# Patient Record
Sex: Female | Born: 1960 | Race: Black or African American | Hispanic: No | Marital: Married | Smoking: Never smoker
Health system: Southern US, Community
[De-identification: ages and names within clinical notes are randomized; demographics above are authoritative.]

## PROBLEM LIST (undated history)

## (undated) DIAGNOSIS — M2550 Pain in unspecified joint: Secondary | ICD-10-CM

## (undated) DIAGNOSIS — F419 Anxiety disorder, unspecified: Secondary | ICD-10-CM

## (undated) DIAGNOSIS — E119 Type 2 diabetes mellitus without complications: Secondary | ICD-10-CM

## (undated) DIAGNOSIS — M7989 Other specified soft tissue disorders: Secondary | ICD-10-CM

## (undated) DIAGNOSIS — G4730 Sleep apnea, unspecified: Secondary | ICD-10-CM

## (undated) DIAGNOSIS — M797 Fibromyalgia: Secondary | ICD-10-CM

## (undated) DIAGNOSIS — M51369 Other intervertebral disc degeneration, lumbar region without mention of lumbar back pain or lower extremity pain: Secondary | ICD-10-CM

## (undated) DIAGNOSIS — G4700 Insomnia, unspecified: Secondary | ICD-10-CM

## (undated) DIAGNOSIS — M1990 Unspecified osteoarthritis, unspecified site: Secondary | ICD-10-CM

## (undated) DIAGNOSIS — M549 Dorsalgia, unspecified: Secondary | ICD-10-CM

## (undated) DIAGNOSIS — Z973 Presence of spectacles and contact lenses: Secondary | ICD-10-CM

## (undated) DIAGNOSIS — I890 Lymphedema, not elsewhere classified: Secondary | ICD-10-CM

## (undated) DIAGNOSIS — K219 Gastro-esophageal reflux disease without esophagitis: Secondary | ICD-10-CM

## (undated) DIAGNOSIS — M5126 Other intervertebral disc displacement, lumbar region: Secondary | ICD-10-CM

## (undated) DIAGNOSIS — I10 Essential (primary) hypertension: Secondary | ICD-10-CM

## (undated) DIAGNOSIS — M4800 Spinal stenosis, site unspecified: Secondary | ICD-10-CM

## (undated) DIAGNOSIS — R002 Palpitations: Secondary | ICD-10-CM

## (undated) DIAGNOSIS — M5136 Other intervertebral disc degeneration, lumbar region: Secondary | ICD-10-CM

## (undated) HISTORY — DX: Dorsalgia, unspecified: M54.9

## (undated) HISTORY — PX: MRI: SHX5353

## (undated) HISTORY — DX: Other specified soft tissue disorders: M79.89

## (undated) HISTORY — DX: Other intervertebral disc degeneration, lumbar region without mention of lumbar back pain or lower extremity pain: M51.369

## (undated) HISTORY — DX: Other intervertebral disc displacement, lumbar region: M51.26

## (undated) HISTORY — PX: OTHER SURGICAL HISTORY: SHX169

## (undated) HISTORY — DX: Lymphedema, not elsewhere classified: I89.0

## (undated) HISTORY — DX: Pain in unspecified joint: M25.50

## (undated) HISTORY — PX: ABDOMINAL HYSTERECTOMY: SHX81

## (undated) HISTORY — PX: LUMBAR LAMINECTOMY: SHX95

## (undated) HISTORY — DX: Spinal stenosis, site unspecified: M48.00

## (undated) HISTORY — PX: GASTRIC BYPASS: SHX52

## (undated) HISTORY — DX: Other intervertebral disc degeneration, lumbar region: M51.36

## (undated) HISTORY — PX: ANAL FISSURE REPAIR: SHX2312

---

## 1999-06-03 ENCOUNTER — Encounter: Admission: RE | Admit: 1999-06-03 | Discharge: 1999-09-01 | Payer: Self-pay | Admitting: *Deleted

## 2000-03-15 ENCOUNTER — Encounter: Admission: RE | Admit: 2000-03-15 | Discharge: 2000-06-13 | Payer: Self-pay | Admitting: Surgical Oncology

## 2000-08-21 ENCOUNTER — Encounter: Admission: RE | Admit: 2000-08-21 | Discharge: 2000-11-19 | Payer: Self-pay | Admitting: Surgical Oncology

## 2003-04-04 ENCOUNTER — Ambulatory Visit (HOSPITAL_COMMUNITY): Admission: RE | Admit: 2003-04-04 | Discharge: 2003-04-04 | Payer: Self-pay | Admitting: General Surgery

## 2003-10-30 ENCOUNTER — Ambulatory Visit (HOSPITAL_COMMUNITY): Admission: RE | Admit: 2003-10-30 | Discharge: 2003-10-30 | Payer: Self-pay | Admitting: General Surgery

## 2006-10-03 ENCOUNTER — Encounter: Admission: RE | Admit: 2006-10-03 | Discharge: 2006-10-03 | Payer: Self-pay | Admitting: Internal Medicine

## 2007-02-01 HISTORY — PX: BUNIONECTOMY: SHX129

## 2007-04-27 ENCOUNTER — Encounter: Admission: RE | Admit: 2007-04-27 | Discharge: 2007-04-27 | Payer: Self-pay | Admitting: Neurosurgery

## 2010-06-18 NOTE — Op Note (Signed)
Angel Watkins, Angel Watkins     ACCOUNT NO.:  [id number]   MEDICAL RECORD NO.:  [id number]          PATIENT TYPE:  AMB   LOCATION:  DAY                          FACILITY:  [facility]   PHYSICIAN:  Ollen Gross. Vernell Morgans, M.D. DATE OF BIRTH:  04-Dec-1960   DATE OF PROCEDURE:  10/30/2003  DATE OF DISCHARGE:  10/30/2003                                 OPERATIVE REPORT   PREOPERATIVE DIAGNOSIS:  Persistent anal fistula.   POSTOPERATIVE DIAGNOSIS:  Persistent anal fistula.   PROCEDURE:  Examination under anesthesia, fistulotomy and placement of new  seaton.   SURGEON:  Ollen Gross. Carolynne Edouard, M.D.   ANESTHESIA:  General endotracheal.   DESCRIPTION OF PROCEDURE:  After informed consent was obtained, the patient  was brought to the operating room, placed in the supine position on the  operating table. After adequate induction of general anesthesia, the patient  was placed in the lithotomy position and the perirectal area was prepped  with Betadine, draped in the usual sterile manner.   The rectum was examined with an anoscope.  There were a couple of small  fissures that were able to be opened sharply with the electrocautery.  The  old seaton was still in place. It appeared as though the seaton was still  surrounding a small portion of the internal muscle, but there was scar  tissue that made it difficult to determine this exactly.  Because of this,  the fistula tracts were cleaned up with the cautery and a curet.  A new  seaton using a blue rubber [place] was placed snugly around what was left  of the fistula tract and anchored at that point with a 0-silk tie. The area  was then infiltrated with 0.25% Marcaine and sterile dressings were applied.  The patient tolerated the procedure well. At the end of the case, all  sponge, instrument and needle counts were correct.  The patient was then  awakened and taken to the recovery room in stable condition.      PST/MEDQ  D:  11/02/2003  T:  11/02/2003   Job:  [id number]

## 2010-06-18 NOTE — Op Note (Signed)
[name], Angel Watkins                 ACCOUNT NO.:  [id number]   MEDICAL RECORD NO.:  [id number]                   PATIENT TYPE:  AMB   LOCATION:  DAY                                  FACILITY:  [facility]   PHYSICIAN:  Ollen Gross. Vernell Morgans, M.D.              DATE OF BIRTH:  02-Oct-1960   DATE OF PROCEDURE:  04/04/2003  DATE OF DISCHARGE:  04/04/2003                                 OPERATIVE REPORT   PREOPERATIVE DIAGNOSIS:  Anal fistula.   POSTOPERATIVE DIAGNOSIS:  Anal fistula.   PROCEDURE:  1. Exam under anesthesia.  2. Debridement of fistula tract.  3. Placement of seton.   SURGEON:  Dr. Carolynne Edouard.   ANESTHESIA:  General endotracheal.   DESCRIPTION OF PROCEDURE:  After informed consent was obtained, the patient  was brought to the operating room and placed in the supine position on the  operating room table.  After adequate induction of general anesthesia, the  patient is placed in lithotomy position.  Her perirectal area was prepped  with Betadine and draped in the usual sterile manner.  There was an opening  posteriorly and a palpable cord.  This tract was probed with a silver probe,  and a connection with the rectum was able to be appreciated.  This fistula  tract did seem to go fairly deep to the sphincter muscles.  The opening on  the skin was widened sharply with a 15 blade knife and electrocautery.  The  fistula tract was curetted to remove any epithelial tissue.  A looped #1  Prolene was then placed around the fistula tract and tied down, and the  tails were cut long to leave the ability to pull on these stitches down the  road.  Hemostasis was achieved using the electrocautery.  No other  abnormalities were identified.  The area was then infiltrated with 0.25%  Marcaine with epinephrine.  Gelfoam was placed in the rectum, and sterile  dressings were applied.  The patient tolerated the procedure well.  At the  end of the case, all needle, sponge, and instrument counts  were correct.  The patient was then awakened and taken to the recovery room in stable  condition.                                               Ollen Gross. Vernell Morgans, M.D.    PST/MEDQ  D:  04/06/2003  T:  04/06/2003  Job:  [id number]

## 2014-05-05 ENCOUNTER — Other Ambulatory Visit: Payer: Self-pay | Admitting: Neurosurgery

## 2014-05-05 DIAGNOSIS — M5412 Radiculopathy, cervical region: Secondary | ICD-10-CM

## 2014-05-08 ENCOUNTER — Ambulatory Visit
Admission: RE | Admit: 2014-05-08 | Discharge: 2014-05-08 | Disposition: A | Discharge status: Home / Self Care | Payer: Self-pay | Source: Ambulatory Visit | Attending: Neurosurgery | Admitting: Neurosurgery

## 2014-05-08 ENCOUNTER — Ambulatory Visit
Admission: RE | Admit: 2014-05-08 | Discharge: 2014-05-08 | Disposition: A | Discharge status: Home / Self Care | Payer: 59 | Source: Ambulatory Visit | Attending: Neurosurgery | Admitting: Neurosurgery

## 2014-05-08 DIAGNOSIS — M5412 Radiculopathy, cervical region: Secondary | ICD-10-CM

## 2014-05-08 MED ORDER — IOHEXOL 300 MG/ML  SOLN
10.0000 mL | Freq: Once | INTRAMUSCULAR | Status: AC | PRN
  Administered 2014-05-08: 10 mL via INTRATHECAL

## 2014-05-08 MED ORDER — DIAZEPAM 5 MG PO TABS
5.0000 mg | ORAL_TABLET | Freq: Once | ORAL | Status: AC
  Administered 2014-05-08: 5 mg via ORAL

## 2014-05-08 NOTE — Discharge Instructions (Signed)

## 2014-06-01 ENCOUNTER — Emergency Department (HOSPITAL_BASED_OUTPATIENT_CLINIC_OR_DEPARTMENT_OTHER)
Admission: EM | Admit: 2014-06-01 | Discharge: 2014-06-01 | Disposition: A | Discharge status: Home / Self Care | Payer: 59 | Attending: Emergency Medicine | Admitting: Emergency Medicine

## 2014-06-01 ENCOUNTER — Encounter (HOSPITAL_BASED_OUTPATIENT_CLINIC_OR_DEPARTMENT_OTHER): Payer: Self-pay | Admitting: *Deleted

## 2014-06-01 ENCOUNTER — Emergency Department (HOSPITAL_BASED_OUTPATIENT_CLINIC_OR_DEPARTMENT_OTHER): Payer: 59

## 2014-06-01 DIAGNOSIS — S0990XA Unspecified injury of head, initial encounter: Secondary | ICD-10-CM | POA: Insufficient documentation

## 2014-06-01 DIAGNOSIS — I10 Essential (primary) hypertension: Secondary | ICD-10-CM | POA: Insufficient documentation

## 2014-06-01 DIAGNOSIS — S59912A Unspecified injury of left forearm, initial encounter: Secondary | ICD-10-CM | POA: Diagnosis not present

## 2014-06-01 DIAGNOSIS — Z79899 Other long term (current) drug therapy: Secondary | ICD-10-CM | POA: Diagnosis not present

## 2014-06-01 DIAGNOSIS — S39012A Strain of muscle, fascia and tendon of lower back, initial encounter: Secondary | ICD-10-CM

## 2014-06-01 DIAGNOSIS — S199XXA Unspecified injury of neck, initial encounter: Secondary | ICD-10-CM | POA: Diagnosis present

## 2014-06-01 DIAGNOSIS — Y998 Other external cause status: Secondary | ICD-10-CM | POA: Diagnosis not present

## 2014-06-01 DIAGNOSIS — Z791 Long term (current) use of non-steroidal anti-inflammatories (NSAID): Secondary | ICD-10-CM | POA: Diagnosis not present

## 2014-06-01 DIAGNOSIS — Z8669 Personal history of other diseases of the nervous system and sense organs: Secondary | ICD-10-CM | POA: Insufficient documentation

## 2014-06-01 DIAGNOSIS — Z9889 Other specified postprocedural states: Secondary | ICD-10-CM | POA: Diagnosis not present

## 2014-06-01 DIAGNOSIS — S4992XA Unspecified injury of left shoulder and upper arm, initial encounter: Secondary | ICD-10-CM | POA: Insufficient documentation

## 2014-06-01 DIAGNOSIS — S161XXA Strain of muscle, fascia and tendon at neck level, initial encounter: Secondary | ICD-10-CM | POA: Insufficient documentation

## 2014-06-01 DIAGNOSIS — S8990XA Unspecified injury of unspecified lower leg, initial encounter: Secondary | ICD-10-CM | POA: Insufficient documentation

## 2014-06-01 DIAGNOSIS — Y92410 Unspecified street and highway as the place of occurrence of the external cause: Secondary | ICD-10-CM | POA: Diagnosis not present

## 2014-06-01 DIAGNOSIS — Z880 Allergy status to penicillin: Secondary | ICD-10-CM | POA: Insufficient documentation

## 2014-06-01 DIAGNOSIS — Y9389 Activity, other specified: Secondary | ICD-10-CM | POA: Diagnosis not present

## 2014-06-01 DIAGNOSIS — E119 Type 2 diabetes mellitus without complications: Secondary | ICD-10-CM | POA: Diagnosis not present

## 2014-06-01 HISTORY — DX: Sleep apnea, unspecified: G47.30

## 2014-06-01 HISTORY — DX: Essential (primary) hypertension: I10

## 2014-06-01 HISTORY — DX: Type 2 diabetes mellitus without complications: E11.9

## 2014-06-01 NOTE — ED Notes (Signed)
MD at bedside. 
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
## 2014-06-01 NOTE — ED Notes (Signed)
Patient transported to CT 

## 2014-06-01 NOTE — Discharge Instructions (Signed)
Seizure pain patch as needed. Follow-up with your neurosurgeon if symptoms don't improve over the next few days. Results of the head CT neck CT and CT of the lumbar back without any acute injuries. From a bony standpoint.
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
## 2017-10-09 ENCOUNTER — Other Ambulatory Visit (HOSPITAL_COMMUNITY): Payer: Self-pay | Admitting: Neurological Surgery

## 2017-10-09 DIAGNOSIS — G959 Disease of spinal cord, unspecified: Secondary | ICD-10-CM

## 2017-10-09 DIAGNOSIS — M5416 Radiculopathy, lumbar region: Secondary | ICD-10-CM

## 2017-11-09 ENCOUNTER — Ambulatory Visit (HOSPITAL_COMMUNITY): Payer: 59

## 2017-11-22 NOTE — Progress Notes (Signed)
Left voice message with Shanda Bumps, Surgical Coordinator, to make MD aware that a new H& P is needed ( previous H&P scanned into media is outdated).

## 2017-11-23 ENCOUNTER — Ambulatory Visit (HOSPITAL_COMMUNITY): Payer: 59

## 2017-12-13 ENCOUNTER — Other Ambulatory Visit: Payer: Self-pay

## 2017-12-13 ENCOUNTER — Encounter (HOSPITAL_COMMUNITY): Payer: Self-pay | Admitting: *Deleted

## 2017-12-13 NOTE — Progress Notes (Signed)
Pre-op phone call complete.  Pt denies any CP, SOB, fever or cough.  Does not see a cardiologist.  States last Hbg A1C was 6.2 in September.  Fasting CBG usually 112-120.  Instructed to check CBG in AM, if <70 treat with 1/2 cup of clear juice (apple or cranberry), glucose gel or tablets, and then recheck after 15 minutes. No meds tomorrow morning, NPO after MN.  Verbalized understanding of above.

## 2017-12-14 ENCOUNTER — Ambulatory Visit (HOSPITAL_COMMUNITY): Payer: 59 | Admitting: Certified Registered Nurse Anesthetist

## 2017-12-14 ENCOUNTER — Ambulatory Visit (HOSPITAL_COMMUNITY)
Admission: RE | Admit: 2017-12-14 | Discharge: 2017-12-14 | Disposition: A | Discharge status: Home / Self Care | Payer: 59 | Source: Ambulatory Visit | Attending: Neurological Surgery | Admitting: Neurological Surgery

## 2017-12-14 ENCOUNTER — Encounter (HOSPITAL_COMMUNITY): Admission: RE | Disposition: A | Discharge status: Home / Self Care | Payer: Self-pay | Source: Ambulatory Visit

## 2017-12-14 DIAGNOSIS — M5416 Radiculopathy, lumbar region: Secondary | ICD-10-CM

## 2017-12-14 DIAGNOSIS — G959 Disease of spinal cord, unspecified: Secondary | ICD-10-CM

## 2017-12-14 DIAGNOSIS — M48061 Spinal stenosis, lumbar region without neurogenic claudication: Secondary | ICD-10-CM | POA: Insufficient documentation

## 2017-12-14 DIAGNOSIS — M4802 Spinal stenosis, cervical region: Secondary | ICD-10-CM | POA: Diagnosis not present

## 2017-12-14 DIAGNOSIS — M5003 Cervical disc disorder with myelopathy, cervicothoracic region: Secondary | ICD-10-CM | POA: Insufficient documentation

## 2017-12-14 DIAGNOSIS — M5116 Intervertebral disc disorders with radiculopathy, lumbar region: Secondary | ICD-10-CM | POA: Insufficient documentation

## 2017-12-14 DIAGNOSIS — M2578 Osteophyte, vertebrae: Secondary | ICD-10-CM | POA: Diagnosis not present

## 2017-12-14 HISTORY — DX: Anxiety disorder, unspecified: F41.9

## 2017-12-14 HISTORY — PX: RADIOLOGY WITH ANESTHESIA: SHX6223

## 2017-12-14 HISTORY — DX: Unspecified osteoarthritis, unspecified site: M19.90

## 2017-12-14 LAB — BASIC METABOLIC PANEL
Anion gap: 8 (ref 5–15)
BUN: 14 mg/dL (ref 6–20)
CHLORIDE: 106 mmol/L (ref 98–111)
CO2: 25 mmol/L (ref 22–32)
CREATININE: 0.84 mg/dL (ref 0.44–1.00)
Calcium: 8.9 mg/dL (ref 8.9–10.3)
Glucose, Bld: 131 mg/dL — ABNORMAL HIGH (ref 70–99)
Potassium: 3.5 mmol/L (ref 3.5–5.1)
SODIUM: 139 mmol/L (ref 135–145)

## 2017-12-14 LAB — CBC
HCT: 42.8 % (ref 36.0–46.0)
Hemoglobin: 12.8 g/dL (ref 12.0–15.0)
MCH: 28.5 pg (ref 26.0–34.0)
MCHC: 29.9 g/dL — AB (ref 30.0–36.0)
MCV: 95.3 fL (ref 80.0–100.0)
NRBC: 0 % (ref 0.0–0.2)
PLATELETS: 315 10*3/uL (ref 150–400)
RBC: 4.49 MIL/uL (ref 3.87–5.11)
RDW: 12.3 % (ref 11.5–15.5)
WBC: 5.4 10*3/uL (ref 4.0–10.5)

## 2017-12-14 LAB — GLUCOSE, CAPILLARY
GLUCOSE-CAPILLARY: 107 mg/dL — AB (ref 70–99)
GLUCOSE-CAPILLARY: 104 mg/dL — AB (ref 70–99)

## 2017-12-14 SURGERY — MRI WITH ANESTHESIA
Anesthesia: General

## 2017-12-14 MED ORDER — LACTATED RINGERS IV SOLN
INTRAVENOUS | Status: DC
  Administered 2017-12-14: 08:00:00 via INTRAVENOUS

## 2017-12-14 MED ORDER — ROCURONIUM BROMIDE 50 MG/5ML IV SOSY
PREFILLED_SYRINGE | INTRAVENOUS | Status: DC | PRN
  Administered 2017-12-14: 50 mg via INTRAVENOUS

## 2017-12-14 MED ORDER — DEXAMETHASONE SODIUM PHOSPHATE 10 MG/ML IJ SOLN
INTRAMUSCULAR | Status: DC | PRN
  Administered 2017-12-14: 10 mg via INTRAVENOUS

## 2017-12-14 MED ORDER — SUGAMMADEX SODIUM 200 MG/2ML IV SOLN
INTRAVENOUS | Status: DC | PRN
  Administered 2017-12-14: 300 mg via INTRAVENOUS

## 2017-12-14 MED ORDER — FENTANYL CITRATE (PF) 250 MCG/5ML IJ SOLN
INTRAMUSCULAR | Status: DC | PRN
  Administered 2017-12-14: 100 ug via INTRAVENOUS

## 2017-12-14 MED ORDER — MIDAZOLAM HCL 2 MG/2ML IJ SOLN
INTRAMUSCULAR | Status: DC | PRN
  Administered 2017-12-14 (×2): 2 mg via INTRAVENOUS

## 2017-12-14 MED ORDER — ONDANSETRON HCL 4 MG/2ML IJ SOLN
INTRAMUSCULAR | Status: DC | PRN
  Administered 2017-12-14: 4 mg via INTRAVENOUS

## 2017-12-14 MED ORDER — PROPOFOL 10 MG/ML IV BOLUS
INTRAVENOUS | Status: DC | PRN
  Administered 2017-12-14: 120 mg via INTRAVENOUS

## 2017-12-14 NOTE — Transfer of Care (Signed)
Immediate Anesthesia Transfer of Care Note  Patient: Angel Watkins  Procedure(s) Performed: MRI WITH ANESTHESIA, LUMBAR SPINE WITHOUT CONTRAST, CERVICAL SPINE WITHOUT CONTRAST (N/A )  Patient Location: PACU  Anesthesia Type:General  Level of Consciousness: awake, alert  and oriented  Airway & Oxygen Therapy: Patient Spontanous Breathing and Patient connected to face mask oxygen  Post-op Assessment: Report given to RN, Post -op Vital signs reviewed and stable and Patient moving all extremities X 4  Post vital signs: Reviewed and stable  Last Vitals:  Vitals Value Taken Time  BP 112/63 12/14/2017 11:50 AM  Temp 36.4 C 12/14/2017 11:50 AM  Pulse 66 12/14/2017 11:54 AM  Resp 13 12/14/2017 11:54 AM  SpO2 95 % 12/14/2017 11:54 AM  Vitals shown include unvalidated device data.  Last Pain:  Vitals:   12/14/17 1150  TempSrc:   PainSc: 0-No pain         Complications: No apparent anesthesia complications

## 2017-12-14 NOTE — Anesthesia Procedure Notes (Signed)
Procedure Name: Intubation Date/Time: 12/14/2017 10:31 AM Performed by: Kathryne Hitch, CRNA Pre-anesthesia Checklist: Patient identified, Emergency Drugs available, Suction available, Patient being monitored and Timeout performed Patient Re-evaluated:Patient Re-evaluated prior to induction Oxygen Delivery Method: Circle system utilized Preoxygenation: Pre-oxygenation with 100% oxygen Induction Type: IV induction Ventilation: Oral airway inserted - appropriate to patient size and Two handed mask ventilation required Laryngoscope Size: Mac and 4 Grade View: Grade II Tube type: Oral Tube size: 7.0 mm Number of attempts: 1 Airway Equipment and Method: Stylet Placement Confirmation: ETT inserted through vocal cords under direct vision,  positive ETCO2 and breath sounds checked- equal and bilateral Secured at: 21 cm Tube secured with: Tape Dental Injury: Teeth and Oropharynx as per pre-operative assessment

## 2017-12-14 NOTE — Anesthesia Preprocedure Evaluation (Signed)
Anesthesia Evaluation  Patient identified by MRN, date of birth, ID band Patient awake    Reviewed: Allergy & Precautions, NPO status , Patient's Chart, lab work & pertinent test results  History of Anesthesia Complications Negative for: history of anesthetic complications  Airway Mallampati: II  TM Distance: >3 FB Neck ROM: Full    Dental  (+) Teeth Intact   Pulmonary neg shortness of breath, sleep apnea , neg COPD, neg recent URI,    breath sounds clear to auscultation       Cardiovascular hypertension, Pt. on medications (-) angina(-) Past MI and (-) CHF  Rhythm:Regular     Neuro/Psych PSYCHIATRIC DISORDERS Anxiety negative neurological ROS     GI/Hepatic negative GI ROS, Neg liver ROS,   Endo/Other  diabetes, Type 2, Oral Hypoglycemic AgentsMorbid obesity  Renal/GU negative Renal ROS     Musculoskeletal  (+) Arthritis ,   Abdominal   Peds  Hematology negative hematology ROS (+)   Anesthesia Other Findings   Reproductive/Obstetrics                             Anesthesia Physical Anesthesia Plan  ASA: II  Anesthesia Plan: General   Post-op Pain Management:    Induction: Intravenous  PONV Risk Score and Plan: 3 and Ondansetron and Dexamethasone  Airway Management Planned: LMA and Oral ETT  Additional Equipment: None  Intra-op Plan:   Post-operative Plan: Extubation in OR  Informed Consent: I have reviewed the patients History and Physical, chart, labs and discussed the procedure including the risks, benefits and alternatives for the proposed anesthesia with the patient or authorized representative who has indicated his/her understanding and acceptance.   Dental advisory given  Plan Discussed with: CRNA and Surgeon  Anesthesia Plan Comments:         Anesthesia Quick Evaluation

## 2017-12-15 ENCOUNTER — Encounter (HOSPITAL_COMMUNITY): Payer: Self-pay | Admitting: Radiology

## 2017-12-18 NOTE — Anesthesia Postprocedure Evaluation (Signed)
Anesthesia Post Note  Patient: Angel Watkins  Procedure(s) Performed: MRI WITH ANESTHESIA, LUMBAR SPINE WITHOUT CONTRAST, CERVICAL SPINE WITHOUT CONTRAST (N/A )     Patient location during evaluation: PACU Anesthesia Type: General Level of consciousness: awake and alert Pain management: pain level controlled Vital Signs Assessment: post-procedure vital signs reviewed and stable Respiratory status: spontaneous breathing, nonlabored ventilation, respiratory function stable and patient connected to nasal cannula oxygen Cardiovascular status: blood pressure returned to baseline and stable Postop Assessment: no apparent nausea or vomiting Anesthetic complications: no    Last Vitals:  Vitals:   12/14/17 1234 12/14/17 1249  BP: (!) 99/58 (!) 101/55  Pulse: 62 60  Resp: 13 18  Temp:  (!) 36.4 C  SpO2: 98% 97%    Last Pain:  Vitals:   12/14/17 1249  TempSrc:   PainSc: 0-No pain                 Mette Southgate

## 2018-09-21 HISTORY — PX: COLONOSCOPY: SHX174

## 2018-10-31 ENCOUNTER — Other Ambulatory Visit: Payer: Self-pay | Admitting: Neurological Surgery

## 2018-11-28 NOTE — Pre-Procedure Instructions (Signed)
[facility] STORE [place], [address] [place] AT [address] [place] [address] Phone: [phone] Fax: [phone]    Your procedure is scheduled on Mon., Nov. 2, 2020 from 8:00AM-10:33AM  Report to [facility] Entrance "A" at 6:00AM  Call this number if you have problems the morning of surgery:  [phone]   Remember:  Do not eat or drink after midnight on Nov. 1st    Take these medicines the morning of surgery with A SIP OF WATER: Atorvastatin (LIPITOR) Zonisamide (ZONEGRAN)   As of today, stop taking all Aspirin (unless instructed by your doctor) and Other Aspirin containing products, Vitamins, Fish oils, and Herbal medications. Also stop all NSAIDS i.e. Advil, Ibuprofen, Motrin, Aleve, Anaprox, Naproxen, BC, Goody Powders, and all Supplements.   . Do not take MetFORMIN (GLUCOPHAGE)  the morning of surgery.  How to Manage Your Diabetes Before and After Surgery  Why is it important to control my blood sugar before and after surgery? . Improving blood sugar levels before and after surgery helps healing and can limit problems. . A way of improving blood sugar control is eating a healthy diet by: o  Eating less sugar and carbohydrates o  Increasing activity/exercise o  Talking with your doctor about reaching your blood sugar goals . High blood sugars (greater than 180 mg/dL) can raise your risk of infections and slow your recovery, so you will need to focus on controlling your diabetes during the weeks before surgery. . Make sure that the doctor who takes care of your diabetes knows about your planned surgery including the date and location.  How do I manage my blood sugar before surgery? . Check your blood sugar at least 4 times a day, starting 2 days before surgery, to make sure that the level is not too high or low. o Check your blood sugar the morning of your surgery when you wake up and every 2 hours until you get to the  Short Stay unit. . If your blood sugar is less than 70 mg/dL, you will need to treat for low blood sugar: o Do not take insulin. o Treat a low blood sugar (less than 70 mg/dL) with  cup of clear juice (cranberry or apple), 4 glucose tablets, OR glucose gel. Recheck blood sugar in 15 minutes after treatment (to make sure it is greater than 70 mg/dL). If your blood sugar is not greater than 70 mg/dL on recheck, call [phone] o  for further instructions. . If your CBG is greater than 220 mg/dL, inform the staff upon arrival to Short Stay.  . If you are admitted to the hospital after surgery: o Your blood sugar will be checked by the staff and you will probably be given insulin after surgery (instead of oral diabetes medicines) to make sure you have good blood sugar levels. o The goal for blood sugar control after surgery is 80-180 mg/dL.  Reviewed and Endorsed by [facility] Patient Education Committee, August 2015  No Smoking of any kind, Tobacco, or Alcohol products 24 hours prior to your procedure. If you use a Cpap at night, you may bring all equipment with you the day of surgery.    Special instructions:  [place]- Preparing For Surgery  Before surgery, you can play an important role. Because skin is not sterile, your skin needs to be as free of germs as possible. You can reduce the number of germs on your skin  by washing with CHG (chlorahexidine gluconate) Soap before surgery.  CHG is an antiseptic cleaner which kills germs and bonds with the skin to continue killing germs even after washing.    Oral Hygiene is also important to reduce your risk of infection.  Remember - BRUSH YOUR TEETH THE MORNING OF SURGERY WITH YOUR REGULAR TOOTHPASTE  Please do not use if you have an allergy to CHG or antibacterial soaps. If your skin becomes reddened/irritated stop using the CHG.  Do not shave (including legs and underarms) for at least 48 hours prior to first CHG shower. It is OK to shave  your face.  Please follow these instructions carefully.   1. Shower the NIGHT BEFORE SURGERY and the MORNING OF SURGERY with CHG.   2. If you chose to wash your hair, wash your hair first as usual with your normal shampoo.  3. After you shampoo, rinse your hair and body thoroughly to remove the shampoo.  4. Use CHG as you would any other liquid soap. You can apply CHG directly to the skin and wash gently with a scrungie or a clean washcloth.   5. Apply the CHG Soap to your body ONLY FROM THE NECK DOWN.  Do not use on open wounds or open sores. Avoid contact with your eyes, ears, mouth and genitals (private parts). Wash Face and genitals (private parts)  with your normal soap.  6. Wash thoroughly, paying special attention to the area where your surgery will be performed.  7. Thoroughly rinse your body with warm water from the neck down.  8. DO NOT shower/wash with your normal soap after using and rinsing off the CHG Soap.  9. Pat yourself dry with a CLEAN TOWEL.  10. Wear CLEAN PAJAMAS to bed the night before surgery, wear comfortable clothes the morning of surgery  11. Place CLEAN SHEETS on your bed the night of your first shower and DO NOT SLEEP WITH PETS.   Day of Surgery:             Remember to brush your teeth WITH YOUR REGULAR TOOTHPASTE.   Do not wear jewelry, make-up or nail polish.  Do not wear lotions, powders, or perfumes, or deodorant.  Do not shave 48 hours prior to surgery.    Do not bring valuables to the hospital.  [facility] is not responsible for any belongings or valuables.  Contacts, dentures or bridgework may not be worn into surgery.    For patients admitted to the hospital, discharge time will be determined by your treatment team.  Patients discharged the day of surgery will not be allowed to drive home, and someone age 56 and over needs to stay with them for 24 hours.  Please wear clean clothes to the hospital/surgery center.    Please read over the  following fact sheets that you were given.

## 2018-11-29 ENCOUNTER — Encounter (HOSPITAL_COMMUNITY): Payer: Self-pay

## 2018-11-29 ENCOUNTER — Other Ambulatory Visit (HOSPITAL_COMMUNITY)
Admission: RE | Admit: 2018-11-29 | Discharge: 2018-11-29 | Disposition: A | Discharge status: Home / Self Care | Payer: 59 | Source: Ambulatory Visit | Attending: Neurological Surgery | Admitting: Neurological Surgery

## 2018-11-29 ENCOUNTER — Other Ambulatory Visit: Payer: Self-pay

## 2018-11-29 ENCOUNTER — Encounter (HOSPITAL_COMMUNITY)
Admission: RE | Admit: 2018-11-29 | Discharge: 2018-11-29 | Disposition: A | Discharge status: Home / Self Care | Payer: 59 | Source: Ambulatory Visit | Attending: Neurological Surgery | Admitting: Neurological Surgery

## 2018-11-29 DIAGNOSIS — Z20828 Contact with and (suspected) exposure to other viral communicable diseases: Secondary | ICD-10-CM | POA: Diagnosis not present

## 2018-11-29 DIAGNOSIS — I10 Essential (primary) hypertension: Secondary | ICD-10-CM | POA: Diagnosis not present

## 2018-11-29 DIAGNOSIS — Z01818 Encounter for other preprocedural examination: Secondary | ICD-10-CM | POA: Insufficient documentation

## 2018-11-29 HISTORY — DX: Fibromyalgia: M79.7

## 2018-11-29 LAB — BASIC METABOLIC PANEL
Anion gap: 9 (ref 5–15)
BUN: 10 mg/dL (ref 6–20)
CO2: 25 mmol/L (ref 22–32)
Calcium: 9.2 mg/dL (ref 8.9–10.3)
Chloride: 105 mmol/L (ref 98–111)
Creatinine, Ser: 0.81 mg/dL (ref 0.44–1.00)
GFR calc Af Amer: >60 mL/min (ref >60)
GFR calc non Af Amer: >60 mL/min (ref >60)
Glucose, Bld: 147 mg/dL — ABNORMAL HIGH (ref 70–99)
Potassium: 4 mmol/L (ref 3.5–5.1)
Sodium: 139 mmol/L (ref 135–145)

## 2018-11-29 LAB — CBC
HCT: 42.8 % (ref 36.0–46.0)
Hemoglobin: 13.5 g/dL (ref 12.0–15.0)
MCH: 30.1 pg (ref 26.0–34.0)
MCHC: 31.5 g/dL (ref 30.0–36.0)
MCV: 95.3 fL (ref 80.0–100.0)
Platelets: 287 10*3/uL (ref 150–400)
RBC: 4.49 MIL/uL (ref 3.87–5.11)
RDW: 12.4 % (ref 11.5–15.5)
WBC: 5.2 10*3/uL (ref 4.0–10.5)
nRBC: 0 % (ref 0.0–0.2)

## 2018-11-29 LAB — SURGICAL PCR SCREEN
MRSA, PCR: NEGATIVE
Staphylococcus aureus: NEGATIVE

## 2018-11-29 LAB — GLUCOSE, CAPILLARY: Glucose-Capillary: 140 mg/dL — ABNORMAL HIGH (ref 70–99)

## 2018-11-29 LAB — HEMOGLOBIN A1C
Hgb A1c MFr Bld: 6.2 % — ABNORMAL HIGH (ref 4.8–5.6)
Mean Plasma Glucose: 131.24 mg/dL

## 2018-11-29 LAB — TYPE AND SCREEN
ABO/RH(D): B POS
Antibody Screen: NEGATIVE

## 2018-11-29 LAB — ABO/RH: ABO/RH(D): B POS

## 2018-11-29 NOTE — Progress Notes (Signed)
PCP:  Sofie Rower, FNP Cardiologist: Denies  EKG:  11/29/18 CXR:  NA ECHO: denies Stress Test:  denies Cardiac Cath:  denies  Fasting Blood Sugar-  Patient does not check BG at home.  Pt states she does not know her BG range. Checks Blood Sugar__0_ times a day  Sleep Apnea:  Yes CPAP:  Yes.  Informed patient to bring CPAP DOS.  Covid testing scheduled 11/29/18  Patient denies shortness of breath, fever, cough, and chest pain at PAT appointment.  Patient verbalized understanding of instructions provided today at the PAT appointment.  Patient asked to review instructions at home and day of surgery.

## 2018-11-30 LAB — NOVEL CORONAVIRUS, NAA (HOSP ORDER, SEND-OUT TO REF LAB; TAT 18-24 HRS): SARS-CoV-2, NAA: NOT DETECTED

## 2018-11-30 MED ORDER — VANCOMYCIN HCL 10 G IV SOLR
1500.0000 mg | INTRAVENOUS | Status: AC
  Administered 2018-12-03 (×2): 1500 mg via INTRAVENOUS
  Filled 2018-11-30 (×2): qty 1500

## 2018-12-03 ENCOUNTER — Other Ambulatory Visit: Payer: Self-pay

## 2018-12-03 ENCOUNTER — Observation Stay (HOSPITAL_COMMUNITY)
Admission: RE | Admit: 2018-12-03 | Discharge: 2018-12-04 | Disposition: A | Discharge status: Home / Self Care | Payer: 59 | Attending: Neurological Surgery | Admitting: Neurological Surgery

## 2018-12-03 ENCOUNTER — Ambulatory Visit (HOSPITAL_COMMUNITY): Payer: 59

## 2018-12-03 ENCOUNTER — Ambulatory Visit (HOSPITAL_COMMUNITY): Payer: 59 | Admitting: Physician Assistant

## 2018-12-03 ENCOUNTER — Ambulatory Visit (HOSPITAL_COMMUNITY): Payer: 59 | Admitting: Anesthesiology

## 2018-12-03 ENCOUNTER — Encounter (HOSPITAL_COMMUNITY): Payer: Self-pay | Admitting: *Deleted

## 2018-12-03 ENCOUNTER — Encounter (HOSPITAL_COMMUNITY)
Admission: RE | Disposition: A | Discharge status: Home / Self Care | Payer: Self-pay | Source: Home / Self Care | Attending: Neurological Surgery

## 2018-12-03 DIAGNOSIS — I10 Essential (primary) hypertension: Secondary | ICD-10-CM | POA: Insufficient documentation

## 2018-12-03 DIAGNOSIS — Z7984 Long term (current) use of oral hypoglycemic drugs: Secondary | ICD-10-CM | POA: Diagnosis not present

## 2018-12-03 DIAGNOSIS — Z6841 Body Mass Index (BMI) 40.0 and over, adult: Secondary | ICD-10-CM | POA: Insufficient documentation

## 2018-12-03 DIAGNOSIS — M5412 Radiculopathy, cervical region: Secondary | ICD-10-CM | POA: Diagnosis present

## 2018-12-03 DIAGNOSIS — Z419 Encounter for procedure for purposes other than remedying health state, unspecified: Secondary | ICD-10-CM

## 2018-12-03 DIAGNOSIS — E119 Type 2 diabetes mellitus without complications: Secondary | ICD-10-CM | POA: Diagnosis not present

## 2018-12-03 DIAGNOSIS — Z79899 Other long term (current) drug therapy: Secondary | ICD-10-CM | POA: Diagnosis not present

## 2018-12-03 DIAGNOSIS — M5013 Cervical disc disorder with radiculopathy, cervicothoracic region: Secondary | ICD-10-CM | POA: Diagnosis present

## 2018-12-03 DIAGNOSIS — G4730 Sleep apnea, unspecified: Secondary | ICD-10-CM | POA: Diagnosis not present

## 2018-12-03 HISTORY — PX: ANTERIOR CERVICAL DECOMP/DISCECTOMY FUSION: SHX1161

## 2018-12-03 LAB — GLUCOSE, CAPILLARY
Glucose-Capillary: 121 mg/dL — ABNORMAL HIGH (ref 70–99)
Glucose-Capillary: 151 mg/dL — ABNORMAL HIGH (ref 70–99)
Glucose-Capillary: 265 mg/dL — ABNORMAL HIGH (ref 70–99)
Glucose-Capillary: 193 mg/dL — ABNORMAL HIGH (ref 70–99)

## 2018-12-03 SURGERY — ANTERIOR CERVICAL DECOMPRESSION/DISCECTOMY FUSION 1 LEVEL
Anesthesia: General

## 2018-12-03 MED ORDER — ROCURONIUM BROMIDE 10 MG/ML (PF) SYRINGE
PREFILLED_SYRINGE | INTRAVENOUS | Status: AC
  Filled 2018-12-03: qty 10

## 2018-12-03 MED ORDER — CHLORHEXIDINE GLUCONATE CLOTH 2 % EX PADS: 6.0000 | MEDICATED_PAD | Freq: Once | CUTANEOUS | Status: DC

## 2018-12-03 MED ORDER — POLYETHYLENE GLYCOL 3350 17 G PO PACK: 17.0000 g | PACK | Freq: Every day | ORAL | Status: DC | PRN

## 2018-12-03 MED ORDER — FENTANYL CITRATE (PF) 100 MCG/2ML IJ SOLN
INTRAMUSCULAR | Status: AC
  Filled 2018-12-03: qty 2

## 2018-12-03 MED ORDER — ONDANSETRON HCL 4 MG/2ML IJ SOLN: 4.0000 mg | Freq: Four times a day (QID) | INTRAMUSCULAR | Status: DC | PRN

## 2018-12-03 MED ORDER — CYCLOBENZAPRINE HCL 10 MG PO TABS
10.0000 mg | ORAL_TABLET | Freq: Three times a day (TID) | ORAL | Status: DC | PRN
  Administered 2018-12-03 – 2018-12-04 (×2): 10 mg via ORAL
  Filled 2018-12-03 (×2): qty 1

## 2018-12-03 MED ORDER — SODIUM CHLORIDE 0.9 % IV SOLN: 250.0000 mL | INTRAVENOUS | Status: DC

## 2018-12-03 MED ORDER — FENTANYL CITRATE (PF) 100 MCG/2ML IJ SOLN
25.0000 ug | INTRAMUSCULAR | Status: DC | PRN
  Administered 2018-12-03: 12:00:00 25 ug via INTRAVENOUS

## 2018-12-03 MED ORDER — SUGAMMADEX SODIUM 500 MG/5ML IV SOLN
INTRAVENOUS | Status: AC
  Filled 2018-12-03: qty 5

## 2018-12-03 MED ORDER — MENTHOL 3 MG MT LOZG: 1.0000 | LOZENGE | OROMUCOSAL | Status: DC | PRN

## 2018-12-03 MED ORDER — OXYCODONE HCL 5 MG/5ML PO SOLN: 5.0000 mg | Freq: Once | ORAL | Status: AC | PRN

## 2018-12-03 MED ORDER — PROPOFOL 10 MG/ML IV BOLUS
INTRAVENOUS | Status: AC
  Filled 2018-12-03: qty 20

## 2018-12-03 MED ORDER — ROCURONIUM BROMIDE 10 MG/ML (PF) SYRINGE
PREFILLED_SYRINGE | INTRAVENOUS | Status: DC | PRN
  Administered 2018-12-03: 50 mg via INTRAVENOUS
  Administered 2018-12-03: 20 mg via INTRAVENOUS
  Administered 2018-12-03 (×2): 10 mg via INTRAVENOUS

## 2018-12-03 MED ORDER — OXYCODONE HCL 5 MG PO TABS
ORAL_TABLET | ORAL | Status: AC
  Filled 2018-12-03: qty 1

## 2018-12-03 MED ORDER — FENTANYL CITRATE (PF) 250 MCG/5ML IJ SOLN
INTRAMUSCULAR | Status: AC
  Filled 2018-12-03: qty 5

## 2018-12-03 MED ORDER — ACETAMINOPHEN 650 MG RE SUPP: 650.0000 mg | RECTAL | Status: DC | PRN

## 2018-12-03 MED ORDER — FENTANYL CITRATE (PF) 100 MCG/2ML IJ SOLN
INTRAMUSCULAR | Status: DC | PRN
  Administered 2018-12-03 (×2): 50 ug via INTRAVENOUS
  Administered 2018-12-03: 150 ug via INTRAVENOUS
  Administered 2018-12-03 (×2): 50 ug via INTRAVENOUS

## 2018-12-03 MED ORDER — DEXAMETHASONE SODIUM PHOSPHATE 10 MG/ML IJ SOLN
INTRAMUSCULAR | Status: AC
  Filled 2018-12-03: qty 1

## 2018-12-03 MED ORDER — THROMBIN 5000 UNITS EX SOLR
OROMUCOSAL | Status: DC | PRN
  Administered 2018-12-03: 5 mL via TOPICAL

## 2018-12-03 MED ORDER — DEXAMETHASONE SODIUM PHOSPHATE 10 MG/ML IJ SOLN
INTRAMUSCULAR | Status: DC | PRN
  Administered 2018-12-03: 10 mg via INTRAVENOUS

## 2018-12-03 MED ORDER — ONDANSETRON HCL 4 MG/2ML IJ SOLN
INTRAMUSCULAR | Status: AC
  Filled 2018-12-03: qty 2

## 2018-12-03 MED ORDER — METFORMIN HCL 500 MG PO TABS
1000.0000 mg | ORAL_TABLET | Freq: Two times a day (BID) | ORAL | Status: DC
  Administered 2018-12-03: 1000 mg via ORAL
  Filled 2018-12-03: qty 2

## 2018-12-03 MED ORDER — LIDOCAINE-EPINEPHRINE 1 %-1:100000 IJ SOLN
INTRAMUSCULAR | Status: AC
  Filled 2018-12-03: qty 1

## 2018-12-03 MED ORDER — 0.9 % SODIUM CHLORIDE (POUR BTL) OPTIME
TOPICAL | Status: DC | PRN
  Administered 2018-12-03: 07:00:00 1000 mL

## 2018-12-03 MED ORDER — ONDANSETRON HCL 4 MG/2ML IJ SOLN
INTRAMUSCULAR | Status: DC | PRN
  Administered 2018-12-03: 4 mg via INTRAVENOUS

## 2018-12-03 MED ORDER — SUCCINYLCHOLINE CHLORIDE 200 MG/10ML IV SOSY
PREFILLED_SYRINGE | INTRAVENOUS | Status: AC
  Filled 2018-12-03: qty 10

## 2018-12-03 MED ORDER — IRBESARTAN 75 MG PO TABS
75.0000 mg | ORAL_TABLET | Freq: Every day | ORAL | Status: DC
  Administered 2018-12-03: 75 mg via ORAL
  Filled 2018-12-03 (×2): qty 1

## 2018-12-03 MED ORDER — PHENYLEPHRINE 40 MCG/ML (10ML) SYRINGE FOR IV PUSH (FOR BLOOD PRESSURE SUPPORT)
PREFILLED_SYRINGE | INTRAVENOUS | Status: DC | PRN
  Administered 2018-12-03: 120 ug via INTRAVENOUS

## 2018-12-03 MED ORDER — MIDAZOLAM HCL 2 MG/2ML IJ SOLN
INTRAMUSCULAR | Status: AC
  Filled 2018-12-03: qty 2

## 2018-12-03 MED ORDER — LIDOCAINE 2% (20 MG/ML) 5 ML SYRINGE
INTRAMUSCULAR | Status: DC | PRN
  Administered 2018-12-03: 100 mg via INTRAVENOUS

## 2018-12-03 MED ORDER — DOCUSATE SODIUM 100 MG PO CAPS
100.0000 mg | ORAL_CAPSULE | Freq: Two times a day (BID) | ORAL | Status: DC
  Administered 2018-12-03: 100 mg via ORAL
  Filled 2018-12-03: qty 1

## 2018-12-03 MED ORDER — ONDANSETRON HCL 4 MG PO TABS: 4.0000 mg | ORAL_TABLET | Freq: Four times a day (QID) | ORAL | Status: DC | PRN

## 2018-12-03 MED ORDER — PROPOFOL 10 MG/ML IV BOLUS
INTRAVENOUS | Status: DC | PRN
  Administered 2018-12-03: 200 mg via INTRAVENOUS

## 2018-12-03 MED ORDER — SODIUM CHLORIDE 0.9% FLUSH: 3.0000 mL | INTRAVENOUS | Status: DC | PRN

## 2018-12-03 MED ORDER — ATORVASTATIN CALCIUM 10 MG PO TABS: 20.0000 mg | ORAL_TABLET | Freq: Every day | ORAL | Status: DC

## 2018-12-03 MED ORDER — SUGAMMADEX SODIUM 200 MG/2ML IV SOLN
INTRAVENOUS | Status: DC | PRN
  Administered 2018-12-03: 300 mg via INTRAVENOUS

## 2018-12-03 MED ORDER — SODIUM CHLORIDE 0.9 % IV SOLN
INTRAVENOUS | Status: DC | PRN
  Administered 2018-12-03: 500 mL

## 2018-12-03 MED ORDER — MIDAZOLAM HCL 5 MG/5ML IJ SOLN
INTRAMUSCULAR | Status: DC | PRN
  Administered 2018-12-03: 2 mg via INTRAVENOUS

## 2018-12-03 MED ORDER — PHENYLEPHRINE 40 MCG/ML (10ML) SYRINGE FOR IV PUSH (FOR BLOOD PRESSURE SUPPORT)
PREFILLED_SYRINGE | INTRAVENOUS | Status: AC
  Filled 2018-12-03: qty 10

## 2018-12-03 MED ORDER — ACETAMINOPHEN 325 MG PO TABS
650.0000 mg | ORAL_TABLET | ORAL | Status: DC | PRN
  Administered 2018-12-03 – 2018-12-04 (×2): 650 mg via ORAL
  Filled 2018-12-03 (×2): qty 2

## 2018-12-03 MED ORDER — EPHEDRINE 5 MG/ML INJ
INTRAVENOUS | Status: AC
  Filled 2018-12-03: qty 10

## 2018-12-03 MED ORDER — OXYCODONE HCL 5 MG PO TABS
10.0000 mg | ORAL_TABLET | ORAL | Status: DC | PRN
  Administered 2018-12-03 (×2): 10 mg via ORAL
  Filled 2018-12-03 (×3): qty 2

## 2018-12-03 MED ORDER — PHENYLEPHRINE HCL-NACL 10-0.9 MG/250ML-% IV SOLN
INTRAVENOUS | Status: DC | PRN
  Administered 2018-12-03: 50 ug/min via INTRAVENOUS

## 2018-12-03 MED ORDER — HYDROMORPHONE HCL 1 MG/ML IJ SOLN
1.0000 mg | INTRAMUSCULAR | Status: DC | PRN
  Administered 2018-12-03: 1 mg via INTRAVENOUS
  Filled 2018-12-03: qty 1

## 2018-12-03 MED ORDER — PHENOL 1.4 % MT LIQD
1.0000 | OROMUCOSAL | Status: DC | PRN
  Administered 2018-12-03: 1 via OROMUCOSAL
  Filled 2018-12-03: qty 177

## 2018-12-03 MED ORDER — ZONISAMIDE 25 MG PO CAPS
50.0000 mg | ORAL_CAPSULE | Freq: Two times a day (BID) | ORAL | Status: DC
  Administered 2018-12-03: 50 mg via ORAL
  Filled 2018-12-03 (×3): qty 2

## 2018-12-03 MED ORDER — LIDOCAINE 2% (20 MG/ML) 5 ML SYRINGE
INTRAMUSCULAR | Status: AC
  Filled 2018-12-03: qty 5

## 2018-12-03 MED ORDER — OXYCODONE HCL 5 MG PO TABS
5.0000 mg | ORAL_TABLET | ORAL | Status: DC | PRN
  Administered 2018-12-04: 5 mg via ORAL
  Filled 2018-12-03: qty 1

## 2018-12-03 MED ORDER — SUCCINYLCHOLINE CHLORIDE 200 MG/10ML IV SOSY
PREFILLED_SYRINGE | INTRAVENOUS | Status: DC | PRN
  Administered 2018-12-03: 120 mg via INTRAVENOUS

## 2018-12-03 MED ORDER — SODIUM CHLORIDE 0.9% FLUSH
3.0000 mL | Freq: Two times a day (BID) | INTRAVENOUS | Status: DC
  Administered 2018-12-03 (×2): 3 mL via INTRAVENOUS

## 2018-12-03 MED ORDER — THROMBIN 5000 UNITS EX SOLR
CUTANEOUS | Status: AC
  Filled 2018-12-03: qty 5000

## 2018-12-03 MED ORDER — TORSEMIDE 20 MG PO TABS
30.0000 mg | ORAL_TABLET | Freq: Every day | ORAL | Status: DC
  Administered 2018-12-03: 30 mg via ORAL
  Filled 2018-12-03 (×2): qty 1

## 2018-12-03 MED ORDER — LACTATED RINGERS IV SOLN
INTRAVENOUS | Status: DC
  Administered 2018-12-03 (×2): via INTRAVENOUS

## 2018-12-03 MED ORDER — LIDOCAINE-EPINEPHRINE 1 %-1:100000 IJ SOLN
INTRAMUSCULAR | Status: DC | PRN
  Administered 2018-12-03: 7 mL

## 2018-12-03 MED ORDER — OXYCODONE HCL 5 MG PO TABS
5.0000 mg | ORAL_TABLET | Freq: Once | ORAL | Status: AC | PRN
  Administered 2018-12-03: 5 mg via ORAL

## 2018-12-03 SURGICAL SUPPLY — 57 items
ADH SKN CLS APL DERMABOND .7 (GAUZE/BANDAGES/DRESSINGS) ×1
APL SKNCLS STERI-STRIP NONHPOA (GAUZE/BANDAGES/DRESSINGS)
BAG DECANTER FOR FLEXI CONT (MISCELLANEOUS) ×2 IMPLANT
BENZOIN TINCTURE PRP APPL 2/3 (GAUZE/BANDAGES/DRESSINGS) IMPLANT
BLADE CLIPPER SURG (BLADE) IMPLANT
BLADE SURG 11 STRL SS (BLADE) ×2 IMPLANT
BUR MATCHSTICK NEURO 3.0 LAGG (BURR) ×2 IMPLANT
CANISTER SUCT 3000ML PPV (MISCELLANEOUS) ×2 IMPLANT
COVER WAND RF STERILE (DRAPES) ×2 IMPLANT
DECANTER SPIKE VIAL GLASS SM (MISCELLANEOUS) ×2 IMPLANT
DERMABOND ADVANCED (GAUZE/BANDAGES/DRESSINGS) ×1
DERMABOND ADVANCED .7 DNX12 (GAUZE/BANDAGES/DRESSINGS) ×1 IMPLANT
DRAPE C-ARM 42X72 X-RAY (DRAPES) ×4 IMPLANT
DRAPE HALF SHEET 40X57 (DRAPES) IMPLANT
DRAPE LAPAROTOMY 100X72 PEDS (DRAPES) ×2 IMPLANT
DRAPE MICROSCOPE LEICA (MISCELLANEOUS) ×2 IMPLANT
DURAPREP 6ML APPLICATOR 50/CS (WOUND CARE) ×2 IMPLANT
ELECT COATED BLADE 2.86 ST (ELECTRODE) ×2 IMPLANT
ELECT REM PT RETURN 9FT ADLT (ELECTROSURGICAL) ×2
ELECTRODE REM PT RTRN 9FT ADLT (ELECTROSURGICAL) ×1 IMPLANT
GAUZE 4X4 16PLY RFD (DISPOSABLE) IMPLANT
GLOVE BIO SURGEON STRL SZ7.5 (GLOVE) ×2 IMPLANT
GLOVE BIOGEL PI IND STRL 7.5 (GLOVE) ×2 IMPLANT
GLOVE BIOGEL PI IND STRL 8 (GLOVE) IMPLANT
GLOVE BIOGEL PI INDICATOR 7.5 (GLOVE) ×2
GLOVE BIOGEL PI INDICATOR 8 (GLOVE) ×1
GLOVE EXAM NITRILE LRG STRL (GLOVE) IMPLANT
GLOVE EXAM NITRILE XL STR (GLOVE) IMPLANT
GLOVE EXAM NITRILE XS STR PU (GLOVE) IMPLANT
GOWN STRL REUS W/ TWL LRG LVL3 (GOWN DISPOSABLE) ×2 IMPLANT
GOWN STRL REUS W/ TWL XL LVL3 (GOWN DISPOSABLE) IMPLANT
GOWN STRL REUS W/TWL 2XL LVL3 (GOWN DISPOSABLE) IMPLANT
GOWN STRL REUS W/TWL LRG LVL3 (GOWN DISPOSABLE) ×8
GOWN STRL REUS W/TWL XL LVL3 (GOWN DISPOSABLE)
HEMOSTAT POWDER KIT SURGIFOAM (HEMOSTASIS) ×2 IMPLANT
KIT BASIN OR (CUSTOM PROCEDURE TRAY) ×2 IMPLANT
KIT TURNOVER KIT B (KITS) ×2 IMPLANT
NDL SPNL 18GX3.5 QUINCKE PK (NEEDLE) ×1 IMPLANT
NEEDLE HYPO 22GX1.5 SAFETY (NEEDLE) ×2 IMPLANT
NEEDLE SPNL 18GX3.5 QUINCKE PK (NEEDLE) ×2 IMPLANT
NS IRRIG 1000ML POUR BTL (IV SOLUTION) ×2 IMPLANT
PACK LAMINECTOMY NEURO (CUSTOM PROCEDURE TRAY) ×2 IMPLANT
PAD ARMBOARD 7.5X6 YLW CONV (MISCELLANEOUS) ×3 IMPLANT
PIN DISTRACTION 14MM (PIN) ×2 IMPLANT
PLATE ELITE 21MM (Plate) ×1 IMPLANT
RUBBERBAND STERILE (MISCELLANEOUS) ×4 IMPLANT
SCREW SELF TAP VAR 4.0X13 (Screw) ×4 IMPLANT
SPACER BONE CORNERSTONE 7X14 (Orthopedic Implant) ×1 IMPLANT
SPONGE INTESTINAL PEANUT (DISPOSABLE) ×2 IMPLANT
SPONGE SURGIFOAM ABS GEL SZ50 (HEMOSTASIS) IMPLANT
STAPLER VISISTAT 35W (STAPLE) IMPLANT
SUT MNCRL AB 3-0 PS2 18 (SUTURE) ×2 IMPLANT
SUT VIC AB 3-0 SH 8-18 (SUTURE) ×2 IMPLANT
TAPE CLOTH 3X10 TAN LF (GAUZE/BANDAGES/DRESSINGS) ×2 IMPLANT
TOWEL GREEN STERILE (TOWEL DISPOSABLE) ×2 IMPLANT
TOWEL GREEN STERILE FF (TOWEL DISPOSABLE) ×2 IMPLANT
WATER STERILE IRR 1000ML POUR (IV SOLUTION) ×2 IMPLANT

## 2018-12-03 NOTE — Transfer of Care (Signed)
Immediate Anesthesia Transfer of Care Note  Patient: Angel Watkins  Procedure(s) Performed: Cervical five-six  Anterior cervical decompression/discectomy/fusion (N/A )  Patient Location: PACU  Anesthesia Type:General  Level of Consciousness: awake, alert  and oriented  Airway & Oxygen Therapy: Patient Spontanous Breathing  Post-op Assessment: Report given to RN and Post -op Vital signs reviewed and stable  Post vital signs: Reviewed and stable  Last Vitals:  Vitals Value Taken Time  BP 148/90 12/03/18 1113  Temp    Pulse 72 12/03/18 1117  Resp 16 12/03/18 1117  SpO2 100 % 12/03/18 1117  Vitals shown include unvalidated device data.  Last Pain:  Vitals:   12/03/18 0715  TempSrc:   PainSc: 7       Patients Stated Pain Goal: 4 (62/56/38 9373)  Complications: No apparent anesthesia complications

## 2018-12-03 NOTE — Brief Op Note (Signed)
12/03/2018  11:04 AM  PATIENT:  Angel Watkins  58 y.o. female  PRE-OPERATIVE DIAGNOSIS:  Cervical radiculopathy  POST-OPERATIVE DIAGNOSIS:  Cervical radiculopathy  PROCEDURE:  Procedure(s): Cervical five-six  Anterior cervical decompression/discectomy/fusion (N/A)  SURGEON:  Surgeon(s) and Role:    * Judith Part, MD - Primary  PHYSICIAN ASSISTANT:   ASSISTANTS: none   ANESTHESIA:   general  EBL:  100 mL   BLOOD ADMINISTERED:none  DRAINS: none   LOCAL MEDICATIONS USED:  LIDOCAINE   SPECIMEN:  No Specimen  DISPOSITION OF SPECIMEN:  N/A  COUNTS:  YES  TOURNIQUET:  * No tourniquets in log *  DICTATION: .Note written in EPIC  PLAN OF CARE: Admit for overnight observation  PATIENT DISPOSITION:  PACU - hemodynamically stable.   Delay start of Pharmacological VTE agent (>24hrs) due to surgical blood loss or risk of bleeding: yes

## 2018-12-03 NOTE — Evaluation (Addendum)
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  PMH positive for arthritis, sleep apnea, DM, fibromyalgia and previous back surgery.  Clinical Impression  Patient presents with decreased mobility due to decreased balance and decreased activity tolerance.  Currently min to minguard assist overall.  Feel she will benefit from skilled PT in the acute setting with one more session prior to d/c for stair training and to determine if assistive device needed.  Likely no follow up PT needs at least initially upon d/c.     Follow Up Recommendations No PT follow up    Equipment Recommendations  None recommended by PT    Recommendations for Other Services       Precautions / Restrictions Precautions Precautions: Cervical;Fall Precaution Booklet Issued: Yes (comment) Precaution Comments: issued and reviewed handout for precautions      Mobility  Bed Mobility Overal bed mobility: Needs Assistance Bed Mobility: Supine to Sit;Sidelying to Sit     Supine to sit: HOB elevated;Supervision Sit to supine: Mod assist   General bed mobility comments: up with HOB elevated due to pain in neck, to side with flat bed and assist to support head and lift one leg onto bed  Transfers Overall transfer level: Needs assistance Equipment used: None Transfers: Sit to/from Stand Sit to Stand: Min guard         General transfer comment: for balance  Ambulation/Gait Ambulation/Gait assistance: Min guard Gait Distance (Feet): 125 Feet Assistive device: None Gait Pattern/deviations: Step-through pattern;Step-to pattern;Decreased stride length;Wide base of support     General Gait Details: increased lateral sway due to body habitus, needing minguard for balance and touching railing at  times  Stairs            Wheelchair Mobility    Modified Rankin (Stroke Patients Only)       Balance Overall balance assessment: Needs assistance   Sitting balance-Leahy Scale: Good       Standing balance-Leahy Scale: Fair Standing balance comment: some imbalance noted with ambulation, discussed possibly using cane                             Pertinent Vitals/Pain Pain Assessment: 0-10 Pain Score: 9  Pain Location: neck in surgial area Pain Descriptors / Indicators: Guarding;Grimacing Pain Intervention(s): Monitored during session;Repositioned;Patient requesting pain meds-RN notified    Home Living Family/patient expects to be discharged to:: Private residence Living Arrangements: Spouse/significant other Available Help at Discharge: Family Type of Home: House Home Access: Stairs to enter Entrance Stairs-Rails: None Technical brewer of Steps: 2 Home Layout: One level Home Equipment: Radio producer - single point Additional Comments: working as Web designer    Prior Function Level of Independence: Architect Dominance   Dominant Hand: Right    Extremity/Trunk Assessment   Upper Extremity Assessment Upper Extremity Assessment: LUE deficits/detail;RUE deficits/detail RUE Deficits / Details: AROM shoulder flexion to 90 degrees with strength at least 3/5 (not formally tested due to cervical surgery), elbow flexion/extension strength 4-/5 LUE Deficits / Details: AROM shoulder flexion to 90 degrees with strength at least 3-/5 (not formally tested due to cervical surgery), elbow flexion/extension strength 4-/5    Lower Extremity Assessment Lower Extremity Assessment: LLE  deficits/detail;RLE deficits/detail RLE Deficits / Details: AROM WFL, strength grossly 4/5 LLE Deficits / Details: AROM WFL, strength grossly 4-/5 LLE Sensation: decreased light touch    Cervical / Trunk Assessment Cervical / Trunk Assessment:  Other exceptions Cervical / Trunk Exceptions: s/p cervical spine surgery; noted scar on lower back from prior back surgery  Communication   Communication: No difficulties  Cognition Arousal/Alertness: Awake/alert Behavior During Therapy: WFL for tasks assessed/performed Overall Cognitive Status: Within Functional Limits for tasks assessed                                        General Comments      Exercises     Assessment/Plan    PT Assessment Patient needs continued PT services  PT Problem List Decreased balance;Decreased knowledge of use of DME;Decreased knowledge of precautions;Decreased mobility;Decreased activity tolerance       PT Treatment Interventions Stair training;DME instruction;Therapeutic activities;Balance training;Functional mobility training;Gait training;Patient/family education    PT Goals (Current goals can be found in the Care Plan section)  Acute Rehab PT Goals Patient Stated Goal: return to independent PT Goal Formulation: With patient Time For Goal Achievement: 12/10/18 Potential to Achieve Goals: Good    Frequency Min 5X/week   Barriers to discharge        Co-evaluation               AM-PAC PT "6 Clicks" Mobility  Outcome Measure Help needed turning from your back to your side while in a flat bed without using bedrails?: A Little Help needed moving from lying on your back to sitting on the side of a flat bed without using bedrails?: A Lot Help needed moving to and from a bed to a chair (including a wheelchair)?: A Little Help needed standing up from a chair using your arms (e.g., wheelchair or bedside chair)?: A Little Help needed to walk in hospital room?: A Little Help needed climbing 3-5 steps with a railing? : A Little 6 Click Score: 17    End of Session   Activity Tolerance: Patient limited by pain Patient left: in bed;with call bell/phone within reach Nurse Communication: Patient requests pain meds PT Visit  Diagnosis: Other abnormalities of gait and mobility (R26.89);Difficulty in walking, not elsewhere classified (R26.2)    Time: 5638-7564 PT Time Calculation (min) (ACUTE ONLY): 28 min   Charges:   PT Evaluation $PT Eval Low Complexity: 1 Low PT Treatments $Gait Training: 8-22 mins        Sheran Lawless, [facility] [phone] 12/03/2018   Elray Mcgregor 12/03/2018, 5:17 PM

## 2018-12-03 NOTE — Anesthesia Procedure Notes (Signed)
Procedure Name: Intubation Date/Time: 12/03/2018 8:12 AM Performed by: Marsa Aris, CRNA Pre-anesthesia Checklist: Patient identified, Emergency Drugs available, Suction available and Patient being monitored Patient Re-evaluated:Patient Re-evaluated prior to induction Oxygen Delivery Method: Circle System Utilized Preoxygenation: Pre-oxygenation with 100% oxygen Induction Type: IV induction Ventilation: Mask ventilation without difficulty Laryngoscope Size: Glidescope and 3 Grade View: Grade I Tube type: Oral Tube size: 7.0 mm Number of attempts: 1 Airway Equipment and Method: Stylet and Bite block (soft bite block) Placement Confirmation: ETT inserted through vocal cords under direct vision,  positive ETCO2 and breath sounds checked- equal and bilateral Secured at: 22 cm Tube secured with: Tape Dental Injury: Teeth and Oropharynx as per pre-operative assessment  Difficulty Due To: Difficult Airway- due to large tongue, Difficult Airway- due to reduced neck mobility and Difficult Airway- due to limited oral opening Comments: Recommend glidescope intubation, c-spine neutrality maintained, no change in dentition from pre-procedure

## 2018-12-03 NOTE — Anesthesia Preprocedure Evaluation (Signed)
Anesthesia Evaluation  Patient identified by MRN, date of birth, ID band Patient awake    Reviewed: Allergy & Precautions, H&P , NPO status , Patient's Chart, lab work & pertinent test results  Airway Mallampati: II   Neck ROM: full    Dental   Pulmonary sleep apnea ,    breath sounds clear to auscultation       Cardiovascular hypertension,  Rhythm:regular Rate:Normal     Neuro/Psych Anxiety  Neuromuscular disease    GI/Hepatic   Endo/Other  diabetes, Type 2Morbid obesity  Renal/GU      Musculoskeletal  (+) Arthritis , Fibromyalgia -  Abdominal   Peds  Hematology   Anesthesia Other Findings   Reproductive/Obstetrics                             Anesthesia Physical Anesthesia Plan  ASA: III  Anesthesia Plan: General   Post-op Pain Management:    Induction: Intravenous  PONV Risk Score and Plan: 3 and Ondansetron, Dexamethasone, Midazolam and Treatment may vary due to age or medical condition  Airway Management Planned: Oral ETT and Video Laryngoscope Planned  Additional Equipment:   Intra-op Plan:   Post-operative Plan: Extubation in OR  Informed Consent: I have reviewed the patients History and Physical, chart, labs and discussed the procedure including the risks, benefits and alternatives for the proposed anesthesia with the patient or authorized representative who has indicated his/her understanding and acceptance.       Plan Discussed with: CRNA, Anesthesiologist and Surgeon  Anesthesia Plan Comments:         Anesthesia Quick Evaluation

## 2018-12-03 NOTE — Op Note (Signed)
PATIENT: Angel Watkins  PROCEDURE DATE: 12/03/18  PRE-OPERATIVE DIAGNOSIS:  Cervical radiculopathy   POST-OPERATIVE DIAGNOSIS:  Cervical radiculopathy   PROCEDURE:  C5-C6 Anterior Cervical Discectomy and Instrumented Fusion   SURGEON:  Surgeon(s) and Role:    Judith Part, MD - Primary   ANESTHESIA: ETGA   BRIEF HISTORY: This is a 58 year old woman who presented with a left C6 radiculopathy. Her symptoms were unfortunately medically refractory and MRI showed corresponding foraminal stenosis due to a large disc herniation. I therefore recommended a C5-6 ACDF. This was discussed with the patient as well as risks, benefits, and alternatives and the patient wished to proceed with surgical treatment.   OPERATIVE DETAIL: The patient was taken to the operating room and placed on the OR table in the supine position. A formal time out was performed with two patient identifiers and confirmed the operative site. Anesthesia was induced by the anesthesia team.  Fluoroscopy was used to localize the surgical level and an incision was marked in a skin crease. The area was then prepped and draped in a sterile fashion. A transverse linear incision was made on the right side of the neck. As expected, landmarks were more difficult to appreciate due to increased adiposity and took an increased amount of time to safely identify and dissect. The platysma was divided and the sternocleidomastoid muscle was identified. The carotid sheath was palpated, identified, and retracted laterally with the sternocleidomastoid muscle. The strap muscles were identified and retracted medially and the pretracheal fascia was entered. A bent spinal needle was used with fluoroscopy to localize the surgical level after dissection. Given the patient's habitus, markers had to be placed at C3-4, C4-5, and C5-6 to count down to the correct level. The longus colli were elevated bilaterally and a self-retaining retractor was placed.  The endotracheal tube cuff balloon was deflated and reinflated after retractor placement.   Anterior osteophytes were removed until flush with the anterior vertebral body. The disc annulus was incised and a complete C5-C6 discectomy was performed. The posterior longitudinal ligament was incised followed by ligamentous and bony removal until no central canal stenosis was present. Decompression was then taken out laterally into the bilateral foramina until no foraminal stenosis was palpable. A 102mm cortical allograft (Medtronic) was inserted into the disc space as an interbody graft. An anterior plate ([employer]tronic) was positioned and 4, 30mm screws were used to secure the plate to the C5 and C6 vertebral bodies. Hemostasis was obtained and the incision was closed in layers. All instrument and sponge counts were correct. The patient was then returned to anesthesia for emergence. No apparent complications at the completion of the procedure.   EBL:  54mL   DRAINS: none   SPECIMENS: none   Judith Part, MD 12/03/18 11:04 AM

## 2018-12-03 NOTE — Progress Notes (Signed)
NCM received consult:  Home health needs     Equipment    Medication needs    Other (see comments)    PT/OT evaluations pending, NCM to f/u with needs... Whitman Hero RN,BSN,CM [phone]

## 2018-12-03 NOTE — H&P (Signed)
Surgical H&P Update  HPI: 58 y.o. woman with a history of LUE C6 radiculopathy, here for ACDF. She has had radiating pain, numbness, and paresthesias in the LUE that have unfortunately become medically refractive. MRI showed corresponding HNP with severe foraminal stenosis at C5-6. No changes in health since she was last seen. Still having symptoms and wishes to proceed with surgery.  PMHx:  Past Medical History:  Diagnosis Date  . Anxiety   . Arthritis   . Diabetes mellitus without complication ([place])   . Fibromyalgia   . Hypertension   . Sleep apnea    FamHx: History reviewed. No pertinent family history. SocHx:  reports that she has never smoked. She has never used smokeless tobacco. She reports that she does not drink alcohol or use drugs.  Physical Exam: AOx3, PERRL, FS, TM  Strength 5/5 x4, SILTx4 except L C6 distribution numbness  Assesment/Plan: 58 y.o. woman with L C6 radiculopathy, here for C5-6 ACDF. Risks, benefits, and alternatives discussed and the patient would like to continue with surgery.  -OR today -3C post-op  Judith Part, MD 12/03/18 7:51 AM

## 2018-12-04 DIAGNOSIS — M5013 Cervical disc disorder with radiculopathy, cervicothoracic region: Secondary | ICD-10-CM | POA: Diagnosis not present

## 2018-12-04 LAB — GLUCOSE, CAPILLARY
Glucose-Capillary: 168 mg/dL — ABNORMAL HIGH (ref 70–99)
Glucose-Capillary: 134 mg/dL — ABNORMAL HIGH (ref 70–99)

## 2018-12-04 MED ORDER — CYCLOBENZAPRINE HCL 10 MG PO TABS: 10.0000 mg | ORAL_TABLET | Freq: Three times a day (TID) | ORAL | 0 refills | Status: DC | PRN

## 2018-12-04 MED ORDER — DICLOFENAC SODIUM ER 100 MG PO TB24: 100.0000 mg | ORAL_TABLET | Freq: Every day | ORAL | Status: DC

## 2018-12-04 MED ORDER — OXYCODONE HCL 5 MG PO TABS: 5.0000 mg | ORAL_TABLET | ORAL | 0 refills | Status: DC | PRN

## 2018-12-04 NOTE — Progress Notes (Signed)
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  PMH positive for arthritis, sleep apnea, DM, fibromyalgia and previous back surgery.    PT Comments    Pt progressing well with post-op mobility. She was able to demonstrate transfers and ambulation with gross min guard assist to supervision for safety without an AD. Reinforced cervical precautions, positioning recommendations, appropriate activity progression, and car transfer. Will continue to follow.    Follow Up Recommendations  No PT follow up     Equipment Recommendations  None recommended by PT    Recommendations for Other Services       Precautions / Restrictions Precautions Precautions: Cervical;Fall Precaution Booklet Issued: Yes (comment) Precaution Comments: issued and reviewed handout for precautions Restrictions Weight Bearing Restrictions: No    Mobility  Bed Mobility Overal bed mobility: Needs Assistance Bed Mobility: Supine to Sit;Sit to Supine     Supine to sit: HOB elevated;Supervision Sit to supine: HOB elevated;Supervision   General bed mobility comments: Pt reporting she does not sleep in a bed at home and will be sleeping in her recliner. Pt with HOB elevated significantly and scooted in and out of the bed without breaking precautions. Reviewed log roll but did not make her practice it as she will not be utilizing it at home.   Transfers Overall transfer level: Needs assistance Equipment used: None Transfers: Sit to/from Stand Sit to Stand: Supervision         General transfer comment: Close supervision for safety. Pt was able to demonstrate good posture with transition to/from sitting.   Ambulation/Gait Ambulation/Gait assistance: Min guard;Supervision Gait Distance (Feet): 300  Feet Assistive device: None Gait Pattern/deviations: Step-through pattern;Step-to pattern;Decreased stride length;Wide base of support Gait velocity: Decreased Gait velocity interpretation: <1.31 ft/sec, indicative of household ambulator General Gait Details: Occasional railing use in hall but overall steady without AD. Pt with 1 standing rest break due to low back pain. Audible knees popping with ambulation.    Stairs         General stair comments: Pt reports she feels comfortable negotiating stairs at home and declined practice here due to low back pain and bilateral knee pain.    Wheelchair Mobility    Modified Rankin (Stroke Patients Only)       Balance Overall balance assessment: Needs assistance Sitting-balance support: No upper extremity supported;Feet supported Sitting balance-Leahy Scale: Good Sitting balance - Comments: able to dress while sitting EOB   Standing balance support: No upper extremity supported;During functional activity Standing balance-Leahy Scale: Fair Standing balance comment: some imbalance noted with ambulation, discussed possibly using cane                            Cognition Arousal/Alertness: Awake/alert Behavior During Therapy: WFL for tasks assessed/performed Overall Cognitive Status: Within Functional Limits for tasks assessed                                 General Comments: demonstrated good adherence to cervical precautions      Exercises      General Comments General comments (skin integrity, edema, etc.): vss throughout      Pertinent Vitals/Pain Pain Assessment: Faces Pain Score: 5  Faces Pain Scale: Hurts  even more Pain Location: neck in surgial area, low back, bilateral knees Pain Descriptors / Indicators: Grimacing;Operative site guarding;Stabbing Pain Intervention(s): Limited activity within patient's tolerance;Monitored during session;Repositioned    Home Living Family/patient expects  to be discharged to:: Private residence Living Arrangements: Spouse/significant other Available Help at Discharge: Family Type of Home: House Home Access: Stairs to enter Entrance Stairs-Rails: None Home Layout: One level Home Equipment: Radio producer - single point Additional Comments: working as Web designer    Prior Function Level of Independence: Independent          PT Goals (current goals can now be found in the care plan section) Acute Rehab PT Goals Patient Stated Goal: return to independent PT Goal Formulation: With patient Time For Goal Achievement: 12/10/18 Potential to Achieve Goals: Good Progress towards PT goals: Progressing toward goals    Frequency    Min 5X/week      PT Plan Current plan remains appropriate    Co-evaluation              AM-PAC PT "6 Clicks" Mobility   Outcome Measure  Help needed turning from your back to your side while in a flat bed without using bedrails?: A Little Help needed moving from lying on your back to sitting on the side of a flat bed without using bedrails?: A Lot Help needed moving to and from a bed to a chair (including a wheelchair)?: A Little Help needed standing up from a chair using your arms (e.g., wheelchair or bedside chair)?: A Little Help needed to walk in hospital room?: A Little Help needed climbing 3-5 steps with a railing? : A Little 6 Click Score: 17    End of Session Equipment Utilized During Treatment: Gait belt Activity Tolerance: Patient limited by pain Patient left: in bed;with call bell/phone within reach Nurse Communication: Patient requests pain meds PT Visit Diagnosis: Other abnormalities of gait and mobility (R26.89);Difficulty in walking, not elsewhere classified (R26.2)     Time: 1610-9604 PT Time Calculation (min) (ACUTE ONLY): 14 min  Charges:  $Gait Training: 8-22 mins                     Rolinda Roan, PT, DPT Acute Rehabilitation Services Pager: [phone] Office:  [phone]    Thelma Comp 12/04/2018, 9:59 AM

## 2018-12-04 NOTE — Anesthesia Postprocedure Evaluation (Signed)
Anesthesia Post Note  Patient: Angel Watkins  Procedure(s) Performed: Cervical five-six  Anterior cervical decompression/discectomy/fusion (N/A )     Patient location during evaluation: PACU Anesthesia Type: General Level of consciousness: awake and alert Pain management: pain level controlled Vital Signs Assessment: post-procedure vital signs reviewed and stable Respiratory status: spontaneous breathing, nonlabored ventilation, respiratory function stable and patient connected to nasal cannula oxygen Cardiovascular status: blood pressure returned to baseline and stable Postop Assessment: no apparent nausea or vomiting Anesthetic complications: no    Last Vitals:  Vitals:   12/04/18 0334 12/04/18 0726  BP: 101/68 105/64  Pulse: 91 79  Resp: 20 18  Temp: 37.2 C 36.6 C  SpO2: 97% 98%    Last Pain:  Vitals:   12/04/18 0726  TempSrc: Oral  PainSc:                  [place] S

## 2018-12-04 NOTE — Discharge Summary (Signed)
Discharge Summary  Date of Admission: 12/03/2018  Date of Discharge: 12/04/18  Attending Physician: Emelda Brothers, MD  Hospital Course: Patient was admitted for observation overnight following an uncomplicated N9-8 ACDF. She was recovered in PACU and transferred to [facility]. Her hospital course was uncomplicated, her preoperative radicular symptoms were resolved immediately post-op, and the patient was discharged home on 12/04/18. She will follow up in clinic with me in 2 weeks.  Neurologic exam at discharge:  AOx3, PERRL, EOMI, FS, TM Strength 5/5 x4, SILTx4, no drift  Discharge diagnosis: Cervical radiculopathy  Judith Part, MD 12/04/18 7:06 AM

## 2018-12-04 NOTE — Plan of Care (Signed)
Patient alert and oriented, mae's well, voiding adequate amount of urine, swallowing without difficulty, no c/o pain at time of discharge. Patient discharged home with family. Script and discharged instructions given to patient. Patient and family stated understanding of instructions given. Patient has an appointment with Dr. Ostergard   

## 2018-12-04 NOTE — Progress Notes (Signed)
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  PMH positive for arthritis, sleep apnea, DM, fibromyalgia and previous back surgery.   Clinical Impression   PTA pt was independent with ADL/IADL and functional mobility. 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 Pt lives with her husband who is able to assist her as needed, he works 4 hours/day. Pt currently requires minA for LB dressing and supervision for functional mobility. Pt reports her husband will be able to assist as needed. Educated pt on cervical precautions and environmental modifications to adhere to back precautions. Patient evaluated by Occupational Therapy with no further acute OT needs identified. All education has been completed and the patient has no further questions. See below for any follow-up Occupational Therapy or equipment needs. OT to sign off. Thank you for referral.      Follow Up Recommendations  No OT follow up    Equipment Recommendations  None recommended by OT    Recommendations for Other Services       Precautions / Restrictions Precautions Precautions: Cervical;Fall Precaution Booklet Issued: Yes (comment) Precaution Comments: issued and reviewed handout for precautions Restrictions Weight Bearing Restrictions: No      Mobility Bed Mobility               General bed mobility comments: pt sitting on EOB upon arrival  Transfers Overall transfer level: Needs assistance Equipment used: None Transfers: Sit to/from Stand Sit to Stand: Supervision         General transfer comment: for safety and vc to not push through BUE    Balance Overall balance assessment: Needs assistance Sitting-balance support: No upper extremity supported;Feet supported Sitting  balance-Leahy Scale: Good Sitting balance - Comments: able to dress while sitting EOB     Standing balance-Leahy Scale: Fair Standing balance comment: some imbalance noted with ambulation, discussed possibly using cane                           ADL either performed or assessed with clinical judgement   ADL Overall ADL's : Needs assistance/impaired                                     Functional mobility during ADLs: Supervision/safety General ADL Comments: requires minA to don pants/socks over R foot;pt reports her husband will assist her with this;able to bring LLE onto bed in modified figure 4 position to don pants and socks on LLE while adhering to back precautions;minA to don shirt over her head;reviewed compensatory strategies and pt reports husband is able to assist her with dressing;reviewed environmental modifications to set items at countertop height for adherence to precautions     Vision Baseline Vision/History: Wears glasses Wears Glasses: At all times Patient Visual Report: No change from baseline       Perception     Praxis      Pertinent Vitals/Pain Pain Assessment: 0-10 Pain Score: 5  Pain Location: neck in surgial area Pain Descriptors / Indicators: Guarding;Grimacing Pain Intervention(s): Limited activity within patient's tolerance;Monitored during session     Hand Dominance Right   Extremity/Trunk Assessment Upper Extremity Assessment Upper Extremity Assessment: RUE deficits/detail;LUE deficits/detail RUE Deficits / Details:  AROM shoulder flexion to 90 degrees with strength at least 3/5 (not formally tested due to cervical surgery), elbow flexion/extension strength 4-/5; RUE Sensation: WNL RUE Coordination: WNL LUE Deficits / Details: AROM shoulder flexion to 90 degrees with strength at least 3-/5 (not formally tested due to cervical surgery), elbow flexion/extension strength 4-/5 LUE Sensation: WNL LUE Coordination: WNL    Lower Extremity Assessment Lower Extremity Assessment: Defer to PT evaluation   Cervical / Trunk Assessment Cervical / Trunk Assessment: Other exceptions Cervical / Trunk Exceptions: s/p cervical spine surgery; noted scar on lower back from prior back surgery   Communication Communication Communication: No difficulties   Cognition Arousal/Alertness: Awake/alert Behavior During Therapy: WFL for tasks assessed/performed Overall Cognitive Status: Within Functional Limits for tasks assessed                                 General Comments: demonstrated good adherence to cervical precautions   General Comments  vss throughout    Exercises     Shoulder Instructions      Home Living Family/patient expects to be discharged to:: Private residence Living Arrangements: Spouse/significant other Available Help at Discharge: Family Type of Home: House Home Access: Stairs to enter Secretary/administrator of Steps: 2 Entrance Stairs-Rails: None Home Layout: One level     Bathroom Shower/Tub: Walk-in Human resources officer: Standard(comfort height)     Home Equipment: Gilmer Mor - single point   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      Prior Functioning/Environment Level of Independence: Independent                 OT Problem List: Decreased range of motion;Impaired balance (sitting and/or standing);Decreased knowledge of precautions;Pain;Obesity      OT Treatment/Interventions:      OT Goals(Current goals can be found in the care plan section) Acute Rehab OT Goals Patient Stated Goal: return to independent OT Goal Formulation: With patient Time For Goal Achievement: 12/18/18 Potential to Achieve Goals: Good  OT Frequency:     Barriers to D/C:            Co-evaluation              AM-PAC OT "6 Clicks" Daily Activity     Outcome Measure Help from another person eating meals?: None Help from another person taking care of  personal grooming?: A Little Help from another person toileting, which includes using toliet, bedpan, or urinal?: A Little Help from another person bathing (including washing, rinsing, drying)?: A Little Help from another person to put on and taking off regular upper body clothing?: A Little Help from another person to put on and taking off regular lower body clothing?: A Little 6 Click Score: 19   End of Session Equipment Utilized During Treatment: Gait belt Nurse Communication: Mobility status  Activity Tolerance: Patient tolerated treatment well Patient left: in bed;with call bell/phone within reach  OT Visit Diagnosis: Other abnormalities of gait and mobility (R26.89);Pain Pain - part of body: (neck incision site)                Time: 5170-0174 OT Time Calculation (min): 21 min Charges:  OT General Charges $OT Visit: 1 Visit OT Evaluation $OT Eval Low Complexity: 1 Low  Diona Browner OTR/L Acute Rehabilitation Services Office: [phone]   Rebeca Alert 12/04/2018, 9:24 AM

## 2018-12-04 NOTE — Progress Notes (Signed)
Neurosurgery Service Progress Note  Subjective: No acute events overnight, anterior neck soreness, no dysphagia, no radicular pain   Objective: Vitals:   12/03/18 1607 12/03/18 1912 12/03/18 2311 12/04/18 0334  BP: (!) 149/84 (!) 149/88 113/61 101/68  Pulse: 61 67 82 91  Resp: 18 20 20 20   Temp: 97.7 F (36.5 C) 98.5 F (36.9 C) 98.8 F (37.1 C) 99 F (37.2 C)  TempSrc: Oral Oral Oral Oral  SpO2: 100% 100% 98% 97%  Weight:      Height:       Temp (24hrs), Avg:97.9 F (36.6 C), Min:97 F (36.1 C), Max:99 F (37.2 C)  CBC Latest Ref Rng & Units 11/29/2018 12/14/2017  WBC 4.0 - 10.5 K/uL 5.2 5.4  Hemoglobin 12.0 - 15.0 g/dL 13.5 12.8  Hematocrit 36.0 - 46.0 % 42.8 42.8  Platelets 150 - 400 K/uL 287 315   BMP Latest Ref Rng & Units 11/29/2018 12/14/2017  Glucose 70 - 99 mg/dL 147(H) 131(H)  BUN 6 - 20 mg/dL 10 14  Creatinine 0.44 - 1.00 mg/dL 0.81 0.84  Sodium 135 - 145 mmol/L 139 139  Potassium 3.5 - 5.1 mmol/L 4.0 3.5  Chloride 98 - 111 mmol/L 105 106  CO2 22 - 32 mmol/L 25 25  Calcium 8.9 - 10.3 mg/dL 9.2 8.9    Intake/Output Summary (Last 24 hours) at 12/04/2018 0704 Last data filed at 12/03/2018 1747 Gross per 24 hour  Intake 1860 ml  Output 100 ml  Net 1760 ml    Current Facility-Administered Medications:  .  0.9 %  sodium chloride infusion, 250 mL, Intravenous, Continuous, ,  A, MD .  acetaminophen (TYLENOL) tablet 650 mg, 650 mg, Oral, Q4H PRN, 650 mg at 12/04/18 0127 **OR** acetaminophen (TYLENOL) suppository 650 mg, 650 mg, Rectal, Q4H PRN, Judith Part, MD .  atorvastatin (LIPITOR) tablet 20 mg, 20 mg, Oral, Daily, ,  A, MD .  cyclobenzaprine (FLEXERIL) tablet 10 mg, 10 mg, Oral, TID PRN, Judith Part, MD, 10 mg at 12/04/18 0124 .  docusate sodium (COLACE) capsule 100 mg, 100 mg, Oral, BID, Judith Part, MD, 100 mg at 12/03/18 2025 .  HYDROmorphone (DILAUDID) injection 1 mg, 1 mg, Intravenous, Q3H PRN,  Judith Part, MD, 1 mg at 12/03/18 1339 .  irbesartan (AVAPRO) tablet 75 mg, 75 mg, Oral, Daily, Judith Part, MD, 75 mg at 12/03/18 1645 .  menthol-cetylpyridinium (CEPACOL) lozenge 3 mg, 1 lozenge, Oral, PRN **OR** phenol (CHLORASEPTIC) mouth spray 1 spray, 1 spray, Mouth/Throat, PRN, Judith Part, MD, 1 spray at 12/03/18 1554 .  metFORMIN (GLUCOPHAGE) tablet 1,000 mg, 1,000 mg, Oral, BID WC, , Joyice Faster, MD, 1,000 mg at 12/03/18 1644 .  ondansetron (ZOFRAN) tablet 4 mg, 4 mg, Oral, Q6H PRN **OR** ondansetron (ZOFRAN) injection 4 mg, 4 mg, Intravenous, Q6H PRN, ,  A, MD .  oxyCODONE (Oxy IR/ROXICODONE) immediate release tablet 10 mg, 10 mg, Oral, Q4H PRN, Judith Part, MD, 10 mg at 12/03/18 2025 .  oxyCODONE (Oxy IR/ROXICODONE) immediate release tablet 5 mg, 5 mg, Oral, Q4H PRN, Judith Part, MD, 5 mg at 12/04/18 0514 .  polyethylene glycol (MIRALAX / GLYCOLAX) packet 17 g, 17 g, Oral, Daily PRN, ,  A, MD .  sodium chloride flush (NS) 0.9 % injection 3 mL, 3 mL, Intravenous, Q12H, , Joyice Faster, MD, 3 mL at 12/03/18 2030 .  sodium chloride flush (NS) 0.9 % injection 3 mL, 3 mL, Intravenous, PRN, , Joyice Faster,  MD .  torsemide (DEMADEX) tablet 30 mg, 30 mg, Oral, Daily, , Clovis Pu, MD, 30 mg at 12/03/18 1645 .  zonisamide (ZONEGRAN) capsule 50 mg, 50 mg, Oral, BID, , Clovis Pu, MD, 50 mg at 12/03/18 2026   Physical Exam: AOx3, PERRL, EOMI, FS, Strength 5/5 x4, SILTx4, no drift  Assessment & Plan: 58 y.o. woman s/p C5-6 ACDF, recovering well.  -discharge home today  Angel Watkins  12/04/18 7:04 AM

## 2018-12-06 ENCOUNTER — Encounter (HOSPITAL_COMMUNITY): Payer: Self-pay | Admitting: Neurological Surgery

## 2019-09-24 ENCOUNTER — Other Ambulatory Visit (HOSPITAL_COMMUNITY): Payer: Self-pay | Admitting: Neurological Surgery

## 2019-09-24 DIAGNOSIS — M5416 Radiculopathy, lumbar region: Secondary | ICD-10-CM

## 2019-10-04 ENCOUNTER — Other Ambulatory Visit: Payer: Self-pay

## 2019-10-04 ENCOUNTER — Encounter (HOSPITAL_COMMUNITY): Payer: Self-pay | Admitting: *Deleted

## 2019-10-04 NOTE — Anesthesia Preprocedure Evaluation (Addendum)
Anesthesia Evaluation  Patient identified by MRN, date of birth, ID band Patient awake    Reviewed: Allergy & Precautions, NPO status , Patient's Chart, lab work & pertinent test results  History of Anesthesia Complications Negative for: history of anesthetic complications  Airway Mallampati: III  TM Distance: >3 FB Neck ROM: Full    Dental  (+) Dental Advisory Given   Pulmonary sleep apnea and Continuous Positive Airway Pressure Ventilation , neg COPD, neg recent URI,  Covid-19 Nucleic Acid Test Results Lab Results      Component                Value               Date                      SARSCOV2NAA              NEGATIVE            10/05/2019                SARSCOV2NAA              NOT DETECTED        11/29/2018              breath sounds clear to auscultation       Cardiovascular hypertension, Pt. on medications (-) angina(-) Past MI and (-) CHF  Rhythm:Regular     Neuro/Psych PSYCHIATRIC DISORDERS Anxiety  Neuromuscular disease    GI/Hepatic negative GI ROS, Neg liver ROS,   Endo/Other  diabetes, Type 2, Oral Hypoglycemic AgentsMorbid obesity  Renal/GU negative Renal ROSLab Results      Component                Value               Date                      CREATININE               0.92                10/08/2019           Lab Results      Component                Value               Date                      K                        4.5                 10/08/2019                Musculoskeletal  (+) Arthritis , Fibromyalgia -  Abdominal   Peds  Hematology Lab Results      Component                Value               Date                      WBC  5.5                 10/08/2019                HGB                      14.0                10/08/2019                HCT                      45.5                10/08/2019                MCV                      96.0                10/08/2019                 PLT                      283                 10/08/2019              Anesthesia Other Findings Patient is a 59 year old female scheduled for the above procedure.  MRI was ordered by neurosurgeon [name], MD with visit/H&P from 09/13/19.   History includes never smoker, HTN, DM2, OSA (CPAP), fibromyalgia, anxiety, gastric bypass (2002), hysterectomy, lumbar laminectomy, C5-6 ACDF 12/03/18.   She was evaluated at [facility] on 09/27/19 by Tiburcio Pea, MD for palpitations. She had a heart monitor that "showed rare PVCs and PACs but were present during her triggered events.  We discussed conservative therapy vs starting a beta blocker and at this time the episodes are not bothersome enough for her to more medication."  No further evaluation recommended with as needed cardiology follow-up. (See Care Everywhere)  Reproductive/Obstetrics                            Anesthesia Physical Anesthesia Plan  ASA: III  Anesthesia Plan: General   Post-op Pain Management:    Induction: Intravenous  PONV Risk Score and Plan: 3 and Ondansetron and Dexamethasone  Airway Management Planned: Oral ETT  Additional Equipment: None  Intra-op Plan:   Post-operative Plan: Extubation in OR  Informed Consent: I have reviewed the patients History and Physical, chart, labs and discussed the procedure including the risks, benefits and alternatives for the proposed anesthesia with the patient or authorized representative who has indicated his/her understanding and acceptance.     Dental advisory given  Plan Discussed with: CRNA  Anesthesia Plan Comments: (PAT note written 10/04/2019 by Shonna Chock, PA-C. )       Anesthesia Quick Evaluation

## 2019-10-04 NOTE — Progress Notes (Addendum)
Anesthesia Chart Review: Angel Watkins   Case: [id number] Date/Time: 10/08/19 0800   Procedure: MRI WITH ANESTHESIA    LUMBAR WITHOUT (N/A )   Anesthesia type: General   Pre-op diagnosis: LUMBAR RIDULOPATHY   Location: [facility] RAD[facility] / [facility] OR   Surgeons: Radiologist, Medication, MD      DISCUSSION: Patient is a 59 year old female scheduled for the above procedure.  MRI was ordered by neurosurgeon [name], MD with visit/H&P from 09/13/19.   History includes never smoker, HTN, DM2, OSA (CPAP), fibromyalgia, anxiety, gastric bypass (2002), hysterectomy, lumbar laminectomy, C5-6 ACDF 12/03/18.   She was evaluated at [facility] on 09/27/19 by Tiburcio Pea, MD for palpitations. She had a heart monitor that "showed rare PVCs and PACs but were present during her triggered events.  We discussed conservative therapy vs starting a beta blocker and at this time the episodes are not bothersome enough for her to more medication."  No further evaluation recommended with as needed cardiology follow-up. (See Care Everywhere)  Preprocedure COVID-19 test is scheduled for 10/05/2019.  Anesthesia team to evaluate on the day of procedure.   VS: 09/27/19 at [facility] H&V: Blood Pressure 114/72  Pulse 69  Respiratory Rate 16  Oxygen Saturation 99%  Weight 154.6 kg (340 lb 14.4 oz)  Height 165.1 cm (5\' 5" )  Body Mass Index 56.73    PROVIDERS , FNP is PCP    LABS: Last labs noted were from 06/27/2019 [facility] Care Everywhere) and showed normal CBC, creatinine 0.94, normal LFTs, normal TSH, A1c 6.7%.   EKG: EKG 11/29/18: Normal sinus rhythm with sinus arrhythmia RSR' or QR pattern in V1 suggests right ventricular conduction delay Borderline ECG No significant change since last tracing Confirmed by 12/01/18 ([id number]) on 11/29/2018 4:28:28 PM   CV:  Cardiac monitor 7-14 days 07/26/19: Per 09/27/19 [facility] Care Everywhere note by [facility], MD,  heart monitor "showed rare PVCs and PACs but were present during her triggered events." Symptoms not bothersome enough for patient to try b-blocker, so observation recommended with as needed cardiology follow-up.     Past Medical History:  Diagnosis Date  . Anxiety   . Arthritis   . Diabetes mellitus without complication (HCC)   . Fibromyalgia   . Hypertension   . Sleep apnea     Past Surgical History:  Procedure Laterality Date  . ABDOMINAL HYSTERECTOMY    . ANTERIOR CERVICAL DECOMP/DISCECTOMY FUSION N/A 12/03/2018   Procedure: Cervical five-six  Anterior cervical decompression/discectomy/fusion;  Surgeon: 13/03/2018, MD;  Location: [facility] OR;  Service: Neurosurgery;  Laterality: N/A;  . BUNIONECTOMY Left 2009  . GASTRIC BYPASS    . LUMBAR LAMINECTOMY    . RADIOLOGY WITH ANESTHESIA N/A 12/14/2017   Procedure: MRI WITH ANESTHESIA, LUMBAR SPINE WITHOUT CONTRAST, CERVICAL SPINE WITHOUT CONTRAST;  Surgeon: Radiologist, Medication, MD;  Location: [facility] OR;  Service: Radiology;  Laterality: N/A;    MEDICATIONS: No current facility-administered medications for this encounter.   12/16/2017 atorvastatin (LIPITOR) 20 MG tablet  . calcium carbonate (OSCAL) 1500 (600 Ca) MG TABS tablet  . cholecalciferol (VITAMIN D3) 25 MCG (1000 UNIT) tablet  . Cyanocobalamin (VITAMIN B-12 PO)  . Diclofenac Sodium CR 100 MG 24 hr tablet  . ferrous sulfate 325 (65 FE) MG tablet  . irbesartan (AVAPRO) 75 MG tablet  . metFORMIN (GLUCOPHAGE) 1000 MG tablet  . torsemide (DEMADEX) 20 MG tablet  . traMADol (ULTRAM) 50 MG tablet  .  traZODone (DESYREL) 50 MG tablet    Shonna Chock, PA-C Surgical Short Stay/Anesthesiology [facility] Phone [phone] [facility] Phone [phone] 10/04/2019 11:57 AM

## 2019-10-04 NOTE — Progress Notes (Signed)
Nurse lvm with Denny Peon, Secretary at [facility] regarding an updated H&P; awaiting a return call.

## 2019-10-04 NOTE — Progress Notes (Signed)
Pt denies SOB, chest pain, and being under the care of a cardiologist. Pt stated that she saw Dr. Delton Coombes, Cardiology, once and she does not need to follow up with him.  Pt denies having a stress test, echo and cardiac cath. Pt denies recent labs. Pt made aware to stop taking vitamins and herbal medications. Pt made aware to hold Metformin on DOS. Pt stated that she does not check her CBG. Pt reminded to quarantine. Pt verbalized understanding of all pre-op instructions. PA, Anesthesiology, asked to review pt history, see note.

## 2019-10-05 ENCOUNTER — Other Ambulatory Visit (HOSPITAL_COMMUNITY)
Admission: RE | Admit: 2019-10-05 | Discharge: 2019-10-05 | Disposition: A | Discharge status: Home / Self Care | Payer: 59 | Source: Ambulatory Visit | Attending: Neurological Surgery | Admitting: Neurological Surgery

## 2019-10-05 DIAGNOSIS — Z20822 Contact with and (suspected) exposure to covid-19: Secondary | ICD-10-CM | POA: Diagnosis not present

## 2019-10-05 DIAGNOSIS — Z01812 Encounter for preprocedural laboratory examination: Secondary | ICD-10-CM | POA: Diagnosis not present

## 2019-10-05 LAB — SARS CORONAVIRUS 2 (TAT 6-24 HRS): SARS Coronavirus 2: NEGATIVE

## 2019-10-08 ENCOUNTER — Encounter (HOSPITAL_COMMUNITY)
Admission: RE | Disposition: A | Discharge status: Home / Self Care | Payer: Self-pay | Source: Home / Self Care | Attending: Neurological Surgery

## 2019-10-08 ENCOUNTER — Ambulatory Visit (HOSPITAL_COMMUNITY)
Admission: RE | Admit: 2019-10-08 | Discharge: 2019-10-08 | Disposition: A | Discharge status: Home / Self Care | Payer: 59 | Source: Ambulatory Visit | Attending: Neurological Surgery | Admitting: Neurological Surgery

## 2019-10-08 ENCOUNTER — Ambulatory Visit (HOSPITAL_COMMUNITY): Payer: 59 | Admitting: Vascular Surgery

## 2019-10-08 ENCOUNTER — Ambulatory Visit (HOSPITAL_COMMUNITY)
Admission: RE | Admit: 2019-10-08 | Discharge: 2019-10-08 | Disposition: A | Discharge status: Home / Self Care | Payer: 59 | Attending: Neurological Surgery | Admitting: Neurological Surgery

## 2019-10-08 DIAGNOSIS — Z981 Arthrodesis status: Secondary | ICD-10-CM | POA: Insufficient documentation

## 2019-10-08 DIAGNOSIS — Z9884 Bariatric surgery status: Secondary | ICD-10-CM | POA: Insufficient documentation

## 2019-10-08 DIAGNOSIS — Z79899 Other long term (current) drug therapy: Secondary | ICD-10-CM | POA: Diagnosis not present

## 2019-10-08 DIAGNOSIS — Z6841 Body Mass Index (BMI) 40.0 and over, adult: Secondary | ICD-10-CM | POA: Diagnosis not present

## 2019-10-08 DIAGNOSIS — M5416 Radiculopathy, lumbar region: Secondary | ICD-10-CM | POA: Diagnosis present

## 2019-10-08 DIAGNOSIS — M4607 Spinal enthesopathy, lumbosacral region: Secondary | ICD-10-CM | POA: Insufficient documentation

## 2019-10-08 DIAGNOSIS — M4726 Other spondylosis with radiculopathy, lumbar region: Secondary | ICD-10-CM | POA: Insufficient documentation

## 2019-10-08 DIAGNOSIS — G4733 Obstructive sleep apnea (adult) (pediatric): Secondary | ICD-10-CM | POA: Insufficient documentation

## 2019-10-08 DIAGNOSIS — M5116 Intervertebral disc disorders with radiculopathy, lumbar region: Secondary | ICD-10-CM | POA: Insufficient documentation

## 2019-10-08 DIAGNOSIS — E119 Type 2 diabetes mellitus without complications: Secondary | ICD-10-CM | POA: Insufficient documentation

## 2019-10-08 DIAGNOSIS — I10 Essential (primary) hypertension: Secondary | ICD-10-CM | POA: Diagnosis not present

## 2019-10-08 DIAGNOSIS — M48061 Spinal stenosis, lumbar region without neurogenic claudication: Secondary | ICD-10-CM | POA: Insufficient documentation

## 2019-10-08 HISTORY — DX: Palpitations: R00.2

## 2019-10-08 HISTORY — DX: Presence of spectacles and contact lenses: Z97.3

## 2019-10-08 HISTORY — PX: RADIOLOGY WITH ANESTHESIA: SHX6223

## 2019-10-08 LAB — BASIC METABOLIC PANEL
Anion gap: 13 (ref 5–15)
BUN: 13 mg/dL (ref 6–20)
CO2: 21 mmol/L — ABNORMAL LOW (ref 22–32)
Calcium: 9.2 mg/dL (ref 8.9–10.3)
Chloride: 106 mmol/L (ref 98–111)
Creatinine, Ser: 0.92 mg/dL (ref 0.44–1.00)
GFR calc Af Amer: >60 mL/min (ref >60)
GFR calc non Af Amer: >60 mL/min (ref >60)
Glucose, Bld: 90 mg/dL (ref 70–99)
Potassium: 4.5 mmol/L (ref 3.5–5.1)
Sodium: 140 mmol/L (ref 135–145)

## 2019-10-08 LAB — CBC
HCT: 45.5 % (ref 36.0–46.0)
Hemoglobin: 14 g/dL (ref 12.0–15.0)
MCH: 29.5 pg (ref 26.0–34.0)
MCHC: 30.8 g/dL (ref 30.0–36.0)
MCV: 96 fL (ref 80.0–100.0)
Platelets: 283 10*3/uL (ref 150–400)
RBC: 4.74 MIL/uL (ref 3.87–5.11)
RDW: 12.5 % (ref 11.5–15.5)
WBC: 5.5 10*3/uL (ref 4.0–10.5)
nRBC: 0 % (ref 0.0–0.2)

## 2019-10-08 LAB — GLUCOSE, CAPILLARY
Glucose-Capillary: 92 mg/dL (ref 70–99)
Glucose-Capillary: 83 mg/dL (ref 70–99)

## 2019-10-08 SURGERY — MRI WITH ANESTHESIA
Anesthesia: General

## 2019-10-08 MED ORDER — LACTATED RINGERS IV SOLN: INTRAVENOUS | Status: DC

## 2019-10-08 MED ORDER — MIDAZOLAM HCL 2 MG/2ML IJ SOLN
INTRAMUSCULAR | Status: DC | PRN
  Administered 2019-10-08: 1 mg via INTRAVENOUS
  Administered 2019-10-08: 2 mg via INTRAVENOUS
  Administered 2019-10-08: 1 mg via INTRAVENOUS

## 2019-10-08 MED ORDER — CHLORHEXIDINE GLUCONATE 0.12 % MT SOLN
15.0000 mL | Freq: Once | OROMUCOSAL | Status: AC
  Administered 2019-10-08: 15 mL via OROMUCOSAL

## 2019-10-08 MED ORDER — ORAL CARE MOUTH RINSE: 15.0000 mL | Freq: Once | OROMUCOSAL | Status: AC

## 2019-10-08 MED ORDER — PROPOFOL 10 MG/ML IV BOLUS
INTRAVENOUS | Status: DC | PRN
  Administered 2019-10-08: 140 mg via INTRAVENOUS

## 2019-10-08 MED ORDER — SUGAMMADEX SODIUM 200 MG/2ML IV SOLN
INTRAVENOUS | Status: DC | PRN
  Administered 2019-10-08: 200 mg via INTRAVENOUS

## 2019-10-08 MED ORDER — ROCURONIUM BROMIDE 10 MG/ML (PF) SYRINGE
PREFILLED_SYRINGE | INTRAVENOUS | Status: DC | PRN
  Administered 2019-10-08: 70 mg via INTRAVENOUS

## 2019-10-08 NOTE — Transfer of Care (Signed)
Immediate Anesthesia Transfer of Care Note  Patient: Angel Watkins  Procedure(s) Performed: MRI WITH ANESTHESIA    LUMBAR WITHOUT (N/A )  Patient Location: PACU  Anesthesia Type:General  Level of Consciousness: drowsy and patient cooperative  Airway & Oxygen Therapy: Patient Spontanous Breathing  Post-op Assessment: Report given to RN and Post -op Vital signs reviewed and stable  Post vital signs: Reviewed and stable  Last Vitals:  Vitals Value Taken Time  BP 135/86 10/08/19 0921  Temp    Pulse 81 10/08/19 0921  Resp 18 10/08/19 0921  SpO2 97 % 10/08/19 0921  Vitals shown include unvalidated device data.  Last Pain:  Vitals:   10/08/19 0705  TempSrc:   PainSc: 0-No pain         Complications: No complications documented.

## 2019-10-08 NOTE — Anesthesia Procedure Notes (Signed)
Procedure Name: Intubation Date/Time: 10/08/2019 8:48 AM Performed by: Kathryne Hitch, CRNA Pre-anesthesia Checklist: Patient identified, Emergency Drugs available, Suction available and Patient being monitored Patient Re-evaluated:Patient Re-evaluated prior to induction Oxygen Delivery Method: Circle system utilized Preoxygenation: Pre-oxygenation with 100% oxygen Induction Type: IV induction Ventilation: Mask ventilation without difficulty Laryngoscope Size: Mac and 4 Grade View: Grade II Tube type: Oral Tube size: 7.5 mm Number of attempts: 2 Airway Equipment and Method: Stylet and Oral airway Placement Confirmation: ETT inserted through vocal cords under direct vision,  positive ETCO2 and breath sounds checked- equal and bilateral Secured at: 23 cm Tube secured with: Tape Dental Injury: Teeth and Oropharynx as per pre-operative assessment  Comments: [name] intubated patient uneventfully. VSS, +ETC02, BBS=

## 2019-10-08 NOTE — Anesthesia Postprocedure Evaluation (Signed)
Anesthesia Post Note  Patient: Angel Watkins  Procedure(s) Performed: MRI WITH ANESTHESIA    LUMBAR WITHOUT (N/A )     Patient location during evaluation: PACU Anesthesia Type: General Level of consciousness: awake and alert Pain management: pain level controlled Vital Signs Assessment: post-procedure vital signs reviewed and stable Respiratory status: spontaneous breathing, nonlabored ventilation, respiratory function stable and patient connected to nasal cannula oxygen Cardiovascular status: blood pressure returned to baseline and stable Postop Assessment: no apparent nausea or vomiting Anesthetic complications: no   No complications documented.  Last Vitals:  Vitals:   10/08/19 0939 10/08/19 0951  BP: 122/82 125/84  Pulse: 73 62  Resp: 13 15  Temp:  36.7 C  SpO2: 100% 98%    Last Pain:  Vitals:   10/08/19 0951  TempSrc:   PainSc: 0-No pain                 Erasmo Vertz

## 2019-10-09 ENCOUNTER — Encounter (HOSPITAL_COMMUNITY): Payer: Self-pay | Admitting: Radiology

## 2020-04-22 ENCOUNTER — Other Ambulatory Visit: Payer: Self-pay

## 2020-04-22 ENCOUNTER — Ambulatory Visit (INDEPENDENT_AMBULATORY_CARE_PROVIDER_SITE_OTHER): Payer: 59 | Admitting: Family Medicine

## 2020-04-22 ENCOUNTER — Encounter (INDEPENDENT_AMBULATORY_CARE_PROVIDER_SITE_OTHER): Payer: Self-pay | Admitting: Family Medicine

## 2020-04-22 VITALS — BP 121/78 | HR 79 | Temp 98.4°F | Ht 64.0 in | Wt 333.0 lb

## 2020-04-22 DIAGNOSIS — R5383 Other fatigue: Secondary | ICD-10-CM | POA: Diagnosis not present

## 2020-04-22 DIAGNOSIS — E1159 Type 2 diabetes mellitus with other circulatory complications: Secondary | ICD-10-CM

## 2020-04-22 DIAGNOSIS — E559 Vitamin D deficiency, unspecified: Secondary | ICD-10-CM

## 2020-04-22 DIAGNOSIS — R0602 Shortness of breath: Secondary | ICD-10-CM

## 2020-04-22 DIAGNOSIS — Z9189 Other specified personal risk factors, not elsewhere classified: Secondary | ICD-10-CM | POA: Diagnosis not present

## 2020-04-22 DIAGNOSIS — Z0289 Encounter for other administrative examinations: Secondary | ICD-10-CM

## 2020-04-22 DIAGNOSIS — E1165 Type 2 diabetes mellitus with hyperglycemia: Secondary | ICD-10-CM | POA: Diagnosis not present

## 2020-04-22 DIAGNOSIS — Z6841 Body Mass Index (BMI) 40.0 and over, adult: Secondary | ICD-10-CM

## 2020-04-22 DIAGNOSIS — I152 Hypertension secondary to endocrine disorders: Secondary | ICD-10-CM

## 2020-04-23 LAB — CBC WITH DIFFERENTIAL/PLATELET
Basophils Absolute: 0.1 10*3/uL (ref 0.0–0.2)
Basos: 1 %
EOS (ABSOLUTE): 0.1 10*3/uL (ref 0.0–0.4)
Eos: 1 %
Hematocrit: 43.1 % (ref 34.0–46.6)
Hemoglobin: 14.6 g/dL (ref 11.1–15.9)
Immature Grans (Abs): 0 10*3/uL (ref 0.0–0.1)
Immature Granulocytes: 0 %
Lymphocytes Absolute: 1.8 10*3/uL (ref 0.7–3.1)
Lymphs: 24 %
MCH: 31.2 pg (ref 26.6–33.0)
MCHC: 33.9 g/dL (ref 31.5–35.7)
MCV: 92 fL (ref 79–97)
Monocytes Absolute: 0.6 10*3/uL (ref 0.1–0.9)
Monocytes: 8 %
Neutrophils Absolute: 4.9 10*3/uL (ref 1.4–7.0)
Neutrophils: 66 %
Platelets: 351 10*3/uL (ref 150–450)
RBC: 4.68 x10E6/uL (ref 3.77–5.28)
RDW: 11.9 % (ref 11.7–15.4)
WBC: 7.4 10*3/uL (ref 3.4–10.8)

## 2020-04-23 LAB — COMPREHENSIVE METABOLIC PANEL
ALT: 23 IU/L (ref 0–32)
AST: 12 IU/L (ref 0–40)
Albumin/Globulin Ratio: 2.2 (ref 1.2–2.2)
Albumin: 4.2 g/dL (ref 3.8–4.9)
Alkaline Phosphatase: 88 IU/L (ref 44–121)
BUN/Creatinine Ratio: 19 (ref 9–23)
BUN: 20 mg/dL (ref 6–24)
Bilirubin Total: 0.5 mg/dL (ref 0.0–1.2)
CO2: 24 mmol/L (ref 20–29)
Calcium: 9.9 mg/dL (ref 8.7–10.2)
Chloride: 101 mmol/L (ref 96–106)
Creatinine, Ser: 1.04 mg/dL — ABNORMAL HIGH (ref 0.57–1.00)
Globulin, Total: 1.9 g/dL (ref 1.5–4.5)
Glucose: 103 mg/dL — ABNORMAL HIGH (ref 65–99)
Potassium: 4.8 mmol/L (ref 3.5–5.2)
Sodium: 142 mmol/L (ref 134–144)
Total Protein: 6.1 g/dL (ref 6.0–8.5)
eGFR: 63 mL/min/{1.73_m2} (ref >59)

## 2020-04-23 LAB — VITAMIN B12: Vitamin B-12: >2000 pg/mL — ABNORMAL HIGH (ref 232–1245)

## 2020-04-23 LAB — HEMOGLOBIN A1C
Est. average glucose Bld gHb Est-mCnc: 140 mg/dL
Hgb A1c MFr Bld: 6.5 % — ABNORMAL HIGH (ref 4.8–5.6)

## 2020-04-23 LAB — FOLATE: Folate: 4 ng/mL (ref >3.0)

## 2020-04-23 LAB — T4: T4, Total: 7.8 ug/dL (ref 4.5–12.0)

## 2020-04-23 LAB — INSULIN, RANDOM: INSULIN: 10.8 u[IU]/mL (ref 2.6–24.9)

## 2020-04-23 LAB — LIPID PANEL WITH LDL/HDL RATIO
Cholesterol, Total: 143 mg/dL (ref 100–199)
HDL: 67 mg/dL (ref >39)
LDL Chol Calc (NIH): 64 mg/dL (ref 0–99)
LDL/HDL Ratio: 1 ratio (ref 0.0–3.2)
Triglycerides: 56 mg/dL (ref 0–149)
VLDL Cholesterol Cal: 12 mg/dL (ref 5–40)

## 2020-04-23 LAB — VITAMIN D 25 HYDROXY (VIT D DEFICIENCY, FRACTURES): Vit D, 25-Hydroxy: 46.4 ng/mL (ref 30.0–100.0)

## 2020-04-23 LAB — TSH: TSH: 1.67 u[IU]/mL (ref 0.450–4.500)

## 2020-04-23 LAB — T3: T3, Total: 99 ng/dL (ref 71–180)

## 2020-04-27 NOTE — Progress Notes (Signed)
Dear Dr. Darrelyn Watkins,   Thank you for referring Angel Watkins to our clinic. The following note includes my evaluation and treatment recommendations.  Chief Complaint:   OBESITY Angel Watkins (MR# [id number]) is a 60 y.o. female who presents for evaluation and treatment of obesity and related comorbidities. Current BMI is Body mass index is 57.16 kg/m. Angel Watkins has been struggling with her weight for many years and has been unsuccessful in either losing weight, maintaining weight loss, or reaching her healthy weight goal.  Angel Watkins is currently in the action stage of change and ready to dedicate time achieving and maintaining a healthier weight. Angel Watkins is interested in becoming our patient and working on intensive lifestyle modifications including (but not limited to) diet and exercise for weight loss.  Angel Watkins was referred by Dr. Darrelyn Watkins at [facility]. She had gastric bypass in 2002 and still has quite a bit of restriction. Pt is an Environmental health practitioner for [employer]. She is skipping dinner frequently. Coffee 2 cups with nondairy creamer (1 tsp), Splenda (4-5 packs/cup); if she has breakfast, it's oatmeal (1 pack apple cinnamon) and sausage (1 pork patty) (full); yogurt (1 small container) + 1 boiled egg between 6-7 AM; at 10 AM not hungry bu knows she needs to eat/drink. May get bottle of water or yogurt or banana; Lunch- Pita Delight steak and cheese pita- sometimes eats whole pita (normally 3/4) (feel full); Maybe have sweet at night- oatmeal cakes or ice cream sandwiches.   Angel Watkins's habits were reviewed today and are as follows: she thinks her family will eat healthier with her, her desired weight loss is 108 lbs, she has been heavy most of her life, she started gaining weight in her mid to late 30's, her heaviest weight ever was 399 pounds, she has significant food cravings issues, she snacks frequently in the evenings, she skips meals frequently, she is frequently  drinking liquids with calories, she frequently makes poor food choices and she struggles with emotional eating.  Depression Screen Angel Watkins (modified PHQ-9) score was 15.  Depression screen Angel Watkins 2/9 04/22/2020  Decreased Interest 3  Down, Depressed, Hopeless 1  PHQ - 2 Score 4  Altered sleeping 2  Tired, decreased energy 3  Change in appetite 1  Feeling bad or failure about yourself  1  Trouble concentrating 2  Moving slowly or fidgety/restless 2  Suicidal thoughts 0  PHQ-9 Score 15  Difficult doing work/chores Somewhat difficult   Subjective:   1. Other fatigue Angel Watkins admits to daytime somnolence and admits to waking up still tired. Patent has a history of symptoms of morning fatigue. Angel Watkins generally gets 6 hours of sleep per night, and states that she has poor sleep quality. Snoring is present. Apneic episodes are not present. Epworth Sleepiness Score is 3. EKG- RSR in V1; possible atrial enlargement.  2. SOB (shortness of breath) on exertion Angel Watkins notes increasing shortness of breath with exercising and seems to be worsening over time with weight gain. She notes getting out of breath sooner with activity than she used to. This has gotten worse recently. Angel Watkins denies shortness of breath at rest or orthopnea. EKG- RSR in V1; possible atrial enlargement.  3. Type 2 diabetes mellitus with hyperglycemia, without long-term current use of insulin (HCC) Angel Watkins is on Metformin 1000 mg 1 tab daily. Her last A1c was 6.7. her last eye exam was June 2021. She is not getting her feet checked. She was diagnosed in the early 1990's.  4. Hypertension associated with diabetes (HCC) Angel Watkins was diagnosed even before her diabetes diagnosis.  Pt denies chest pain, chest pressure and headache. She is on irbesartan 75 mg daily.   5. Vitamin Angel Watkins deficiency Angel Watkins is taking Vit D3 25 mcg daily. She reports fatigue.  6. At risk for heart disease Angel Watkins is at higher risk of osteopenia and  osteoporosis due to Vitamin Angel Watkins deficiency.   Assessment/Plan:   1. Other fatigue Angel Watkins does feel that her weight is causing her energy to be lower than it should be. Fatigue may be related to obesity, depression or many other causes. Labs will be ordered, and in the meanwhile, [name] will focus on self care including making healthy food choices, increasing physical activity and focusing on stress reduction. Check labs today.  - EKG 12-Lead - Vitamin B12 - CBC with Differential/Platelet - Folate - T3 - T4 - TSH  2. SOB (shortness of breath) on exertion Angel Watkins does feel that she gets out of breath more easily that she used to when she exercises. Angel Watkins's shortness of breath appears to be obesity related and exercise induced. She has agreed to work on weight loss and gradually increase exercise to treat her exercise induced shortness of breath. Will continue to monitor closely.  3. Type 2 diabetes mellitus with hyperglycemia, without long-term current use of insulin (HCC) [name], neuropathy, limb loss, blindness, coronary artery disease, and death. Intensive lifestyle modification including diet, [name]  - Hemoglobin A1c - Insulin, random - Lipid Panel With LDL/HDL Ratio  4. Hypertension associated with diabetes (HCC) Angel Watkins is working on healthy weight loss and exercise to improve blood pressure control. We will watch for signs of hypotension as she continues her lifestyle modifications. Check labs today.  - Comprehensive metabolic panel  5. Vitamin Angel Watkins deficiency Low Vitamin Angel Watkins level contributes to fatigue and are associated with obesity, breast, and colon cancer. She agrees to continue to take OTC Vitamin Angel Watkins @1 ,000 IU daily and will follow-up for routine testing of Vitamin Angel Watkins, at least 2-3 times per year to  avoid over-replacement. Check labs today.  - VITAMIN Angel Watkins 25 Hydroxy (Vit-Angel Watkins Deficiency, Fractures)  6. At risk for heart disease Angel Watkins was given approximately 15 minutes of osteoporosis prevention counseling today. Angel Watkins is at risk for osteopenia and osteoporosis due to her Vitamin Angel Watkins deficiency. She was encouraged to take her Vitamin Angel Watkins and follow her higher calcium diet and increase strengthening exercise to help strengthen her bones and decrease her risk of osteopenia and osteoporosis.  Repetitive spaced learning was employed today to elicit superior memory formation and behavioral change.  7. Class 3 severe obesity with serious comorbidity and body mass index (BMI) of 50.0 to 59.9 in adult, unspecified obesity type [facility]) Angel Watkins is currently in the action stage of change and her goal is to continue with weight loss efforts. I recommend Angel Watkins begin the structured treatment plan as follows:  She has agreed to the Category 2 Plan + 100 calories.  Exercise goals: No exercise has been prescribed at this time.   Behavioral modification strategies: increasing lean protein intake, meal planning and cooking strategies, keeping healthy foods in the home and planning for success.  She was informed of the importance of frequent follow-up visits to maximize her success with intensive lifestyle modifications for her multiple health conditions. She was  informed we would discuss her lab results at her next visit unless there is a critical issue that needs to be addressed sooner. Angel Watkins agreed to keep her next visit at the agreed upon time to discuss these results.  Objective:   Blood pressure 121/78, pulse 79, temperature 98.4 F (36.9 C), temperature source Oral, height 5\' 4"  (1.626 m), weight (!) 333 lb (151 kg), SpO2 98 %. Body mass index is 57.16 kg/m.  EKG: RSR in V1, rate 87.  Indirect Calorimeter completed today shows a VO2 of 219 and a REE of 1524.  Her calculated basal metabolic rate is  thus her basal metabolic rate is worse than expected.  General: Cooperative, alert, well developed, in no acute distress. HEENT: Conjunctivae and lids unremarkable. Cardiovascular: Regular rhythm.  Lungs: Normal work of breathing. Neurologic: No focal deficits.   Lab Results  Component Value Date   CREATININE 1.04 (H) 04/22/2020   BUN 20 04/22/2020   NA 142 04/22/2020   K 4.8 04/22/2020   CL 101 04/22/2020   CO2 24 04/22/2020   Lab Results  Component Value Date   ALT 23 04/22/2020   AST 12 04/22/2020   ALKPHOS 88 04/22/2020   BILITOT 0.5 04/22/2020   Lab Results  Component Value Date   HGBA1C 6.5 (H) 04/22/2020   HGBA1C 6.2 (H) 11/29/2018   Lab Results  Component Value Date   INSULIN 10.8 04/22/2020   Lab Results  Component Value Date   TSH 1.670 04/22/2020   Lab Results  Component Value Date   CHOL 143 04/22/2020   HDL 67 04/22/2020   LDLCALC 64 04/22/2020   TRIG 56 04/22/2020   Lab Results  Component Value Date   WBC 7.4 04/22/2020   HGB 14.6 04/22/2020   HCT 43.1 04/22/2020   MCV 92 04/22/2020   PLT 351 04/22/2020    Attestation Statements:   Reviewed by clinician on day of visit: allergies, medications, problem list, medical history, surgical history, family history, social history, and previous encounter notes.  04/24/2020, am acting as transcriptionist for Edmund Hilda, MD.   I have reviewed the above documentation for accuracy and completeness, and I agree with the above. - Reuben Likes, MD

## 2020-05-04 ENCOUNTER — Encounter (INDEPENDENT_AMBULATORY_CARE_PROVIDER_SITE_OTHER): Payer: Self-pay | Admitting: Family Medicine

## 2020-05-06 ENCOUNTER — Encounter (INDEPENDENT_AMBULATORY_CARE_PROVIDER_SITE_OTHER): Payer: Self-pay | Admitting: Family Medicine

## 2020-05-06 ENCOUNTER — Ambulatory Visit (INDEPENDENT_AMBULATORY_CARE_PROVIDER_SITE_OTHER): Payer: 59 | Admitting: Family Medicine

## 2020-05-06 ENCOUNTER — Other Ambulatory Visit: Payer: Self-pay

## 2020-05-06 VITALS — BP 123/94 | HR 91 | Temp 98.4°F | Ht 64.0 in | Wt 327.0 lb

## 2020-05-06 DIAGNOSIS — Z9189 Other specified personal risk factors, not elsewhere classified: Secondary | ICD-10-CM | POA: Diagnosis not present

## 2020-05-06 DIAGNOSIS — E559 Vitamin D deficiency, unspecified: Secondary | ICD-10-CM | POA: Diagnosis not present

## 2020-05-06 DIAGNOSIS — E1165 Type 2 diabetes mellitus with hyperglycemia: Secondary | ICD-10-CM | POA: Diagnosis not present

## 2020-05-06 DIAGNOSIS — E1169 Type 2 diabetes mellitus with other specified complication: Secondary | ICD-10-CM

## 2020-05-06 DIAGNOSIS — E1159 Type 2 diabetes mellitus with other circulatory complications: Secondary | ICD-10-CM | POA: Diagnosis not present

## 2020-05-06 DIAGNOSIS — I152 Hypertension secondary to endocrine disorders: Secondary | ICD-10-CM

## 2020-05-06 DIAGNOSIS — Z6841 Body Mass Index (BMI) 40.0 and over, adult: Secondary | ICD-10-CM

## 2020-05-06 DIAGNOSIS — E785 Hyperlipidemia, unspecified: Secondary | ICD-10-CM

## 2020-05-06 MED ORDER — OZEMPIC (0.25 OR 0.5 MG/DOSE) 2 MG/1.5ML ~~LOC~~ SOPN
0.5000 mg | PEN_INJECTOR | SUBCUTANEOUS | 0 refills | Status: DC
Start: 2020-05-06 — End: 2020-06-15

## 2020-05-12 ENCOUNTER — Telehealth (INDEPENDENT_AMBULATORY_CARE_PROVIDER_SITE_OTHER): Payer: Self-pay | Admitting: Emergency Medicine

## 2020-05-12 NOTE — Telephone Encounter (Signed)
PA Approved for Ozempic (Key: [id number]) - [id number] Ozempic (0.25 or 0.5 MG/DOSE) 2MG /1.5ML pen-injectors     Status: PA Response - Approved

## 2020-05-12 NOTE — Telephone Encounter (Signed)
PA Initiated via CoverMyMeds for [employer] 0.25mg   (Key: [id number]) Ozempic (0.25 or 0.5 MG/DOSE) 2MG /1.5ML pen-injectors DX E11.65 Type 2 Diabetes

## 2020-05-13 NOTE — Telephone Encounter (Signed)
No action needed

## 2020-05-13 NOTE — Progress Notes (Signed)
Chief Complaint:   OBESITY Angel Watkins is here to discuss her progress with her obesity treatment plan along with follow-up of her obesity related diagnoses. Angel Watkins is on the Category 2 Plan + 100 and states she is following her eating plan approximately 85% of the time. Angel Watkins states she is not currently exercising.  Today's visit was #: 2 Starting weight: 333 lbs Starting date: 04/22/2020 Today's weight: 327 lbs Today's date: 05/06/2020 Total lbs lost to date: 6 Total lbs lost since last in-office visit: 6  Interim History: Angel Watkins didn't think the plan was as hard as she was expecting it to be. She is switching dinner and lunch times. Breakfast is not difficult and pt stuck with eggs. She is trying new things. For snacks, she is doing peanut butter crackers and grapefruit. She hasn't been using all 300 calories. Easter holiday, pt is visiting away from home. Pt added diet Dr. Reino Kent occasionally. She denies hunger.  Subjective:   1. Type 2 diabetes mellitus with hyperglycemia, without long-term current use of insulin (HCC) Chade's last A1c 6.5 and insulin level 10.8. Angel Watkins was diagnosed with diabetes many years ago. She is on Metformin 1000 mg BID.  2. Hypertension associated with diabetes (HCC) Angel Watkins is on Avapro. Her BP is well controlled. She denies chest pain.  3. Hyperlipidemia associated with type 2 diabetes mellitus (HCC) Angel Watkins is on Lipitor 20 mg. Her LDL 64, HDL 67, triglycerides 56.  4. Vitamin D deficiency Angel Watkins denies nausea, vomiting, and muscle weakness but notes fatigue. Her Vit D level 46.4.  5. At risk for side effect of medication Angel Watkins is at risk for side effect of medication starting Ozempic.  Assessment/Plan:   1. Type 2 diabetes mellitus with hyperglycemia, without long-term current use of insulin (HCC) [name], neuropathy, limb loss, blindness,  coronary artery disease, and death. Intensive lifestyle modification including diet, exercise and weight loss are the first line of treatment for diabetes. Start Ozempic 0.25 mg, as prescribed below. Decrease Metformin to 1,000 daily.  - Semaglutide,0.25 or 0.5MG /DOS, (OZEMPIC, 0.25 OR 0.5 MG/DOSE,) 2 MG/1.5ML SOPN; Inject 0.5 mg into the skin once a week.  Dispense: 1.5 mL; Refill: 0  2. Hypertension associated with diabetes (HCC) Angel Watkins is working on healthy weight loss and exercise to improve blood pressure control. We will watch for signs of hypotension as she continues her lifestyle modifications. Follow up BP at next appointment with no change in dose.   3. Hyperlipidemia associated with type 2 diabetes mellitus (HCC) Cardiovascular risk and specific lipid/LDL goals reviewed.  We discussed several lifestyle modifications today and Angel Watkins will continue to work on diet, exercise and weight loss efforts. Orders and follow up as documented in patient record. Follow up labs in 3 months.  Counseling Intensive lifestyle modifications are the first line treatment for this issue. . Dietary changes: Increase soluble fiber. Decrease simple carbohydrates. . Exercise changes: Moderate to vigorous-intensity aerobic activity 150 minutes per week if tolerated. . Lipid-lowering medications: see documented in medical record.  4. Vitamin D deficiency Low Vitamin D level contributes to fatigue and are associated with obesity, breast, and colon cancer. She agrees to increase OTC Vitamin D to 2,000 IU daily and will follow-up for routine testing of Vitamin D, at least 2-3 times per year to avoid over-replacement. Repeat labs in 3 months.  5. At risk for side effect of medication Tyson was given approximately 15  minutes of drug side effect counseling today.  We discussed side effect possibility and risk versus benefits. Karessa agreed to the medication and will contact this office if these side effects are  intolerable.  Repetitive spaced learning was employed today to elicit superior memory formation and behavioral change.  6. Class 3 severe obesity with serious comorbidity and body mass index (BMI) of 50.0 to 59.9 in adult, unspecified obesity type [facility]) Angel Watkins is currently in the action stage of change. As such, her goal is to continue with weight loss efforts. She has agreed to the Category 2 Plan + 100.   Exercise goals: No exercise has been prescribed at this time.  Behavioral modification strategies: increasing lean protein intake and meal planning and cooking strategies.  Angel Watkins has agreed to follow-up with our clinic in 2 weeks. She was informed of the importance of frequent follow-up visits to maximize her success with intensive lifestyle modifications for her multiple health conditions.   Objective:   Blood pressure (!) 123/94, pulse 91, temperature 98.4 F (36.9 C), height 5\' 4"  (1.626 m), weight (!) 327 lb (148.3 kg), SpO2 97 %. Body mass index is 56.13 kg/m.  General: Cooperative, alert, well developed, in no acute distress. HEENT: Conjunctivae and lids unremarkable. Cardiovascular: Regular rhythm.  Lungs: Normal work of breathing. Neurologic: No focal deficits.   Lab Results  Component Value Date   CREATININE 1.04 (H) 04/22/2020   BUN 20 04/22/2020   NA 142 04/22/2020   K 4.8 04/22/2020   CL 101 04/22/2020   CO2 24 04/22/2020   Lab Results  Component Value Date   ALT 23 04/22/2020   AST 12 04/22/2020   ALKPHOS 88 04/22/2020   BILITOT 0.5 04/22/2020   Lab Results  Component Value Date   HGBA1C 6.5 (H) 04/22/2020   HGBA1C 6.2 (H) 11/29/2018   Lab Results  Component Value Date   INSULIN 10.8 04/22/2020   Lab Results  Component Value Date   TSH 1.670 04/22/2020   Lab Results  Component Value Date   CHOL 143 04/22/2020   HDL 67 04/22/2020   LDLCALC 64 04/22/2020   TRIG 56 04/22/2020   Lab Results  Component Value Date   WBC 7.4 04/22/2020   HGB  14.6 04/22/2020   HCT 43.1 04/22/2020   MCV 92 04/22/2020   PLT 351 04/22/2020    Attestation Statements:   Reviewed by clinician on day of visit: allergies, medications, problem list, medical history, surgical history, family history, social history, and previous encounter notes.  04/24/2020, am acting as transcriptionist for Edmund Hilda, MD.   I have reviewed the above documentation for accuracy and completeness, and I agree with the above. - Reuben Likes, MD

## 2020-05-13 NOTE — Telephone Encounter (Signed)
Approved for Ozempic

## 2020-05-26 ENCOUNTER — Ambulatory Visit (INDEPENDENT_AMBULATORY_CARE_PROVIDER_SITE_OTHER): Payer: 59 | Admitting: Family Medicine

## 2020-05-26 ENCOUNTER — Encounter (INDEPENDENT_AMBULATORY_CARE_PROVIDER_SITE_OTHER): Payer: Self-pay | Admitting: Family Medicine

## 2020-05-26 ENCOUNTER — Other Ambulatory Visit: Payer: Self-pay

## 2020-05-26 VITALS — BP 129/84 | HR 74 | Temp 98.2°F | Ht 64.0 in | Wt 322.0 lb

## 2020-05-26 DIAGNOSIS — I152 Hypertension secondary to endocrine disorders: Secondary | ICD-10-CM

## 2020-05-26 DIAGNOSIS — E1159 Type 2 diabetes mellitus with other circulatory complications: Secondary | ICD-10-CM | POA: Diagnosis not present

## 2020-05-26 DIAGNOSIS — E1165 Type 2 diabetes mellitus with hyperglycemia: Secondary | ICD-10-CM

## 2020-05-26 DIAGNOSIS — Z6841 Body Mass Index (BMI) 40.0 and over, adult: Secondary | ICD-10-CM

## 2020-05-27 NOTE — Progress Notes (Signed)
Chief Complaint:   OBESITY Angel Watkins is here to discuss her progress with her obesity treatment plan along with follow-up of her obesity related diagnoses. Angel Watkins is on the Category 2 Plan + 100 calories and states she is following her eating plan approximately 90% of the time. Angel Watkins states she is not currently exercising.  Today's visit was #: 3 Starting weight: 333 lbs Starting date: 04/22/2020 Today's weight: 322 lbs Today's date: 05/26/2020 Total lbs lost to date: 11 Total lbs lost since last in-office visit: 5  Interim History: Angel Watkins did have a few indulgences over Easter and felt somewhat defeated with not following meal plan as strictly. She realizes that she has been able to get back on plan and has stayed away from oatmeal cakes. She notes occasional hunger at night, is she skipped dinner.  Subjective:   1. Hypertension associated with diabetes (HCC) Angel Watkins's BP is well controlled. Pt denies chest pain, chest pressure and headache. She is on irbesartan.  2. Type 2 diabetes mellitus with hyperglycemia, without long-term current use of insulin (HCC) Angel Watkins is on Ozempic and denies GI side effects. His A1c is 6.5 and insulin level 10.8.  Assessment/Plan:   1. Hypertension associated with diabetes (HCC) Angel Watkins is working on healthy weight loss and exercise to improve blood pressure control. We will watch for signs of hypotension as she continues her lifestyle modifications. Continue current treatment plan.  2. Type 2 diabetes mellitus with hyperglycemia, without long-term current use of insulin (HCC) [name], neuropathy, limb loss, blindness, coronary artery disease, and death. Intensive lifestyle modification including diet, exercise and weight loss are the first line of treatment for diabetes. Continue current treatment plan.  3. Class 3 severe obesity with serious  comorbidity and body mass index (BMI) of 50.0 to 59.9 in adult, unspecified obesity type [facility]) Angel Watkins is currently in the action stage of change. As such, her goal is to continue with weight loss efforts. She has agreed to the Category 2 Plan + 100 calories.   Exercise goals: No exercise has been prescribed at this time.  Behavioral modification strategies: increasing lean protein intake, meal planning and cooking strategies, keeping healthy foods in the home and avoiding temptations.  Angel Watkins has agreed to follow-up with our clinic in 2-3 weeks. She was informed of the importance of frequent follow-up visits to maximize her success with intensive lifestyle modifications for her multiple health conditions.   Objective:   Blood pressure 129/84, pulse 74, temperature 98.2 F (36.8 C), height 5\' 4"  (1.626 m), weight (!) 322 lb (146.1 kg), SpO2 97 %. Body mass index is 55.27 kg/m.  General: Cooperative, alert, well developed, in no acute distress. HEENT: Conjunctivae and lids unremarkable. Cardiovascular: Regular rhythm.  Lungs: Normal work of breathing. Neurologic: No focal deficits.   Lab Results  Component Value Date   CREATININE 1.04 (H) 04/22/2020   BUN 20 04/22/2020   NA 142 04/22/2020   K 4.8 04/22/2020   CL 101 04/22/2020   CO2 24 04/22/2020   Lab Results  Component Value Date   ALT 23 04/22/2020   AST 12 04/22/2020   ALKPHOS 88 04/22/2020   BILITOT 0.5 04/22/2020   Lab Results  Component Value Date   HGBA1C 6.5 (H) 04/22/2020   HGBA1C 6.2 (H) 11/29/2018   Lab Results  Component Value Date   INSULIN 10.8 04/22/2020   Lab Results  Component Value Date  TSH 1.670 04/22/2020   Lab Results  Component Value Date   CHOL 143 04/22/2020   HDL 67 04/22/2020   LDLCALC 64 04/22/2020   TRIG 56 04/22/2020   Lab Results  Component Value Date   WBC 7.4 04/22/2020   HGB 14.6 04/22/2020   HCT 43.1 04/22/2020   MCV 92 04/22/2020   PLT 351 04/22/2020   Attestation  Statements:   Reviewed by clinician on day of visit: allergies, medications, problem list, medical history, surgical history, family history, social history, and previous encounter notes.  Edmund Hilda, am acting as transcriptionist for Reuben Likes, MD.   I have reviewed the above documentation for accuracy and completeness, and I agree with the above. - Katherina Mires, MD

## 2020-06-12 ENCOUNTER — Encounter (INDEPENDENT_AMBULATORY_CARE_PROVIDER_SITE_OTHER): Payer: Self-pay | Admitting: Family Medicine

## 2020-06-12 ENCOUNTER — Other Ambulatory Visit (INDEPENDENT_AMBULATORY_CARE_PROVIDER_SITE_OTHER): Payer: Self-pay | Admitting: Family Medicine

## 2020-06-12 DIAGNOSIS — E1165 Type 2 diabetes mellitus with hyperglycemia: Secondary | ICD-10-CM

## 2020-06-13 ENCOUNTER — Other Ambulatory Visit (INDEPENDENT_AMBULATORY_CARE_PROVIDER_SITE_OTHER): Payer: Self-pay | Admitting: Family Medicine

## 2020-06-13 DIAGNOSIS — E1165 Type 2 diabetes mellitus with hyperglycemia: Secondary | ICD-10-CM

## 2020-06-15 NOTE — Telephone Encounter (Signed)
Last seen by Lawson Radar

## 2020-06-17 ENCOUNTER — Encounter (INDEPENDENT_AMBULATORY_CARE_PROVIDER_SITE_OTHER): Payer: Self-pay | Admitting: Family Medicine

## 2020-06-17 ENCOUNTER — Ambulatory Visit (INDEPENDENT_AMBULATORY_CARE_PROVIDER_SITE_OTHER): Payer: 59 | Admitting: Family Medicine

## 2020-06-17 ENCOUNTER — Other Ambulatory Visit: Payer: Self-pay

## 2020-06-17 VITALS — BP 110/77 | HR 76 | Temp 97.8°F | Ht 64.0 in | Wt 318.0 lb

## 2020-06-17 DIAGNOSIS — I152 Hypertension secondary to endocrine disorders: Secondary | ICD-10-CM

## 2020-06-17 DIAGNOSIS — E1159 Type 2 diabetes mellitus with other circulatory complications: Secondary | ICD-10-CM

## 2020-06-17 DIAGNOSIS — E1165 Type 2 diabetes mellitus with hyperglycemia: Secondary | ICD-10-CM | POA: Diagnosis not present

## 2020-06-17 DIAGNOSIS — Z6841 Body Mass Index (BMI) 40.0 and over, adult: Secondary | ICD-10-CM

## 2020-06-18 NOTE — Progress Notes (Signed)
Chief Complaint:   OBESITY Angel Watkins is here to discuss her progress with her obesity treatment plan along with follow-up of her obesity related diagnoses. Angel Watkins is on the Category 2 Plan + 100 calories and states she is following her eating plan approximately 85% of the time. Angel Watkins states she is not currently exercising.  Today's visit was #: 4 Starting weight: 333 lbs Starting date: 04/22/2020 Today's weight: 318 lbs Today's date: 06/17/2020 Total lbs lost to date: 15 lbs Total lbs lost since last in-office visit: 4  Interim History: Angel Watkins fell back into not always eating consistently. Angel Watkins is tired of always cooking. She has been very conscious of what she is eating. She needs help with making some other choices. Pt is good with breakfast and lunch. Found a [place] yogurt she likes. She is good on snack ideas. She is going to a relatives house for a cookout.  Subjective:   1. Type 2 diabetes mellitus with hyperglycemia, without long-term current use of insulin (HCC) Angel Watkins's last A1c 6.5. she is doing well on Ozempic 0.5 mg and Metformin 1000 mg BID.  2. Hypertension associated with diabetes (HCC) BP very well controlled. Angel Watkins denies chest pain/chest pressure/headache.  Assessment/Plan:   1. Type 2 diabetes mellitus with hyperglycemia, without long-term current use of insulin (HCC) [name], neuropathy, limb loss, blindness, coronary artery disease, and death. Intensive lifestyle modification including diet, exercise and weight loss are the first line of treatment for diabetes. Continue Ozempic with no change in dosage.  2. Hypertension associated with diabetes (HCC) Angel Watkins is working on healthy weight loss and exercise to improve blood pressure control. We will watch for signs of hypotension as she continues her lifestyle modifications. Continue current medications with no  change I dosage yet. Change dose at next appt if BP is same as today.  3. Class 3 severe obesity with serious comorbidity and body mass index (BMI) of 50.0 to 59.9 in adult, unspecified obesity type [facility]) Angel Watkins is currently in the action stage of change. As such, her goal is to continue with weight loss efforts. She has agreed to the Category 2 Plan + 100 calories and keeping a food journal and adhering to recommended goals of [id number] calories and 35+ g protein with supper.   Exercise goals: No exercise has been prescribed at this time.  Behavioral modification strategies: increasing lean protein intake, meal planning and cooking strategies, keeping healthy foods in the home and planning for success.  Angel Watkins has agreed to follow-up with our clinic in 2-3 weeks. She was informed of the importance of frequent follow-up visits to maximize her success with intensive lifestyle modifications for her multiple health conditions.   Objective:   Blood pressure 110/77, pulse 76, temperature 97.8 F (36.6 C), height 5\' 4"  (1.626 m), weight (!) 318 lb (144.2 kg), SpO2 99 %. Body mass index is 54.58 kg/m.  General: Cooperative, alert, well developed, in no acute distress. HEENT: Conjunctivae and lids unremarkable. Cardiovascular: Regular rhythm.  Lungs: Normal work of breathing. Neurologic: No focal deficits.   Lab Results  Component Value Date   CREATININE 1.04 (H) 04/22/2020   BUN 20 04/22/2020   NA 142 04/22/2020   K 4.8 04/22/2020   CL 101 04/22/2020   CO2 24 04/22/2020   Lab Results  Component Value Date   ALT 23 04/22/2020   AST 12 04/22/2020   ALKPHOS 88 04/22/2020  BILITOT 0.5 04/22/2020   Lab Results  Component Value Date   HGBA1C 6.5 (H) 04/22/2020   HGBA1C 6.2 (H) 11/29/2018   Lab Results  Component Value Date   INSULIN 10.8 04/22/2020   Lab Results  Component Value Date   TSH 1.670 04/22/2020   Lab Results  Component Value Date   CHOL 143 04/22/2020   HDL 67  04/22/2020   LDLCALC 64 04/22/2020   TRIG 56 04/22/2020   Lab Results  Component Value Date   WBC 7.4 04/22/2020   HGB 14.6 04/22/2020   HCT 43.1 04/22/2020   MCV 92 04/22/2020   PLT 351 04/22/2020   No results found for: IRON, TIBC, FERRITIN  Attestation Statements:   Reviewed by clinician on day of visit: allergies, medications, problem list, medical history, surgical history, family history, social history, and previous encounter notes.  Edmund Hilda, CMA, am acting as transcriptionist for Reuben Likes, MD.   I have reviewed the above documentation for accuracy and completeness, and I agree with the above. - Katherina Mires, MD

## 2020-07-14 ENCOUNTER — Other Ambulatory Visit (INDEPENDENT_AMBULATORY_CARE_PROVIDER_SITE_OTHER): Payer: Self-pay | Admitting: Family Medicine

## 2020-07-14 ENCOUNTER — Ambulatory Visit (INDEPENDENT_AMBULATORY_CARE_PROVIDER_SITE_OTHER): Payer: 59 | Admitting: Family Medicine

## 2020-07-14 DIAGNOSIS — E1165 Type 2 diabetes mellitus with hyperglycemia: Secondary | ICD-10-CM

## 2020-07-15 ENCOUNTER — Other Ambulatory Visit: Payer: Self-pay

## 2020-07-15 ENCOUNTER — Encounter (INDEPENDENT_AMBULATORY_CARE_PROVIDER_SITE_OTHER): Payer: Self-pay | Admitting: Adult Health

## 2020-07-15 ENCOUNTER — Ambulatory Visit (INDEPENDENT_AMBULATORY_CARE_PROVIDER_SITE_OTHER): Payer: 59 | Admitting: Adult Health

## 2020-07-15 VITALS — BP 118/78 | HR 64 | Temp 98.1°F | Ht 64.0 in | Wt 318.0 lb

## 2020-07-15 DIAGNOSIS — I152 Hypertension secondary to endocrine disorders: Secondary | ICD-10-CM

## 2020-07-15 DIAGNOSIS — Z6841 Body Mass Index (BMI) 40.0 and over, adult: Secondary | ICD-10-CM

## 2020-07-15 DIAGNOSIS — E1159 Type 2 diabetes mellitus with other circulatory complications: Secondary | ICD-10-CM | POA: Diagnosis not present

## 2020-07-15 DIAGNOSIS — E1165 Type 2 diabetes mellitus with hyperglycemia: Secondary | ICD-10-CM | POA: Diagnosis not present

## 2020-07-15 DIAGNOSIS — Z9189 Other specified personal risk factors, not elsewhere classified: Secondary | ICD-10-CM | POA: Diagnosis not present

## 2020-07-15 MED ORDER — OZEMPIC (0.25 OR 0.5 MG/DOSE) 2 MG/1.5ML ~~LOC~~ SOPN: PEN_INJECTOR | SUBCUTANEOUS | 0 refills | Status: DC

## 2020-07-20 DIAGNOSIS — E1165 Type 2 diabetes mellitus with hyperglycemia: Secondary | ICD-10-CM | POA: Insufficient documentation

## 2020-07-20 DIAGNOSIS — E1159 Type 2 diabetes mellitus with other circulatory complications: Secondary | ICD-10-CM | POA: Insufficient documentation

## 2020-07-20 DIAGNOSIS — Z6841 Body Mass Index (BMI) 40.0 and over, adult: Secondary | ICD-10-CM | POA: Insufficient documentation

## 2020-07-20 NOTE — Progress Notes (Signed)
Chief Complaint:   OBESITY Angel Watkins is here to discuss her progress with her obesity treatment plan along with follow-up of her obesity related diagnoses. Angel Watkins is on the Category 2 Plan + 100 calories and 35 g protein at supper and states she is following her eating plan approximately 85% of the time. Angel Watkins states she is not currently exercising.  Today's visit was #: 5 Starting weight: 333 lbs Starting date: 04/22/2020 Today's weight: 318 lbs Today's date: 07/15/2020 Total lbs lost to date: 15 Total lbs lost since last in-office visit: 0  Interim History: Angel Watkins has not been consistently consuming dinner due to fatigue. She has been choosing low calorie ice cream snacks 100-140 cal per snack- will often have several per day.  Subjective:   1. Type 2 diabetes mellitus with hyperglycemia, without long-term current use of insulin (HCC) Angel Watkins is on Metformin 1000 mg BID.  She is on Ozempic 0.5 mg once a week.  04/22/2020 BG  was elevated at 103. A1c 6.5- at goal and insulin level 10.8- elevated.  Pt denies mass in neck, dyspepsia, GI upset, or persistent hoarseness.    Lab Results  Component Value Date   HGBA1C 6.5 (H) 04/22/2020   HGBA1C 6.2 (H) 11/29/2018   Lab Results  Component Value Date   LDLCALC 64 04/22/2020   CREATININE 1.04 (H) 04/22/2020   Lab Results  Component Value Date   INSULIN 10.8 04/22/2020   2. Hypertension associated with diabetes (HCC) BP/HR excellent at OV.  Angel Watkins is on Irbesartan 75 mg QD and torsemide 20 mg QD.  BP Readings from Last 3 Encounters:  07/15/20 118/78  06/17/20 110/77  05/26/20 129/84   3. At risk for nausea Angel Watkins is at risk for nausea due to GLP-1 for T2D.   Assessment/Plan:   1. Type 2 diabetes mellitus with hyperglycemia, without long-term current use of insulin (HCC) [name], neuropathy, limb loss, blindness,  coronary artery disease, and death. Intensive lifestyle modification including diet, exercise and weight loss are the first line of treatment for diabetes.   - Semaglutide,0.25 or 0.5MG /DOS, (OZEMPIC, 0.25 OR 0.5 MG/DOSE,) 2 MG/1.5ML SOPN; INJECT 0.5MG  INTO THE SKIN ONCE A WEEK  Dispense: 1.5 mL; Refill: 0  2. Hypertension associated with diabetes (HCC) Danyka is working on healthy weight loss and exercise to improve blood pressure control. We will watch for signs of hypotension as she continues her lifestyle modifications. -Continue category 2 and Irbesartan and torsemide.  3. At risk for nausea Angel Watkins was given approximately 15 minutes of nausea prevention counseling today. Angel Watkins is at risk for nausea due to her new or current medication. She was encouraged to titrate her medication slowly, make sure to stay hydrated, eat smaller portions throughout the day, and avoid high fat meals.    4. Class 3 severe obesity with serious comorbidity and body mass index (BMI) of 50.0 to 59.9 in adult, unspecified obesity type Angel Watkins)  Angel Watkins is currently in the action stage of change. As such, her goal is to continue with weight loss efforts. She has agreed to the Category 2 Plan + 100 calories.   Keep total snack calories per day <300 calories. -Check fasting labs at next OV.  Exercise goals: No exercise has been prescribed at this time.  Behavioral modification strategies: increasing lean protein intake, decreasing simple carbohydrates, meal planning and cooking strategies, keeping healthy foods in the home, and  planning for success.  Angel Watkins has agreed to follow-up with our clinic in 2 weeks. She was informed of the importance of frequent follow-up visits to maximize her success with intensive lifestyle modifications for her multiple health conditions.   Objective:   Blood pressure 118/78, pulse 64, temperature 98.1 F (36.7 C), height 5\' 4"  (1.626 m), weight (!) 318 lb (144.2 kg), SpO2  96 %. Body mass index is 54.58 kg/m.  General: Cooperative, alert, well developed, in no acute distress. HEENT: Conjunctivae and lids unremarkable. Cardiovascular: Regular rhythm.  Lungs: Normal work of breathing. Neurologic: No focal deficits.   Lab Results  Component Value Date   CREATININE 1.04 (H) 04/22/2020   BUN 20 04/22/2020   NA 142 04/22/2020   K 4.8 04/22/2020   CL 101 04/22/2020   CO2 24 04/22/2020   Lab Results  Component Value Date   ALT 23 04/22/2020   AST 12 04/22/2020   ALKPHOS 88 04/22/2020   BILITOT 0.5 04/22/2020   Lab Results  Component Value Date   HGBA1C 6.5 (H) 04/22/2020   HGBA1C 6.2 (H) 11/29/2018   Lab Results  Component Value Date   INSULIN 10.8 04/22/2020   Lab Results  Component Value Date   TSH 1.670 04/22/2020   Lab Results  Component Value Date   CHOL 143 04/22/2020   HDL 67 04/22/2020   LDLCALC 64 04/22/2020   TRIG 56 04/22/2020   Lab Results  Component Value Date   WBC 7.4 04/22/2020   HGB 14.6 04/22/2020   HCT 43.1 04/22/2020   MCV 92 04/22/2020   PLT 351 04/22/2020   No results found for: IRON, TIBC, FERRITIN   Attestation Statements:   Reviewed by clinician on day of visit: allergies, medications, problem list, medical history, surgical history, family history, social history, and previous encounter notes.  04/24/2020, CMA, am acting as transcriptionist for Edmund Hilda, NP.  I have reviewed the above documentation for accuracy and completeness, and I agree with the above. -  Shevette Bess d. Marta Bouie, NP-C

## 2020-07-30 ENCOUNTER — Ambulatory Visit (INDEPENDENT_AMBULATORY_CARE_PROVIDER_SITE_OTHER): Payer: 59 | Admitting: Family Medicine

## 2020-07-30 ENCOUNTER — Other Ambulatory Visit: Payer: Self-pay

## 2020-07-30 ENCOUNTER — Encounter (INDEPENDENT_AMBULATORY_CARE_PROVIDER_SITE_OTHER): Payer: Self-pay | Admitting: Family Medicine

## 2020-07-30 VITALS — BP 110/75 | HR 65 | Temp 98.5°F | Ht 64.0 in | Wt 310.0 lb

## 2020-07-30 DIAGNOSIS — E1165 Type 2 diabetes mellitus with hyperglycemia: Secondary | ICD-10-CM

## 2020-07-30 DIAGNOSIS — E559 Vitamin D deficiency, unspecified: Secondary | ICD-10-CM | POA: Diagnosis not present

## 2020-07-30 DIAGNOSIS — Z9189 Other specified personal risk factors, not elsewhere classified: Secondary | ICD-10-CM | POA: Diagnosis not present

## 2020-07-30 DIAGNOSIS — E1169 Type 2 diabetes mellitus with other specified complication: Secondary | ICD-10-CM | POA: Diagnosis not present

## 2020-07-30 DIAGNOSIS — E785 Hyperlipidemia, unspecified: Secondary | ICD-10-CM

## 2020-07-30 DIAGNOSIS — Z6841 Body Mass Index (BMI) 40.0 and over, adult: Secondary | ICD-10-CM

## 2020-07-30 MED ORDER — OZEMPIC (0.25 OR 0.5 MG/DOSE) 2 MG/1.5ML ~~LOC~~ SOPN: PEN_INJECTOR | SUBCUTANEOUS | 0 refills | Status: DC

## 2020-07-31 LAB — LIPID PANEL WITH LDL/HDL RATIO
Cholesterol, Total: 119 mg/dL (ref 100–199)
HDL: 63 mg/dL (ref >39)
LDL Chol Calc (NIH): 43 mg/dL (ref 0–99)
LDL/HDL Ratio: 0.7 ratio (ref 0.0–3.2)
Triglycerides: 62 mg/dL (ref 0–149)
VLDL Cholesterol Cal: 13 mg/dL (ref 5–40)

## 2020-07-31 LAB — COMPREHENSIVE METABOLIC PANEL
ALT: 17 IU/L (ref 0–32)
AST: 14 IU/L (ref 0–40)
Albumin/Globulin Ratio: 2.1 (ref 1.2–2.2)
Albumin: 4.4 g/dL (ref 3.8–4.9)
Alkaline Phosphatase: 73 IU/L (ref 44–121)
BUN/Creatinine Ratio: 19 (ref 9–23)
BUN: 17 mg/dL (ref 6–24)
Bilirubin Total: 0.5 mg/dL (ref 0.0–1.2)
CO2: 27 mmol/L (ref 20–29)
Calcium: 9.6 mg/dL (ref 8.7–10.2)
Chloride: 101 mmol/L (ref 96–106)
Creatinine, Ser: 0.9 mg/dL (ref 0.57–1.00)
Globulin, Total: 2.1 g/dL (ref 1.5–4.5)
Glucose: 82 mg/dL (ref 65–99)
Potassium: 4.1 mmol/L (ref 3.5–5.2)
Sodium: 142 mmol/L (ref 134–144)
Total Protein: 6.5 g/dL (ref 6.0–8.5)
eGFR: 75 mL/min/{1.73_m2} (ref >59)

## 2020-07-31 LAB — VITAMIN D 25 HYDROXY (VIT D DEFICIENCY, FRACTURES): Vit D, 25-Hydroxy: 77.9 ng/mL (ref 30.0–100.0)

## 2020-07-31 LAB — INSULIN, RANDOM: INSULIN: 6.6 u[IU]/mL (ref 2.6–24.9)

## 2020-07-31 LAB — HEMOGLOBIN A1C
Est. average glucose Bld gHb Est-mCnc: 108 mg/dL
Hgb A1c MFr Bld: 5.4 % (ref 4.8–5.6)

## 2020-08-05 NOTE — Progress Notes (Signed)
Chief Complaint:   OBESITY Angel Watkins is here to discuss her progress with her obesity treatment plan along with follow-up of her obesity related diagnoses. Angel Watkins is on the Category 2 Plan + 100 cal and states she is following her eating plan approximately 85% of the time. Angel Watkins states she is not currently exercising.  Today's visit was #: 6 Starting weight: 333 lbs Starting date: 04/22/2020 Today's weight: 310 lbs Today's date: 07/30/2020 Total lbs lost to date: 23 Total lbs lost since last in-office visit: 8  Interim History: Angel Watkins realizes some things she habitually was doing that were sabotages. She is eating protein even when she felt she was going to skip dinner. She tried out [employer] and enjoyed them. For the 4th of July, her cousin is having a cookout.  Subjective:   1. Type 2 diabetes mellitus with hyperglycemia, without long-term current use of insulin (HCC) Angel Watkins is on Ozempic 0.5 mg weekly and Metformin. She denies GI side effects.  2. Hyperlipidemia associated with type 2 diabetes mellitus (HCC) Angel Watkins's last LDL was 64. She is on Lipitor. Pt denies myalgias or transaminitis.  3. Vitamin D deficiency Angel Watkins is on OTC Vit D 1000 IU daily. She reports fatigue.  4. At risk for heart disease Angel Watkins is at a higher than average risk for cardiovascular disease due to obesity.   Assessment/Plan:   1. Type 2 diabetes mellitus with hyperglycemia, without long-term current use of insulin (HCC) [name], neuropathy, limb loss, blindness, coronary artery disease, and death. Intensive lifestyle modification including diet, exercise and weight loss are the first line of treatment for diabetes.  Check labs today.  - Hemoglobin A1c - Insulin, random  Refill- Semaglutide,0.25 or 0.5MG /DOS, (OZEMPIC, 0.25 OR 0.5 MG/DOSE,) 2 MG/1.5ML SOPN; INJECT 0.5MG  INTO THE SKIN  ONCE A WEEK  Dispense: 1.5 mL; Refill: 0  2. Hyperlipidemia associated with type 2 diabetes mellitus (HCC) Cardiovascular risk and specific lipid/LDL goals reviewed.  We discussed several lifestyle modifications today and Angel Watkins will continue to work on diet, exercise and weight loss efforts. Orders and follow up as documented in patient record.   Counseling Intensive lifestyle modifications are the first line treatment for this issue. Dietary changes: Increase soluble fiber. Decrease simple carbohydrates. Exercise changes: Moderate to vigorous-intensity aerobic activity 150 minutes per week if tolerated. Lipid-lowering medications: see documented in medical record. Check labs today.  - Lipid Panel With LDL/HDL Ratio - Comprehensive metabolic panel  3. Vitamin D deficiency Low Vitamin D level contributes to fatigue and are associated with obesity, breast, and colon cancer. She agrees to continue to take OTC  Vitamin D @1 ,000 IU daily and will follow-up for routine testing of Vitamin D, at least 2-3 times per year to avoid over-replacement. Check labs today.  - VITAMIN D 25 Hydroxy (Vit-D Deficiency, Fractures)  4. At risk for heart disease Angel Watkins was given approximately 15 minutes of coronary artery disease prevention counseling today. She is 60 y.o. female and has risk factors for heart disease including obesity. We discussed intensive lifestyle modifications today with an emphasis on specific weight loss instructions and strategies.   Repetitive spaced learning was employed today to elicit superior memory formation and behavioral change.   5. Class 3 severe obesity with serious comorbidity and body mass index (BMI) of 50.0 to 59.9 in adult, unspecified obesity type [facility])  Angel Watkins is currently in the action stage of  change. As such, her goal is to continue with weight loss efforts. She has agreed to the Category 2 Plan + 100 calories.   Exercise goals: All adults should avoid  inactivity. Some physical activity is better than none, and adults who participate in any amount of physical activity gain some health benefits. Pt wants to start water aerobics.  Behavioral modification strategies: increasing lean protein intake, meal planning and cooking strategies, keeping healthy foods in the home, and planning for success.  Angel Watkins has agreed to follow-up with our clinic in 2 weeks. She was informed of the importance of frequent follow-up visits to maximize her success with intensive lifestyle modifications for her multiple health conditions.   Angel Watkins was informed we would discuss her lab results at her next visit unless there is a critical issue that needs to be addressed sooner. Angel Watkins agreed to keep her next visit at the agreed upon time to discuss these results.  Objective:   Blood pressure 110/75, pulse 65, temperature 98.5 F (36.9 C), height 5\' 4"  (1.626 m), weight (!) 310 lb (140.6 kg), SpO2 99 %. Body mass index is 53.21 kg/m.  General: Cooperative, alert, well developed, in no acute distress. HEENT: Conjunctivae and lids unremarkable. Cardiovascular: Regular rhythm.  Lungs: Normal work of breathing. Neurologic: No focal deficits.   Lab Results  Component Value Date   CREATININE 0.90 07/30/2020   BUN 17 07/30/2020   NA 142 07/30/2020   K 4.1 07/30/2020   CL 101 07/30/2020   CO2 27 07/30/2020   Lab Results  Component Value Date   ALT 17 07/30/2020   AST 14 07/30/2020   ALKPHOS 73 07/30/2020   BILITOT 0.5 07/30/2020   Lab Results  Component Value Date   HGBA1C 5.4 07/30/2020   HGBA1C 6.5 (H) 04/22/2020   HGBA1C 6.2 (H) 11/29/2018   Lab Results  Component Value Date   INSULIN 6.6 07/30/2020   INSULIN 10.8 04/22/2020   Lab Results  Component Value Date   TSH 1.670 04/22/2020   Lab Results  Component Value Date   CHOL 119 07/30/2020   HDL 63 07/30/2020   LDLCALC 43 07/30/2020   TRIG 62 07/30/2020   Lab Results  Component Value Date    VD25OH 77.9 07/30/2020   VD25OH 46.4 04/22/2020   Lab Results  Component Value Date   WBC 7.4 04/22/2020   HGB 14.6 04/22/2020   HCT 43.1 04/22/2020   MCV 92 04/22/2020   PLT 351 04/22/2020   No results found for: IRON, TIBC, FERRITIN  Attestation Statements:   Reviewed by clinician on day of visit: allergies, medications, problem list, medical history, surgical history, family history, social history, and previous encounter notes.  04/24/2020, CMA, am acting as transcriptionist for Edmund Hilda, MD.  I have reviewed the above documentation for accuracy and completeness, and I agree with the above. - Reuben Likes, MD

## 2020-08-13 ENCOUNTER — Other Ambulatory Visit: Payer: Self-pay

## 2020-08-13 ENCOUNTER — Encounter (INDEPENDENT_AMBULATORY_CARE_PROVIDER_SITE_OTHER): Payer: Self-pay | Admitting: Physician Assistant

## 2020-08-13 ENCOUNTER — Ambulatory Visit (INDEPENDENT_AMBULATORY_CARE_PROVIDER_SITE_OTHER): Payer: 59 | Admitting: Physician Assistant

## 2020-08-13 VITALS — BP 110/74 | HR 75 | Temp 98.3°F | Ht 64.0 in | Wt 314.0 lb

## 2020-08-13 DIAGNOSIS — E559 Vitamin D deficiency, unspecified: Secondary | ICD-10-CM

## 2020-08-13 DIAGNOSIS — E66813 Obesity, class 3: Secondary | ICD-10-CM

## 2020-08-13 DIAGNOSIS — Z9189 Other specified personal risk factors, not elsewhere classified: Secondary | ICD-10-CM

## 2020-08-13 DIAGNOSIS — Z6841 Body Mass Index (BMI) 40.0 and over, adult: Secondary | ICD-10-CM

## 2020-08-13 DIAGNOSIS — E1165 Type 2 diabetes mellitus with hyperglycemia: Secondary | ICD-10-CM | POA: Diagnosis not present

## 2020-08-13 MED ORDER — VITAMIN D 25 MCG (1000 UNIT) PO TABS
1000.0000 [IU] | ORAL_TABLET | ORAL | 0 refills | Status: DC
Start: 2020-08-13 — End: 2021-01-18

## 2020-08-24 NOTE — Progress Notes (Signed)
Chief Complaint:   OBESITY Angel Watkins is here to discuss her progress with her obesity treatment plan along with follow-up of her obesity related diagnoses. Angel Watkins is on the Category 2 Plan + 100 calories and states she is following her eating plan approximately 50% of the time. Angel Watkins states she is doing 0 minutes 0 times per week.  Today's visit was #: 7 Starting weight: 333 lbs Starting date: 04/22/2020 Today's weight: 314 lbs Today's date: 08/13/2020 Total lbs lost to date: 19 Total lbs lost since last in-office visit: 0  Interim History: Angel Watkins reports that her schedule was off last week, and she ate out for breakfast every morning. She started drinking sweet tea. She is now back on the plan. She struggles most with eating enough protein at dinner.  Subjective:   1. Type 2 diabetes mellitus with hyperglycemia, without long-term current use of insulin (HCC) Angel Watkins is on Ozempic and metformin. Her last A1c was 5.4, which is much improved. I discussed labs with the patient today.  2. Vitamin D deficiency Angel Watkins's last level was at goal. She is on Vit D 1,000 units daily. I discussed labs with the patient today.  3. At risk for heart disease Angel Watkins is at a higher than average risk for cardiovascular disease due to obesity.   Assessment/Plan:   1. Type 2 diabetes mellitus with hyperglycemia, without long-term current use of insulin (HCC) Bryella agreed to discontinue metformin and will continue Ozempic. [name], neuropathy, limb loss, blindness, coronary artery disease, and death. Intensive lifestyle modification including diet, exercise and weight loss are the first line of treatment for diabetes.   2. Vitamin D deficiency Low Vitamin D level contributes to fatigue and are associated with obesity, breast, and colon cancer. Angel Watkins agreed to change Vitamin D to 1,000 units every  other day and will follow-up for routine testing of Vitamin D, at least 2-3 times per year to avoid over-replacement.  - cholecalciferol (VITAMIN D3) 25 MCG (1000 UNIT) tablet; Take 1 tablet (1,000 Units total) by mouth every other day.  Dispense: 30 tablet; Refill: 0  3. At risk for heart disease Angel Watkins was given approximately 15 minutes of coronary artery disease prevention counseling today. She is 60 y.o. female and has risk factors for heart disease including obesity. We discussed intensive lifestyle modifications today with an emphasis on specific weight loss instructions and strategies.   Repetitive spaced learning was employed today to elicit superior memory formation and behavioral change.  4. Class 3 severe obesity with serious comorbidity and body mass index (BMI) of 50.0 to 59.9 in adult, unspecified obesity type (HCC), current bmi 53.87 Angel Watkins is currently in the action stage of change. As such, her goal is to continue with weight loss efforts. She has agreed to the Category 2 Plan.   Strategies to substitute protein was given.  Exercise goals: No exercise has been prescribed at this time.  Behavioral modification strategies: meal planning and cooking strategies and keeping healthy foods in the home.  Angel Watkins has agreed to follow-up with our clinic in 3 weeks. She was informed of the importance of frequent follow-up visits to maximize her success with intensive lifestyle modifications for her multiple health conditions.   Objective:   Blood pressure 110/74, pulse 75, temperature 98.3 F (36.8 C), height 5\' 4"  (1.626 m), weight (!) 314 lb (142.4 kg), SpO2 98 %. Body mass index is 53.9 kg/m.  General: Cooperative, alert, well developed, in no acute distress. HEENT: Conjunctivae and lids unremarkable. Cardiovascular: Regular rhythm.  Lungs: Normal work of breathing. Neurologic: No focal deficits.   Lab Results  Component Value Date   CREATININE 0.90 07/30/2020   BUN 17  07/30/2020   NA 142 07/30/2020   K 4.1 07/30/2020   CL 101 07/30/2020   CO2 27 07/30/2020   Lab Results  Component Value Date   ALT 17 07/30/2020   AST 14 07/30/2020   ALKPHOS 73 07/30/2020   BILITOT 0.5 07/30/2020   Lab Results  Component Value Date   HGBA1C 5.4 07/30/2020   HGBA1C 6.5 (H) 04/22/2020   HGBA1C 6.2 (H) 11/29/2018   Lab Results  Component Value Date   INSULIN 6.6 07/30/2020   INSULIN 10.8 04/22/2020   Lab Results  Component Value Date   TSH 1.670 04/22/2020   Lab Results  Component Value Date   CHOL 119 07/30/2020   HDL 63 07/30/2020   LDLCALC 43 07/30/2020   TRIG 62 07/30/2020   Lab Results  Component Value Date   VD25OH 77.9 07/30/2020   VD25OH 46.4 04/22/2020   Lab Results  Component Value Date   WBC 7.4 04/22/2020   HGB 14.6 04/22/2020   HCT 43.1 04/22/2020   MCV 92 04/22/2020   PLT 351 04/22/2020   No results found for: IRON, TIBC, FERRITIN  Attestation Statements:   Reviewed by clinician on day of visit: allergies, medications, problem list, medical history, surgical history, family history, social history, and previous encounter notes.   Trude Mcburney, am acting as transcriptionist for [employer], PA-C.  I have reviewed the above documentation for accuracy and completeness, and I agree with the above. Alois Cliche, PA-C

## 2020-09-09 ENCOUNTER — Ambulatory Visit (INDEPENDENT_AMBULATORY_CARE_PROVIDER_SITE_OTHER): Payer: 59 | Admitting: Family Medicine

## 2020-09-09 ENCOUNTER — Encounter (INDEPENDENT_AMBULATORY_CARE_PROVIDER_SITE_OTHER): Payer: Self-pay | Admitting: Family Medicine

## 2020-09-09 ENCOUNTER — Other Ambulatory Visit: Payer: Self-pay

## 2020-09-09 VITALS — BP 116/60 | HR 81 | Temp 98.0°F | Ht 64.0 in | Wt 315.0 lb

## 2020-09-09 DIAGNOSIS — E1165 Type 2 diabetes mellitus with hyperglycemia: Secondary | ICD-10-CM | POA: Diagnosis not present

## 2020-09-09 DIAGNOSIS — Z6841 Body Mass Index (BMI) 40.0 and over, adult: Secondary | ICD-10-CM | POA: Diagnosis not present

## 2020-09-09 DIAGNOSIS — I10 Essential (primary) hypertension: Secondary | ICD-10-CM | POA: Diagnosis not present

## 2020-09-09 DIAGNOSIS — Z9189 Other specified personal risk factors, not elsewhere classified: Secondary | ICD-10-CM

## 2020-09-09 MED ORDER — OZEMPIC (0.25 OR 0.5 MG/DOSE) 2 MG/1.5ML ~~LOC~~ SOPN: PEN_INJECTOR | SUBCUTANEOUS | 0 refills | Status: DC

## 2020-09-09 NOTE — Progress Notes (Signed)
Chief Complaint:   OBESITY Angel Watkins is here to discuss her progress with her obesity treatment plan along with follow-up of her obesity related diagnoses. Angel Watkins is on the Category 2 Plan and states she is following her eating plan approximately 60% of the time. Angel Watkins states she is not currently exercising.  Today's visit was #: 8 Starting weight: 333 lbs Starting date: 04/22/2020 Today's weight: 315 lbs Today's date: 09/09/2020 Total lbs lost to date: 18 Total lbs lost since last in-office visit: 0  Interim History: Angel Watkins voices that last week was difficult to follow plan particularly at lunch, as she chose to eat out and believes she ate too much at lunch and then ate her normal dinner. She has no schedule disruptions anticipated in the net 2-3 weeks. She is going on vacation mid September with family.  Subjective:   1. Essential hypertension Angel Watkins's BP is very well controlled today. Pt denies chest pain/chest pressure/headache.  2. Type 2 diabetes mellitus with hyperglycemia, without long-term current use of insulin (HCC) She is on Ozempic and doing well. She is still able to get all food in.  3. At risk for side effect of medication Angel Watkins is at risk for side effects of medication due to taking Ozempic.  Assessment/Plan:   1. Essential hypertension Angel Watkins is working on healthy weight loss and exercise to improve blood pressure control. We will watch for signs of hypotension as she continues her lifestyle modifications. Continue irbesartan.  2. Type 2 diabetes mellitus with hyperglycemia, without long-term current use of insulin (HCC) [name], neuropathy, limb loss, blindness, coronary artery disease, and death. Intensive lifestyle modification including diet, exercise and weight loss are the first line of treatment for diabetes.   Refill- Semaglutide,0.25 or 0.5MG /DOS,  (OZEMPIC, 0.25 OR 0.5 MG/DOSE,) 2 MG/1.5ML SOPN; INJECT 0.5MG  INTO THE SKIN ONCE A WEEK  Dispense: 1.5 mL; Refill: 0  3. At risk for side effect of medication Angel Watkins was given approximately 15 minutes of drug side effect counseling today.  We discussed side effect possibility and risk versus benefits. Angel Watkins agreed to the medication and will contact this office if these side effects are intolerable.  Repetitive spaced learning was employed today to elicit superior memory formation and behavioral change.   4. Obesity with current BMI of 54.1  Angel Watkins is currently in the action stage of change. As such, her goal is to continue with weight loss efforts. She has agreed to the Category 2 Plan.   Exercise goals: All adults should avoid inactivity. Some physical activity is better than none, and adults who participate in any amount of physical activity gain some health benefits.  Behavioral modification strategies: increasing lean protein intake, meal planning and cooking strategies, keeping healthy foods in the home, and planning for success.  Angel Watkins has agreed to follow-up with our clinic in 2-3 weeks. She was informed of the importance of frequent follow-up visits to maximize her success with intensive lifestyle modifications for her multiple health conditions.   Objective:   Blood pressure 116/60, pulse 81, temperature 98 F (36.7 C), height 5\' 4"  (1.626 m), weight (!) 315 lb (142.9 kg), SpO2 100 %. Body mass index is 54.07 kg/m.  General: Cooperative, alert, well developed, in no acute distress. HEENT: Conjunctivae and lids unremarkable. Cardiovascular: Regular rhythm.  Lungs: Normal work of breathing. Neurologic: No focal deficits.   Lab Results  Component Value Date  CREATININE 0.90 07/30/2020   BUN 17 07/30/2020   NA 142 07/30/2020   K 4.1 07/30/2020   CL 101 07/30/2020   CO2 27 07/30/2020   Lab Results  Component Value Date   ALT 17 07/30/2020   AST 14 07/30/2020    ALKPHOS 73 07/30/2020   BILITOT 0.5 07/30/2020   Lab Results  Component Value Date   HGBA1C 5.4 07/30/2020   HGBA1C 6.5 (H) 04/22/2020   HGBA1C 6.2 (H) 11/29/2018   Lab Results  Component Value Date   INSULIN 6.6 07/30/2020   INSULIN 10.8 04/22/2020   Lab Results  Component Value Date   TSH 1.670 04/22/2020   Lab Results  Component Value Date   CHOL 119 07/30/2020   HDL 63 07/30/2020   LDLCALC 43 07/30/2020   TRIG 62 07/30/2020   Lab Results  Component Value Date   VD25OH 77.9 07/30/2020   VD25OH 46.4 04/22/2020   Lab Results  Component Value Date   WBC 7.4 04/22/2020   HGB 14.6 04/22/2020   HCT 43.1 04/22/2020   MCV 92 04/22/2020   PLT 351 04/22/2020    Attestation Statements:   Reviewed by clinician on day of visit: allergies, medications, problem list, medical history, surgical history, family history, social history, and previous encounter notes.  Edmund Hilda, CMA, am acting as transcriptionist for Reuben Likes, MD.   I have reviewed the above documentation for accuracy and completeness, and I agree with the above. - Reuben Likes, MD

## 2020-09-30 ENCOUNTER — Encounter (INDEPENDENT_AMBULATORY_CARE_PROVIDER_SITE_OTHER): Payer: Self-pay | Admitting: Family Medicine

## 2020-09-30 ENCOUNTER — Ambulatory Visit (INDEPENDENT_AMBULATORY_CARE_PROVIDER_SITE_OTHER): Payer: 59 | Admitting: Family Medicine

## 2020-09-30 ENCOUNTER — Other Ambulatory Visit: Payer: Self-pay

## 2020-09-30 VITALS — BP 98/74 | HR 82 | Temp 98.1°F | Ht 64.0 in | Wt 315.0 lb

## 2020-09-30 DIAGNOSIS — Z6841 Body Mass Index (BMI) 40.0 and over, adult: Secondary | ICD-10-CM | POA: Diagnosis not present

## 2020-09-30 DIAGNOSIS — Z9189 Other specified personal risk factors, not elsewhere classified: Secondary | ICD-10-CM

## 2020-09-30 DIAGNOSIS — E1165 Type 2 diabetes mellitus with hyperglycemia: Secondary | ICD-10-CM | POA: Diagnosis not present

## 2020-09-30 DIAGNOSIS — E559 Vitamin D deficiency, unspecified: Secondary | ICD-10-CM

## 2020-09-30 MED ORDER — OZEMPIC (0.25 OR 0.5 MG/DOSE) 2 MG/1.5ML ~~LOC~~ SOPN: PEN_INJECTOR | SUBCUTANEOUS | 0 refills | Status: DC
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
## 2020-10-15 ENCOUNTER — Ambulatory Visit (INDEPENDENT_AMBULATORY_CARE_PROVIDER_SITE_OTHER): Payer: 59 | Admitting: Bariatrics

## 2020-10-15 ENCOUNTER — Other Ambulatory Visit: Payer: Self-pay

## 2020-10-15 ENCOUNTER — Encounter (INDEPENDENT_AMBULATORY_CARE_PROVIDER_SITE_OTHER): Payer: Self-pay | Admitting: Bariatrics

## 2020-10-15 VITALS — BP 121/80 | HR 82 | Temp 98.1°F | Ht 64.0 in | Wt 317.0 lb

## 2020-10-15 DIAGNOSIS — E1165 Type 2 diabetes mellitus with hyperglycemia: Secondary | ICD-10-CM

## 2020-10-15 DIAGNOSIS — E1159 Type 2 diabetes mellitus with other circulatory complications: Secondary | ICD-10-CM | POA: Diagnosis not present

## 2020-10-15 DIAGNOSIS — Z6841 Body Mass Index (BMI) 40.0 and over, adult: Secondary | ICD-10-CM

## 2020-10-15 DIAGNOSIS — I152 Hypertension secondary to endocrine disorders: Secondary | ICD-10-CM

## 2020-10-15 NOTE — Progress Notes (Signed)
Chief Complaint:   OBESITY Angel Watkins is here to discuss her progress with her obesity treatment plan along with follow-up of her obesity related diagnoses. Angel Watkins is on the Category 2 Plan and states she is following her eating plan approximately 80% of the time. Angel Watkins states she is doing 0 minutes 0 times per week.  Today's visit was #: 10 Starting weight: 333 lbs Starting date: 04/22/2020 Today's weight: 317 lbs Today's date: 10/15/2020 Total lbs lost to date: 16 lbs Total lbs lost since last in-office visit: 0  Interim History: Angel Watkins is up 2 lbs since her last visit. She did make a change (found lean cuisine) and has eaten more.  Subjective:   1. Hypertension associated with diabetes (HCC) Angel Watkins's hypertension is controlled on Avapro.   2. Type 2 diabetes mellitus with hyperglycemia, without long-term current use of insulin (HCC) Angel Watkins is currently taking Ozempic.  Assessment/Plan:   1. Hypertension associated with diabetes [facility]) Angel Watkins well continue medications. She is working on healthy weight loss and exercise to improve blood pressure control. We will watch for signs of hypotension as she continues her lifestyle modifications.  2. Type 2 diabetes mellitus with hyperglycemia, without long-term current use of insulin (HCC) Angel Watkins will continue medications.[name], neuropathy, limb loss, blindness, coronary artery disease, and death. Intensive lifestyle modification including diet, exercise and weight loss are the first line of treatment for diabetes.    3. Obesity with current BMI of 54.4 Angel Watkins is currently in the action stage of change. As such, her goal is to continue with weight loss efforts. She has agreed to the Category 2 Plan.   Angel Watkins will continue meal planning. She will adhere closely to her plan, minimizing extra and carbohydrates. She will keep her water  intake high.  Exercise goals: No exercise has been prescribed at this time.  Behavioral modification strategies: increasing lean protein intake, decreasing simple carbohydrates, increasing vegetables, increasing water intake, decreasing eating out, no skipping meals, meal planning and cooking strategies, keeping healthy foods in the home, and planning for success.  Angel Watkins has agreed to follow-up with our clinic in 2-3 weeks with Dr. Lawson Radar or William Hamburger, NP. She was informed of the importance of frequent follow-up visits to maximize her success with intensive lifestyle modifications for her multiple health conditions.   Objective:   Blood pressure 121/80, pulse 82, temperature 98.1 F (36.7 C), height 5\' 4"  (1.626 m), weight (!) 317 lb (143.8 kg), SpO2 97 %. Body mass index is 54.41 kg/m.  General: Cooperative, alert, well developed, in no acute distress. HEENT: Conjunctivae and lids unremarkable. Cardiovascular: Regular rhythm.  Lungs: Normal work of breathing. Neurologic: No focal deficits.   Lab Results  Component Value Date   CREATININE 0.90 07/30/2020   BUN 17 07/30/2020   NA 142 07/30/2020   K 4.1 07/30/2020   CL 101 07/30/2020   CO2 27 07/30/2020   Lab Results  Component Value Date   ALT 17 07/30/2020   AST 14 07/30/2020   ALKPHOS 73 07/30/2020   BILITOT 0.5 07/30/2020   Lab Results  Component Value Date   HGBA1C 5.4 07/30/2020   HGBA1C 6.5 (H) 04/22/2020   HGBA1C 6.2 (H) 11/29/2018   Lab Results  Component Value Date   INSULIN 6.6 07/30/2020   INSULIN 10.8 04/22/2020   Lab Results  Component Value Date   TSH 1.670 04/22/2020   Lab Results  Component Value Date   CHOL 119 07/30/2020   HDL 63 07/30/2020   LDLCALC 43 07/30/2020   TRIG 62 07/30/2020   Lab Results  Component Value Date   VD25OH 77.9 07/30/2020   VD25OH 46.4 04/22/2020   Lab Results  Component Value Date   WBC 7.4 04/22/2020   HGB 14.6 04/22/2020   HCT 43.1 04/22/2020   MCV 92  04/22/2020   PLT 351 04/22/2020   No results found for: IRON, TIBC, FERRITIN  Attestation Statements:   Reviewed by clinician on day of visit: allergies, medications, problem list, medical history, surgical history, family history, social history, and previous encounter notes.  Time spent on visit including pre-visit chart review and post-visit care and charting was 20 minutes.   I, Jackson Latino, RMA, am acting as Energy manager for [employer], DO.   I have reviewed the above documentation for accuracy and completeness, and I agree with the above. Corinna Capra, DO

## 2020-10-22 ENCOUNTER — Encounter (INDEPENDENT_AMBULATORY_CARE_PROVIDER_SITE_OTHER): Payer: Self-pay | Admitting: Bariatrics

## 2020-11-05 ENCOUNTER — Ambulatory Visit (INDEPENDENT_AMBULATORY_CARE_PROVIDER_SITE_OTHER): Payer: 59 | Admitting: Family Medicine

## 2020-11-05 ENCOUNTER — Encounter (INDEPENDENT_AMBULATORY_CARE_PROVIDER_SITE_OTHER): Payer: Self-pay

## 2020-11-09 ENCOUNTER — Other Ambulatory Visit: Payer: Self-pay

## 2020-11-09 ENCOUNTER — Encounter (INDEPENDENT_AMBULATORY_CARE_PROVIDER_SITE_OTHER): Payer: Self-pay | Admitting: Bariatrics

## 2020-11-09 ENCOUNTER — Ambulatory Visit (INDEPENDENT_AMBULATORY_CARE_PROVIDER_SITE_OTHER): Payer: 59 | Admitting: Bariatrics

## 2020-11-09 VITALS — BP 118/79 | HR 89 | Temp 98.5°F | Ht 64.0 in | Wt 317.0 lb

## 2020-11-09 DIAGNOSIS — I10 Essential (primary) hypertension: Secondary | ICD-10-CM | POA: Diagnosis not present

## 2020-11-09 DIAGNOSIS — Z6841 Body Mass Index (BMI) 40.0 and over, adult: Secondary | ICD-10-CM | POA: Diagnosis not present

## 2020-11-09 DIAGNOSIS — E1165 Type 2 diabetes mellitus with hyperglycemia: Secondary | ICD-10-CM

## 2020-11-09 MED ORDER — OZEMPIC (0.25 OR 0.5 MG/DOSE) 2 MG/1.5ML ~~LOC~~ SOPN: PEN_INJECTOR | SUBCUTANEOUS | 0 refills | Status: DC

## 2020-11-09 NOTE — Progress Notes (Signed)
Chief Complaint:   OBESITY Angel Watkins is here to discuss her progress with her obesity treatment plan along with follow-up of her obesity related diagnoses. Angel Watkins is on the Category 2 Plan and states she is following her eating plan approximately 80% of the time. Angel Watkins states she is doing 0 minutes 0 times per week.  Today's visit was #: 11 Starting weight: 333 lbs Starting date: 04/22/2020 Today's weight: 317 lbs Today's date: 11/09/2020 Total lbs lost to date: 16 lbs Total lbs lost since last in-office visit:  0  Interim History: Angel Watkins has been following the meal fasting well. She is doing well with her water intake. She will increase protein. Her weight has stayed the same.  Subjective:   1. Type 2 diabetes mellitus with hyperglycemia, without long-term current use of insulin (HCC) Angel Watkins is taking Ozempic 0.5 mg currently.  2. Essential hypertension Angel Watkins is taking Avapro.  Assessment/Plan:   1. Type 2 diabetes mellitus with hyperglycemia, without long-term current use of insulin (HCC) We will refill Ozempic 0.5 mg with no refills. [name], neuropathy, limb loss, blindness, coronary artery disease, and death. Intensive lifestyle modification including diet, exercise and weight loss are the first line of treatment for diabetes.   - Semaglutide,0.25 or 0.5MG /DOS, (OZEMPIC, 0.25 OR 0.5 MG/DOSE,) 2 MG/1.5ML SOPN; INJECT 0.5MG  INTO THE SKIN ONCE A WEEK  Dispense: 1.5 mL; Refill: 0  2. Essential hypertension Angel Watkins will continue her medications. She is working on healthy weight loss and exercise to improve blood pressure control. We will watch for signs of hypotension as she continues her lifestyle modifications.  3. Obesity with current BMI of 54.4 Angel Watkins is currently in the action stage of change. As such, her goal is to continue with weight loss efforts. She has agreed to  the Category 2 Plan.   Angel Watkins will continue meal planning and intentional eating.  Exercise goals: No exercise has been prescribed at this time.  Behavioral modification strategies: increasing lean protein intake, decreasing simple carbohydrates, increasing vegetables, increasing water intake, decreasing eating out, no skipping meals, meal planning and cooking strategies, keeping healthy foods in the home, and planning for success.  Angel Watkins has agreed to follow-up with our clinic in 3-4 weeks (fasting) with myself or Alois Cliche. She was informed of the importance of frequent follow-up visits to maximize her success with intensive lifestyle modifications for her multiple health conditions.   Objective:   Blood pressure 118/79, pulse 89, temperature 98.5 F (36.9 C), height 5\' 4"  (1.626 m), weight (!) 317 lb (143.8 kg), SpO2 97 %. Body mass index is 54.41 kg/m.  General: Cooperative, alert, well developed, in no acute distress. HEENT: Conjunctivae and lids unremarkable. Cardiovascular: Regular rhythm.  Lungs: Normal work of breathing. Neurologic: No focal deficits.   Lab Results  Component Value Date   CREATININE 0.90 07/30/2020   BUN 17 07/30/2020   NA 142 07/30/2020   K 4.1 07/30/2020   CL 101 07/30/2020   CO2 27 07/30/2020   Lab Results  Component Value Date   ALT 17 07/30/2020   AST 14 07/30/2020   ALKPHOS 73 07/30/2020   BILITOT 0.5 07/30/2020   Lab Results  Component Value Date   HGBA1C 5.4 07/30/2020   HGBA1C 6.5 (H) 04/22/2020   HGBA1C 6.2 (H) 11/29/2018   Lab Results  Component Value Date   INSULIN 6.6 07/30/2020   INSULIN 10.8 04/22/2020  Lab Results  Component Value Date   TSH 1.670 04/22/2020   Lab Results  Component Value Date   CHOL 119 07/30/2020   HDL 63 07/30/2020   LDLCALC 43 07/30/2020   TRIG 62 07/30/2020   Lab Results  Component Value Date   VD25OH 77.9 07/30/2020   VD25OH 46.4 04/22/2020   Lab Results  Component Value Date    WBC 7.4 04/22/2020   HGB 14.6 04/22/2020   HCT 43.1 04/22/2020   MCV 92 04/22/2020   PLT 351 04/22/2020   No results found for: IRON, TIBC, FERRITIN  Attestation Statements:   Reviewed by clinician on day of visit: allergies, medications, problem list, medical history, surgical history, family history, social history, and previous encounter notes.  I, Jackson Latino, RMA, am acting as Energy manager for [employer], DO.   I have reviewed the above documentation for accuracy and completeness, and I agree with the above. Corinna Capra, DO

## 2020-11-11 ENCOUNTER — Encounter (INDEPENDENT_AMBULATORY_CARE_PROVIDER_SITE_OTHER): Payer: Self-pay | Admitting: Bariatrics

## 2020-12-02 ENCOUNTER — Ambulatory Visit (INDEPENDENT_AMBULATORY_CARE_PROVIDER_SITE_OTHER): Payer: 59 | Admitting: Family Medicine

## 2020-12-02 ENCOUNTER — Other Ambulatory Visit: Payer: Self-pay

## 2020-12-02 ENCOUNTER — Encounter (INDEPENDENT_AMBULATORY_CARE_PROVIDER_SITE_OTHER): Payer: Self-pay | Admitting: Family Medicine

## 2020-12-02 VITALS — BP 115/72 | HR 74 | Temp 98.1°F | Ht 64.0 in | Wt 324.0 lb

## 2020-12-02 DIAGNOSIS — E1165 Type 2 diabetes mellitus with hyperglycemia: Secondary | ICD-10-CM

## 2020-12-02 DIAGNOSIS — E559 Vitamin D deficiency, unspecified: Secondary | ICD-10-CM | POA: Insufficient documentation

## 2020-12-02 DIAGNOSIS — F509 Eating disorder, unspecified: Secondary | ICD-10-CM | POA: Insufficient documentation

## 2020-12-02 DIAGNOSIS — Z9189 Other specified personal risk factors, not elsewhere classified: Secondary | ICD-10-CM | POA: Diagnosis not present

## 2020-12-02 DIAGNOSIS — F5089 Other specified eating disorder: Secondary | ICD-10-CM

## 2020-12-02 DIAGNOSIS — Z6841 Body Mass Index (BMI) 40.0 and over, adult: Secondary | ICD-10-CM

## 2020-12-02 MED ORDER — OZEMPIC (0.25 OR 0.5 MG/DOSE) 2 MG/1.5ML ~~LOC~~ SOPN: PEN_INJECTOR | SUBCUTANEOUS | 0 refills | Status: DC

## 2020-12-02 NOTE — Progress Notes (Signed)
Chief Complaint:   OBESITY Angel Watkins is here to discuss her progress with her obesity treatment plan along with follow-up of her obesity related diagnoses. Angel Watkins is on the Category 2 Plan and states she is following her eating plan approximately 0% of the time. Angel Watkins states she is doing 0 minutes 0 times per week.  Today's visit was #: 12 Starting weight: 333 lbs Starting date: 04/22/2020 Today's weight: 324 lbs Today's date: 12/02/2020 Total lbs lost to date: 9 lbs Total lbs lost since last in-office visit: 0  Interim History: Angel Watkins notes excessive stress eating over the past few weeks. She has been snacking all evening vs eating dinner. She has been eating out some and not meal planning/prepping. She denies intake of sugar sweetened beverages. Her husband brings in snacks which are tempting to her. However, he is supportive overall.   Subjective:   1. Type 2 diabetes mellitus with hyperglycemia, without long-term current use of insulin (HCC) Angel Watkins's Type 2 diabetes is well controlled. She is on Ozempic 0.5 mg weekly. She denies nausea or constipation. She states stool softener works well. Her appetite is well controlled, especially when she eats on plan.   Lab Results  Component Value Date   HGBA1C 5.4 07/30/2020   HGBA1C 6.5 (H) 04/22/2020   HGBA1C 6.2 (H) 11/29/2018   Lab Results  Component Value Date   LDLCALC 43 07/30/2020   CREATININE 0.90 07/30/2020   Lab Results  Component Value Date   INSULIN 6.6 07/30/2020   INSULIN 10.8 04/22/2020    2. Vitamin D deficiency Alleta is on 1000 OTC Vitamin D. Her Vitamin D was at goal.   Lab Results  Component Value Date   VD25OH 77.9 07/30/2020   VD25OH 46.4 04/22/2020    3. Other disorder of eating/emotional eating Angel Watkins notes excessive stress eating over the last few weeks. She is concerned about her health issues.   4. At risk for deficient intake of food Angel Watkins is at risk for deficient intake of food due to meal  skipping.  Assessment/Plan:   1. Type 2 diabetes mellitus with hyperglycemia, without long-term current use of insulin (HCC) We will refill Ozempic 0.5 mg weekly. We will check labs today. - Hemoglobin A1c - Comprehensive metabolic panel - Insulin, random - Semaglutide,0.25 or 0.5MG /DOS, (OZEMPIC, 0.25 OR 0.5 MG/DOSE,) 2 MG/1.5ML SOPN; INJECT 0.5MG  INTO THE SKIN ONCE A WEEK  Dispense: 1.5 mL; Refill: 0  2. Vitamin D deficiency We will check labs today. Luan agrees to continue to take OTC Vitamin D 1,000 IU every day and she will follow-up for routine testing of Vitamin D, at least 2-3 times per year to avoid over-replacement.    - VITAMIN D 25 Hydroxy (Vit-D Deficiency, Fractures)  3. Other disorder of eating/emotional eating Behavior modification techniques were discussed today to help Angel Watkins deal with her emotional/non-hunger eating behaviors.  Angel Watkins declines to see Dr. Dewaine Conger our Bariatric Psychologist. She feels she knows what she needs to do. We discussed having her husband keep his snacks out of sight.   4. At risk for deficient intake of food Angel Watkins was given approximately 15 minutes of deficit intake of food prevention counseling today. Angel Watkins is at risk for eating too few calories based on current food recall. She was encouraged to focus on meeting caloric and protein goals according to her recommended meal plan.    5. Obesity with current BMI of 55.59 Angel Watkins is currently in the action stage of change. As such, her  goal is to continue with weight loss efforts. She has agreed to the Category 2 Plan.   Angel Watkins will speak with her husband about helping her with recommitting to the plan.  Exercise goals: No exercise has been prescribed at this time.  Behavioral modification strategies: increasing lean protein intake, decreasing simple carbohydrates, no skipping meals, meal planning and cooking strategies, and dealing with family or coworker sabotage.  Angel Watkins has agreed to  follow-up with our clinic in 3 weeks with Dr. Sharee Holster.  Objective:   Blood pressure 115/72, pulse 74, temperature 98.1 F (36.7 C), height 5\' 4"  (1.626 m), weight (!) 324 lb (147 kg), SpO2 100 %. Body mass index is 55.61 kg/m.  General: Cooperative, alert, well developed, in no acute distress. HEENT: Conjunctivae and lids unremarkable. Cardiovascular: Regular rhythm.  Lungs: Normal work of breathing. Neurologic: No focal deficits.   Lab Results  Component Value Date   CREATININE 0.90 07/30/2020   BUN 17 07/30/2020   NA 142 07/30/2020   K 4.1 07/30/2020   CL 101 07/30/2020   CO2 27 07/30/2020   Lab Results  Component Value Date   ALT 17 07/30/2020   AST 14 07/30/2020   ALKPHOS 73 07/30/2020   BILITOT 0.5 07/30/2020   Lab Results  Component Value Date   HGBA1C 5.4 07/30/2020   HGBA1C 6.5 (H) 04/22/2020   HGBA1C 6.2 (H) 11/29/2018   Lab Results  Component Value Date   INSULIN 6.6 07/30/2020   INSULIN 10.8 04/22/2020   Lab Results  Component Value Date   TSH 1.670 04/22/2020   Lab Results  Component Value Date   CHOL 119 07/30/2020   HDL 63 07/30/2020   LDLCALC 43 07/30/2020   TRIG 62 07/30/2020   Lab Results  Component Value Date   VD25OH 77.9 07/30/2020   VD25OH 46.4 04/22/2020   Lab Results  Component Value Date   WBC 7.4 04/22/2020   HGB 14.6 04/22/2020   HCT 43.1 04/22/2020   MCV 92 04/22/2020   PLT 351 04/22/2020   No results found for: IRON, TIBC, FERRITIN  Attestation Statements:   Reviewed by clinician on day of visit: allergies, medications, problem list, medical history, surgical history, family history, social history, and previous encounter notes.  I, 04/24/2020, RMA, am acting as [place] for Energy manager, FNP.   I have reviewed the above documentation for accuracy and completeness, and I agree with the above. -  [place], FNP \

## 2020-12-04 LAB — COMPREHENSIVE METABOLIC PANEL
ALT: 28 IU/L (ref 0–32)
AST: 16 IU/L (ref 0–40)
Albumin/Globulin Ratio: 1.7 (ref 1.2–2.2)
Albumin: 3.8 g/dL (ref 3.8–4.9)
Alkaline Phosphatase: 106 IU/L (ref 44–121)
BUN/Creatinine Ratio: 24 — ABNORMAL HIGH (ref 9–23)
BUN: 20 mg/dL (ref 6–24)
Bilirubin Total: 0.4 mg/dL (ref 0.0–1.2)
CO2: 24 mmol/L (ref 20–29)
Calcium: 9.3 mg/dL (ref 8.7–10.2)
Chloride: 104 mmol/L (ref 96–106)
Creatinine, Ser: 0.82 mg/dL (ref 0.57–1.00)
Globulin, Total: 2.2 g/dL (ref 1.5–4.5)
Glucose: 107 mg/dL — ABNORMAL HIGH (ref 70–99)
Potassium: 4.4 mmol/L (ref 3.5–5.2)
Sodium: 143 mmol/L (ref 134–144)
Total Protein: 6 g/dL (ref 6.0–8.5)
eGFR: 83 mL/min/{1.73_m2} (ref >59)

## 2020-12-04 LAB — HEMOGLOBIN A1C
Est. average glucose Bld gHb Est-mCnc: 123 mg/dL
Hgb A1c MFr Bld: 5.9 % — ABNORMAL HIGH (ref 4.8–5.6)

## 2020-12-04 LAB — INSULIN, RANDOM: INSULIN: 11.1 u[IU]/mL (ref 2.6–24.9)

## 2020-12-04 LAB — VITAMIN D 25 HYDROXY (VIT D DEFICIENCY, FRACTURES): Vit D, 25-Hydroxy: 45 ng/mL (ref 30.0–100.0)

## 2020-12-22 ENCOUNTER — Encounter (INDEPENDENT_AMBULATORY_CARE_PROVIDER_SITE_OTHER): Payer: Self-pay

## 2020-12-22 ENCOUNTER — Ambulatory Visit (INDEPENDENT_AMBULATORY_CARE_PROVIDER_SITE_OTHER): Payer: 59 | Admitting: Family Medicine

## 2021-01-18 ENCOUNTER — Other Ambulatory Visit: Payer: Self-pay

## 2021-01-18 ENCOUNTER — Ambulatory Visit (INDEPENDENT_AMBULATORY_CARE_PROVIDER_SITE_OTHER): Payer: 59 | Admitting: Family Medicine

## 2021-01-18 ENCOUNTER — Encounter (INDEPENDENT_AMBULATORY_CARE_PROVIDER_SITE_OTHER): Payer: Self-pay | Admitting: Family Medicine

## 2021-01-18 VITALS — BP 110/78 | HR 98 | Temp 98.6°F | Ht 64.0 in | Wt 316.0 lb

## 2021-01-18 DIAGNOSIS — E1159 Type 2 diabetes mellitus with other circulatory complications: Secondary | ICD-10-CM

## 2021-01-18 DIAGNOSIS — Z6841 Body Mass Index (BMI) 40.0 and over, adult: Secondary | ICD-10-CM

## 2021-01-18 DIAGNOSIS — E559 Vitamin D deficiency, unspecified: Secondary | ICD-10-CM

## 2021-01-18 DIAGNOSIS — I152 Hypertension secondary to endocrine disorders: Secondary | ICD-10-CM

## 2021-01-18 DIAGNOSIS — E1165 Type 2 diabetes mellitus with hyperglycemia: Secondary | ICD-10-CM | POA: Diagnosis not present

## 2021-01-18 MED ORDER — VITAMIN D3 125 MCG (5000 UT) PO CAPS
1.0000 | ORAL_CAPSULE | Freq: Every day | ORAL | 0 refills | Status: DC
Start: 2021-01-18 — End: 2022-07-26

## 2021-01-18 NOTE — Progress Notes (Signed)
Chief Complaint:   OBESITY Angel Watkins is here to discuss her progress with her obesity treatment plan along with follow-up of her obesity related diagnoses. Angel Watkins is on the Category 2 Plan and states she is following her eating plan approximately 75% of the time. Angel Watkins states she is not currently exercising.  Today's visit was #: 13 Starting weight: 333 lbs Starting date: 04/22/2020 Today's weight: 316 lbs Today's date: 01/18/2021 Total lbs lost to date: 17 Total lbs lost since last in-office visit: 8  Interim History: Angel Watkins had COVID-19 since her last appt. It took her weeks (up to 3 weeks) to start feeling tolerable. She is feeling ~90% normal now. Pt is definitely feeling a difference of med in Ozempic 1 mg. She is eating every few hours. Pt is going to her aunt's house for Christmas.  Subjective:   1. Type 2 diabetes mellitus with hyperglycemia, without long-term current use of insulin (HCC) Angel Watkins is noticing a change of intake ability on Ozempic 1 mg and is not checking blood sugar. His recent A1c was 5.9.  2. Hypertension associated with diabetes (HCC) BP controlled today. Pt denies chest pain/chest pressure/headache.  3. Vitamin D deficiency Angel Watkins's level dropped from 77.9 to 45.0. She reports fatigue.  Assessment/Plan:   1. Type 2 diabetes mellitus with hyperglycemia, without long-term current use of insulin (HCC) [name], neuropathy, limb loss, blindness, coronary artery disease, and death. Intensive lifestyle modification including diet, exercise and weight loss are the first line of treatment for diabetes. Continue Ozempic 1 mg.  2. Hypertension associated with diabetes (HCC) Angel Watkins is working on healthy weight loss and exercise to improve blood pressure control. We will watch for signs of hypotension as she continues her lifestyle modifications. Continue  irbesartan.  3. Vitamin D deficiency Low Vitamin D level contributes to fatigue and are associated with obesity, breast, and colon cancer. She agrees to increase OTC Vitamin D to 5,000 IU daily and will follow-up for routine testing of Vitamin D, at least 2-3 times per year to avoid over-replacement.  Increase to- Cholecalciferol (VITAMIN D3) 125 MCG (5000 UT) CAPS; Take 1 capsule (5,000 Units total) by mouth daily.  Dispense: 30 capsule; Refill: 0  4. Obesity with current BMI of 54.3  Angel Watkins is currently in the action stage of change. As such, her goal is to continue with weight loss efforts. She has agreed to the Category 2 Plan.   Exercise goals:  As is  Behavioral modification strategies: increasing lean protein intake, no skipping meals, meal planning and cooking strategies, holiday eating strategies , and avoiding temptations.  Angel Watkins has agreed to follow-up with our clinic in 3 weeks. She was informed of the importance of frequent follow-up visits to maximize her success with intensive lifestyle modifications for her multiple health conditions.   Objective:   Blood pressure 110/78, pulse 98, temperature 98.6 F (37 C), height 5\' 4"  (1.626 m), weight (!) 316 lb (143.3 kg), SpO2 99 %. Body mass index is 54.24 kg/m.  General: Cooperative, alert, well developed, in no acute distress. HEENT: Conjunctivae and lids unremarkable. Cardiovascular: Regular rhythm.  Lungs: Normal work of breathing. Neurologic: No focal deficits.   Lab Results  Component Value Date   CREATININE 0.82 12/02/2020   BUN 20 12/02/2020   NA 143 12/02/2020   K 4.4 12/02/2020   CL 104 12/02/2020   CO2 24 12/02/2020   Lab Results  Component Value Date   ALT 28 12/02/2020   AST 16 12/02/2020   ALKPHOS 106 12/02/2020   BILITOT 0.4 12/02/2020   Lab Results  Component Value Date   HGBA1C 5.9 (H) 12/02/2020   HGBA1C 5.4 07/30/2020   HGBA1C 6.5 (H) 04/22/2020   HGBA1C 6.2 (H) 11/29/2018   Lab Results   Component Value Date   INSULIN 11.1 12/02/2020   INSULIN 6.6 07/30/2020   INSULIN 10.8 04/22/2020   Lab Results  Component Value Date   TSH 1.670 04/22/2020   Lab Results  Component Value Date   CHOL 119 07/30/2020   HDL 63 07/30/2020   LDLCALC 43 07/30/2020   TRIG 62 07/30/2020   Lab Results  Component Value Date   VD25OH 45.0 12/02/2020   VD25OH 77.9 07/30/2020   VD25OH 46.4 04/22/2020   Lab Results  Component Value Date   WBC 7.4 04/22/2020   HGB 14.6 04/22/2020   HCT 43.1 04/22/2020   MCV 92 04/22/2020   PLT 351 04/22/2020    Attestation Statements:   Reviewed by clinician on day of visit: allergies, medications, problem list, medical history, surgical history, family history, social history, and previous encounter notes.  Angel Watkins, CMA, am acting as transcriptionist for Reuben Likes, MD.   I have reviewed the above documentation for accuracy and completeness, and I agree with the above. - Reuben Likes, MD

## 2021-02-15 ENCOUNTER — Other Ambulatory Visit: Payer: Self-pay

## 2021-02-15 ENCOUNTER — Ambulatory Visit (INDEPENDENT_AMBULATORY_CARE_PROVIDER_SITE_OTHER): Payer: 59 | Admitting: Family Medicine

## 2021-02-15 ENCOUNTER — Encounter (INDEPENDENT_AMBULATORY_CARE_PROVIDER_SITE_OTHER): Payer: Self-pay | Admitting: Family Medicine

## 2021-02-15 VITALS — BP 117/80 | HR 71 | Temp 98.0°F | Ht 64.0 in | Wt 324.0 lb

## 2021-02-15 DIAGNOSIS — E1165 Type 2 diabetes mellitus with hyperglycemia: Secondary | ICD-10-CM

## 2021-02-15 DIAGNOSIS — E785 Hyperlipidemia, unspecified: Secondary | ICD-10-CM | POA: Diagnosis not present

## 2021-02-15 DIAGNOSIS — E1169 Type 2 diabetes mellitus with other specified complication: Secondary | ICD-10-CM

## 2021-02-15 DIAGNOSIS — E669 Obesity, unspecified: Secondary | ICD-10-CM | POA: Diagnosis not present

## 2021-02-15 DIAGNOSIS — Z6841 Body Mass Index (BMI) 40.0 and over, adult: Secondary | ICD-10-CM

## 2021-02-15 NOTE — Progress Notes (Signed)
Chief Complaint:   OBESITY Angel Watkins is here to discuss her progress with her obesity treatment plan along with follow-up of her obesity related diagnoses. Angel Watkins is on the Category 2 Plan and states she is following her eating plan approximately 75% of the time. Angel Watkins states she is not currently exercising.  Today's visit was #: 14 Starting weight: 333 lbs Starting date: 04/22/2020 Today's weight: 324 lbs Today's date: 02/15/2021 Total lbs lost to date: 9 Total lbs lost since last in-office visit: 0  Interim History: Pt did slip into old habits of not eating over the holiday. Breakfast and lunch are not an issue but pt finds dinner to be difficult. Breakfast is yogurt and fruit cup with belvita crackers. Lunch is [employer]. There may be a fruit cup or belvita crackers or mini popcorn. Pt reports lack of hunger, motivation, and energy. She has no upcoming plans over the next few weeks.  Subjective:   1. Hyperlipidemia associated with type 2 diabetes mellitus (HCC) Pt is on Lipitor with no myalgias or transaminitis.  2. Type 2 diabetes mellitus with hyperglycemia, without long-term current use of insulin (HCC) Pt is doing okay on Ozempic. She reports slight control of appetite.  Assessment/Plan:   1. Hyperlipidemia associated with type 2 diabetes mellitus (HCC) Cardiovascular risk and specific lipid/LDL goals reviewed.  We discussed several lifestyle modifications today and Angel Watkins will continue to work on diet, exercise and weight loss efforts. Orders and follow up as documented in patient record. Continue current treatment plan.  Counseling Intensive lifestyle modifications are the first line treatment for this issue. Dietary changes: Increase soluble fiber. Decrease simple carbohydrates. Exercise changes: Moderate to vigorous-intensity aerobic activity 150 minutes per week if tolerated. Lipid-lowering medications: see documented in medical record.  2. Type 2 diabetes  mellitus with hyperglycemia, without long-term current use of insulin (HCC) [name], neuropathy, limb loss, blindness, coronary artery disease, and death. Intensive lifestyle modification including diet, exercise and weight loss are the first line of treatment for diabetes. Continue current treatment plan. Labs in February.  3. Obesity with current BMI of 55.8  Angel Watkins is currently in the action stage of change. As such, her goal is to continue with weight loss efforts. She has agreed to the Category 2 Plan.   Exercise goals: No exercise has been prescribed at this time.  Behavioral modification strategies: increasing lean protein intake, no skipping meals, meal planning and cooking strategies, and planning for success.  Angel Watkins has agreed to follow-up with our clinic in 3 weeks, fasting. She was informed of the importance of frequent follow-up visits to maximize her success with intensive lifestyle modifications for her multiple health conditions.   Objective:   Blood pressure 117/80, pulse 71, temperature 98 F (36.7 C), height 5\' 4"  (1.626 m), weight (!) 324 lb (147 kg), SpO2 100 %. Body mass index is 55.61 kg/m.  General: Cooperative, alert, well developed, in no acute distress. HEENT: Conjunctivae and lids unremarkable. Cardiovascular: Regular rhythm.  Lungs: Normal work of breathing. Neurologic: No focal deficits.   Lab Results  Component Value Date   CREATININE 0.82 12/02/2020   BUN 20 12/02/2020   NA 143 12/02/2020   K 4.4 12/02/2020   CL 104 12/02/2020   CO2 24 12/02/2020   Lab Results  Component Value Date   ALT 28 12/02/2020   AST 16 12/02/2020   ALKPHOS 106 12/02/2020  BILITOT 0.4 12/02/2020   Lab Results  Component Value Date   HGBA1C 5.9 (H) 12/02/2020   HGBA1C 5.4 07/30/2020   HGBA1C 6.5 (H) 04/22/2020   HGBA1C 6.2 (H) 11/29/2018   Lab Results   Component Value Date   INSULIN 11.1 12/02/2020   INSULIN 6.6 07/30/2020   INSULIN 10.8 04/22/2020   Lab Results  Component Value Date   TSH 1.670 04/22/2020   Lab Results  Component Value Date   CHOL 119 07/30/2020   HDL 63 07/30/2020   LDLCALC 43 07/30/2020   TRIG 62 07/30/2020   Lab Results  Component Value Date   VD25OH 45.0 12/02/2020   VD25OH 77.9 07/30/2020   VD25OH 46.4 04/22/2020   Lab Results  Component Value Date   WBC 7.4 04/22/2020   HGB 14.6 04/22/2020   HCT 43.1 04/22/2020   MCV 92 04/22/2020   PLT 351 04/22/2020    Attestation Statements:   Reviewed by clinician on day of visit: allergies, medications, problem list, medical history, surgical history, family history, social history, and previous encounter notes.  Edmund Hilda, CMA, am acting as transcriptionist for Reuben Likes, MD.  I have reviewed the above documentation for accuracy and completeness, and I agree with the above. - Reuben Likes, MD

## 2021-03-15 ENCOUNTER — Other Ambulatory Visit: Payer: Self-pay

## 2021-03-15 ENCOUNTER — Ambulatory Visit (INDEPENDENT_AMBULATORY_CARE_PROVIDER_SITE_OTHER): Payer: 59 | Admitting: Family Medicine

## 2021-03-15 ENCOUNTER — Encounter (INDEPENDENT_AMBULATORY_CARE_PROVIDER_SITE_OTHER): Payer: Self-pay | Admitting: Family Medicine

## 2021-03-15 VITALS — BP 109/73 | HR 69 | Temp 98.1°F | Ht 64.0 in | Wt 317.0 lb

## 2021-03-15 DIAGNOSIS — E559 Vitamin D deficiency, unspecified: Secondary | ICD-10-CM

## 2021-03-15 DIAGNOSIS — E1159 Type 2 diabetes mellitus with other circulatory complications: Secondary | ICD-10-CM

## 2021-03-15 DIAGNOSIS — Z6841 Body Mass Index (BMI) 40.0 and over, adult: Secondary | ICD-10-CM

## 2021-03-15 DIAGNOSIS — I152 Hypertension secondary to endocrine disorders: Secondary | ICD-10-CM | POA: Diagnosis not present

## 2021-03-15 DIAGNOSIS — E1165 Type 2 diabetes mellitus with hyperglycemia: Secondary | ICD-10-CM

## 2021-03-15 DIAGNOSIS — Z7985 Long-term (current) use of injectable non-insulin antidiabetic drugs: Secondary | ICD-10-CM

## 2021-03-15 DIAGNOSIS — E669 Obesity, unspecified: Secondary | ICD-10-CM

## 2021-03-16 LAB — COMPREHENSIVE METABOLIC PANEL
ALT: 26 IU/L (ref 0–32)
AST: 23 IU/L (ref 0–40)
Albumin/Globulin Ratio: 2.3 — ABNORMAL HIGH (ref 1.2–2.2)
Albumin: 4.5 g/dL (ref 3.8–4.9)
Alkaline Phosphatase: 114 IU/L (ref 44–121)
BUN/Creatinine Ratio: 23 (ref 9–23)
BUN: 19 mg/dL (ref 6–24)
Bilirubin Total: 0.5 mg/dL (ref 0.0–1.2)
CO2: 25 mmol/L (ref 20–29)
Calcium: 9.7 mg/dL (ref 8.7–10.2)
Chloride: 103 mmol/L (ref 96–106)
Creatinine, Ser: 0.82 mg/dL (ref 0.57–1.00)
Globulin, Total: 2 g/dL (ref 1.5–4.5)
Glucose: 94 mg/dL (ref 70–99)
Potassium: 4.6 mmol/L (ref 3.5–5.2)
Sodium: 143 mmol/L (ref 134–144)
Total Protein: 6.5 g/dL (ref 6.0–8.5)
eGFR: 83 mL/min/{1.73_m2} (ref >59)

## 2021-03-16 LAB — HEMOGLOBIN A1C
Est. average glucose Bld gHb Est-mCnc: 120 mg/dL
Hgb A1c MFr Bld: 5.8 % — ABNORMAL HIGH (ref 4.8–5.6)

## 2021-03-16 LAB — VITAMIN D 25 HYDROXY (VIT D DEFICIENCY, FRACTURES): Vit D, 25-Hydroxy: 47.9 ng/mL (ref 30.0–100.0)

## 2021-03-16 LAB — INSULIN, RANDOM: INSULIN: 9.6 u[IU]/mL (ref 2.6–24.9)

## 2021-03-16 NOTE — Progress Notes (Signed)
Chief Complaint:   OBESITY Angel Watkins is here to discuss her progress with her obesity treatment plan along with follow-up of her obesity related diagnoses. [name] is on the Category 2 Plan and states she is following her eating plan approximately 95% of the time. [name] states she is not currently exercising.  Today's visit was #: 15 Starting weight: 333 lbs Starting date: 04/22/2020 Today's weight: 317 lbs Today's date: 03/15/2021 Total lbs lost to date: 16 Total lbs lost since last in-office visit: 7  Interim History: Pt has been more mindful over the last few weeks. Super Bowl Sunday is a big deal for her and her husband. Her husband made some indulgent food and she was able to enjoy some food but stay in control. Pt does feel some GI upset today. Her husband is participating in eating differently. She has dinner with friends coming up.   Subjective:   1. Vitamin D deficiency [name] denies nausea, vomiting, and muscle weakness but notes fatigue. She is on OTC Vit D.  2. Type 2 diabetes mellitus with hyperglycemia, without long-term current use of insulin (HCC) Pt is on Ozempic 1 mg and reports improved sxs.  3. Hypertension associated with diabetes (HCC) BP controlled today. [name] denies chest pain/chest pressure/headache. She is on Avapro and Demadex.  Assessment/Plan:   1. Vitamin D deficiency Low Vitamin D level contributes to fatigue and are associated with obesity, breast, and colon cancer. She agrees to continue to take OTC Vitamin D and will follow-up for routine testing of Vitamin D, at least 2-3 times per year to avoid over-replacement. Check labs today.  - VITAMIN D 25 Hydroxy (Vit-D Deficiency, Fractures)  2. Type 2 diabetes mellitus with hyperglycemia, without long-term current use of insulin (HCC) [name], neuropathy, limb loss, blindness, coronary artery  disease, and death. Intensive lifestyle modification including diet, [name]  - Hemoglobin A1c - Insulin, random  3. Hypertension associated with diabetes (HCC) [name] is working on healthy weight loss and exercise to improve blood pressure control. We will watch for signs of hypotension as she continues her lifestyle modifications. Check labs today.  - Comprehensive metabolic panel  4. Obesity with current BMI of 54.4 [name] is currently in the action stage of change. As such, her goal is to continue with weight loss efforts. She has agreed to the Category 2 Plan.   Exercise goals: All adults should avoid inactivity. Some physical activity is better than none, and adults who participate in any amount of physical activity gain some health benefits.  Behavioral modification strategies: increasing lean protein intake, meal planning and cooking strategies, and keeping healthy foods in the home.  [name] has agreed to follow-up with our clinic in 3-4 weeks. She was informed of the importance of frequent follow-up visits to maximize her success with intensive lifestyle modifications for her multiple health conditions.   [name] was informed we would discuss her lab results at her next visit unless there is a critical issue that needs to be addressed sooner. [name] agreed to keep her next visit at the agreed upon time to discuss these results.  Objective:   Blood pressure 109/73, pulse 69, temperature 98.1 F (36.7 C), height 5\' 4"  (1.626 m), weight (!) 317 lb (143.8 kg), SpO2 99 %. Body mass index is 54.41 kg/m.  General: Cooperative, alert, well  developed, in no acute distress. HEENT: Conjunctivae and lids unremarkable. Cardiovascular: Regular rhythm.  Lungs: Normal work of breathing. Neurologic: No focal deficits.   Lab Results  Component Value Date   CREATININE 0.82 03/15/2021   BUN 19 03/15/2021   NA  143 03/15/2021   K 4.6 03/15/2021   CL 103 03/15/2021   CO2 25 03/15/2021   Lab Results  Component Value Date   ALT 26 03/15/2021   AST 23 03/15/2021   ALKPHOS 114 03/15/2021   BILITOT 0.5 03/15/2021   Lab Results  Component Value Date   HGBA1C 5.8 (H) 03/15/2021   HGBA1C 5.9 (H) 12/02/2020   HGBA1C 5.4 07/30/2020   HGBA1C 6.5 (H) 04/22/2020   HGBA1C 6.2 (H) 11/29/2018   Lab Results  Component Value Date   INSULIN 9.6 03/15/2021   INSULIN 11.1 12/02/2020   INSULIN 6.6 07/30/2020   INSULIN 10.8 04/22/2020   Lab Results  Component Value Date   TSH 1.670 04/22/2020   Lab Results  Component Value Date   CHOL 119 07/30/2020   HDL 63 07/30/2020   LDLCALC 43 07/30/2020   TRIG 62 07/30/2020   Lab Results  Component Value Date   VD25OH 47.9 03/15/2021   VD25OH 45.0 12/02/2020   VD25OH 77.9 07/30/2020   Lab Results  Component Value Date   WBC 7.4 04/22/2020   HGB 14.6 04/22/2020   HCT 43.1 04/22/2020   MCV 92 04/22/2020   PLT 351 04/22/2020    Attestation Statements:   Reviewed by clinician on day of visit: allergies, medications, problem list, medical history, surgical history, family history, social history, and previous encounter notes.  Edmund Hilda, CMA, am acting as transcriptionist for Reuben Likes, MD.   I have reviewed the above documentation for accuracy and completeness, and I agree with the above. - Reuben Likes, MD

## 2021-04-01 ENCOUNTER — Encounter (INDEPENDENT_AMBULATORY_CARE_PROVIDER_SITE_OTHER): Payer: Self-pay | Admitting: Family Medicine

## 2021-04-05 NOTE — Telephone Encounter (Signed)
Please advise 

## 2021-04-12 ENCOUNTER — Other Ambulatory Visit: Payer: Self-pay

## 2021-04-12 ENCOUNTER — Ambulatory Visit (INDEPENDENT_AMBULATORY_CARE_PROVIDER_SITE_OTHER): Payer: 59 | Admitting: Family Medicine

## 2021-04-12 ENCOUNTER — Encounter (INDEPENDENT_AMBULATORY_CARE_PROVIDER_SITE_OTHER): Payer: Self-pay | Admitting: Family Medicine

## 2021-04-12 VITALS — BP 116/75 | HR 69 | Temp 98.1°F | Ht 64.0 in | Wt 301.0 lb

## 2021-04-12 DIAGNOSIS — E669 Obesity, unspecified: Secondary | ICD-10-CM

## 2021-04-12 DIAGNOSIS — Z9884 Bariatric surgery status: Secondary | ICD-10-CM | POA: Diagnosis not present

## 2021-04-12 DIAGNOSIS — Z6841 Body Mass Index (BMI) 40.0 and over, adult: Secondary | ICD-10-CM | POA: Diagnosis not present

## 2021-04-12 DIAGNOSIS — E7849 Other hyperlipidemia: Secondary | ICD-10-CM | POA: Diagnosis not present

## 2021-04-13 NOTE — Progress Notes (Signed)
? ? ? ?Chief Complaint:  ? ?OBESITY ?Angel Watkins is here to discuss her progress with her obesity treatment plan along with follow-up of her obesity related diagnoses. Angel Watkins is on the Category 2 Plan and states she is following her eating plan approximately 90% of the time. Angel Watkins states she is doing 0 minutes 0 times per week. ? ?Today's visit was #: 16 ?Starting weight: 333 lbs ?Starting date: 04/22/2020 ?Today's weight: 301 lbs ?Today's date: 04/12/2021 ?Total lbs lost to date: 46 ?Total lbs lost since last in-office visit: 16 ? ?Interim History: Angel Watkins is doing very well on compliance to the Category 2. She did notice some hunger in the AM after indulging in higher calorie snacks.she did recognize the connection. She is actually noticing some hunger intermittently as well. She has a vacation coming up, and this is the first in 5 years. She does anticipate buying groceries while away. ? ?Subjective:  ? ?1. 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 She is wondering about lap band. ? ?2. Other hyperlipidemia ?Angel Watkins is on Lipitor, and she denies myalgias or transaminitis.  ? ?Assessment/Plan:  ? ?1. History of bariatric surgery ?Angel Watkins is to continue her Category plan, and may consider following up with [facility] (already called and got the paperwork). ? ?2. Other hyperlipidemia ?Angel Watkins's levels are controlled and we will follow up on labs in 3 months. ? ?3. Obesity with current BMI of 51.8 ?Angel Watkins is currently in the action stage of change. As such, her goal is to continue with weight loss efforts. She has agreed to the Category 3 Plan with 6 oz of protein at supper.  ? ?Exercise goals: No exercise has been prescribed at this time. ? ?Behavioral modification strategies: increasing lean protein intake, meal planning and cooking strategies, keeping healthy foods in the home, and planning for success. ? ?Angel Watkins has agreed to follow-up with our clinic in 3 to 4 weeks. She was informed  of the importance of frequent follow-up visits to maximize her success with intensive lifestyle modifications for her multiple health conditions.  ? ?Objective:  ? ?Blood pressure 116/75, pulse 69, temperature 98.1 ?F (36.7 ?C), height 5\' 4"  (1.626 m), weight (!) 301 lb (136.5 kg), SpO2 99 %. ?Body mass index is 51.67 kg/m?. ? ?General: Cooperative, alert, well developed, in no acute distress. ?HEENT: Conjunctivae and lids unremarkable. ?Cardiovascular: Regular rhythm.  ?Lungs: Normal work of breathing. ?Neurologic: No focal deficits.  ? ?Lab Results  ?Component Value Date  ? CREATININE 0.82 03/15/2021  ? BUN 19 03/15/2021  ? NA 143 03/15/2021  ? K 4.6 03/15/2021  ? CL 103 03/15/2021  ? CO2 25 03/15/2021  ? ?Lab Results  ?Component Value Date  ? ALT 26 03/15/2021  ? AST 23 03/15/2021  ? ALKPHOS 114 03/15/2021  ? BILITOT 0.5 03/15/2021  ? ?Lab Results  ?Component Value Date  ? HGBA1C 5.8 (H) 03/15/2021  ? HGBA1C 5.9 (H) 12/02/2020  ? HGBA1C 5.4 07/30/2020  ? HGBA1C 6.5 (H) 04/22/2020  ? HGBA1C 6.2 (H) 11/29/2018  ? ?Lab Results  ?Component Value Date  ? INSULIN 9.6 03/15/2021  ? INSULIN 11.1 12/02/2020  ? INSULIN 6.6 07/30/2020  ? INSULIN 10.8 04/22/2020  ? ?Lab Results  ?Component Value Date  ? TSH 1.670 04/22/2020  ? ?Lab Results  ?Component Value Date  ? CHOL 119 07/30/2020  ? HDL 63 07/30/2020  ? LDLCALC 43 07/30/2020  ? TRIG 62 07/30/2020  ? ?Lab Results  ?Component Value Date  ?  VD25OH 47.9 03/15/2021  ? VD25OH 45.0 12/02/2020  ? VD25OH 77.9 07/30/2020  ? ?Lab Results  ?Component Value Date  ? WBC 7.4 04/22/2020  ? HGB 14.6 04/22/2020  ? HCT 43.1 04/22/2020  ? MCV 92 04/22/2020  ? PLT 351 04/22/2020  ? ?No results found for: IRON, TIBC, FERRITIN ? ?Attestation Statements:  ? ?Reviewed by clinician on day of visit: allergies, medications, problem list, medical history, surgical history, family history, social history, and previous encounter notes. ? ? ?I, Burt Knack, am acting as transcriptionist for  Reuben Likes, MD. ? ?I have reviewed the above documentation for accuracy and completeness, and I agree with the above. Reuben Likes, MD ? ? ?

## 2021-05-11 ENCOUNTER — Encounter (INDEPENDENT_AMBULATORY_CARE_PROVIDER_SITE_OTHER): Payer: Self-pay | Admitting: Family Medicine

## 2021-05-11 ENCOUNTER — Ambulatory Visit (INDEPENDENT_AMBULATORY_CARE_PROVIDER_SITE_OTHER): Payer: 59 | Admitting: Family Medicine

## 2021-05-11 VITALS — BP 109/71 | HR 71 | Temp 97.6°F | Ht 64.0 in | Wt 308.0 lb

## 2021-05-11 DIAGNOSIS — E1169 Type 2 diabetes mellitus with other specified complication: Secondary | ICD-10-CM

## 2021-05-11 DIAGNOSIS — E785 Hyperlipidemia, unspecified: Secondary | ICD-10-CM | POA: Diagnosis not present

## 2021-05-11 DIAGNOSIS — Z6841 Body Mass Index (BMI) 40.0 and over, adult: Secondary | ICD-10-CM

## 2021-05-11 DIAGNOSIS — E1159 Type 2 diabetes mellitus with other circulatory complications: Secondary | ICD-10-CM

## 2021-05-11 DIAGNOSIS — I152 Hypertension secondary to endocrine disorders: Secondary | ICD-10-CM

## 2021-05-11 DIAGNOSIS — E669 Obesity, unspecified: Secondary | ICD-10-CM

## 2021-05-12 NOTE — Progress Notes (Signed)
? ? ? ?Chief Complaint:  ? ?OBESITY ?Angel Watkins is here to discuss her progress with her obesity treatment plan along with follow-up of her obesity related diagnoses. Angel Watkins is on the Category 3 Plan with 6 oz of protein at supper and states she is following her eating plan approximately 60% of the time. Angel Watkins states she is doing 0 minutes 0 times per week. ? ?Today's visit was #: 32 ?Starting weight: 333 lbs ?Starting date: 04/22/2020 ?Today's weight: 308 lbs ?Today's date: 05/11/2021 ?Total lbs lost to date: 25 ?Total lbs lost since last in-office visit: 0 ? ?Interim History: Angel Watkins reports a "bad month", and she had a stomach virus that seemed to be endless in terms of time. Experienced significant  nausea, vomiting, or diarrhea. Ultimately did go see her doctor due to needing some relief from symptoms. She wants to get more on track in terms of meal plan.  ? ?Subjective:  ? ?1. Hyperlipidemia associated with type 2 diabetes mellitus ([place]) ?Angel Watkins is on statin with no myalgias or transaminitis.  ? ?2. Hypertension associated with diabetes ([place]) ?Angel Watkins's blood pressure is controlled (even with recent GI illness, with no hypotension or hypertension). She denies chest pain, chest pressure, or headache. ? ?Assessment/Plan:  ? ?1. Hyperlipidemia associated with type 2 diabetes mellitus ([place]) ?Angel Watkins will continue atorvastatin, with no change in dose or medications.  ? ?2. Hypertension associated with diabetes ([place]) ?Angel Watkins will continue Avapro and Demadex.  ? ?3. Obesity with current BMI of 52.9 ?Angel Watkins is currently in the action stage of change. As such, her goal is to continue with weight loss efforts. She has agreed to the Category 3 Plan.  ? ?Exercise goals: No exercise has been prescribed at this time. ? ?Behavioral modification strategies: increasing lean protein intake, meal planning and cooking strategies, keeping healthy foods in the home, and planning for success. ? ?Angel Watkins has agreed to follow-up with our  clinic in 3 to 4 weeks. She was informed of the importance of frequent follow-up visits to maximize her success with intensive lifestyle modifications for her multiple health conditions.  ? ?Objective:  ? ?Blood pressure 109/71, pulse 71, temperature 97.6 ?F (36.4 ?C), height 5\' 4"  (1.626 m), weight (!) 308 lb (139.7 kg), SpO2 98 %. ?Body mass index is 52.87 kg/m?. ? ?General: Cooperative, alert, well developed, in no acute distress. ?HEENT: Conjunctivae and lids unremarkable. ?Cardiovascular: Regular rhythm.  ?Lungs: Normal work of breathing. ?Neurologic: No focal deficits.  ? ?Lab Results  ?Component Value Date  ? CREATININE 0.82 03/15/2021  ? BUN 19 03/15/2021  ? NA 143 03/15/2021  ? K 4.6 03/15/2021  ? CL 103 03/15/2021  ? CO2 25 03/15/2021  ? ?Lab Results  ?Component Value Date  ? ALT 26 03/15/2021  ? AST 23 03/15/2021  ? ALKPHOS 114 03/15/2021  ? BILITOT 0.5 03/15/2021  ? ?Lab Results  ?Component Value Date  ? HGBA1C 5.8 (H) 03/15/2021  ? HGBA1C 5.9 (H) 12/02/2020  ? HGBA1C 5.4 07/30/2020  ? HGBA1C 6.5 (H) 04/22/2020  ? HGBA1C 6.2 (H) 11/29/2018  ? ?Lab Results  ?Component Value Date  ? INSULIN 9.6 03/15/2021  ? INSULIN 11.1 12/02/2020  ? INSULIN 6.6 07/30/2020  ? INSULIN 10.8 04/22/2020  ? ?Lab Results  ?Component Value Date  ? TSH 1.670 04/22/2020  ? ?Lab Results  ?Component Value Date  ? CHOL 119 07/30/2020  ? HDL 63 07/30/2020  ? [place] 43 07/30/2020  ? TRIG 62 07/30/2020  ? ?Lab Results  ?Component Value Date  ?  VD25OH 47.9 03/15/2021  ? VD25OH 45.0 12/02/2020  ? VD25OH 77.9 07/30/2020  ? ?Lab Results  ?Component Value Date  ? WBC 7.4 04/22/2020  ? HGB 14.6 04/22/2020  ? HCT 43.1 04/22/2020  ? MCV 92 04/22/2020  ? PLT 351 04/22/2020  ? ?No results found for: IRON, TIBC, FERRITIN ? ?Attestation Statements:  ? ?Reviewed by clinician on day of visit: allergies, medications, problem list, medical history, surgical history, family history, social history, and previous encounter notes. ? ? ?I, Trixie Dredge,  am acting as transcriptionist for Coralie Common, MD. ? ?I have reviewed the above documentation for accuracy and completeness, and I agree with the above. Coralie Common, MD ? ? ?

## 2021-06-03 ENCOUNTER — Encounter (INDEPENDENT_AMBULATORY_CARE_PROVIDER_SITE_OTHER): Payer: Self-pay | Admitting: Family Medicine

## 2021-06-08 ENCOUNTER — Telehealth (INDEPENDENT_AMBULATORY_CARE_PROVIDER_SITE_OTHER): Payer: 59 | Admitting: Family Medicine

## 2021-06-08 DIAGNOSIS — Z7985 Long-term (current) use of injectable non-insulin antidiabetic drugs: Secondary | ICD-10-CM

## 2021-06-08 DIAGNOSIS — E1165 Type 2 diabetes mellitus with hyperglycemia: Secondary | ICD-10-CM | POA: Diagnosis not present

## 2021-06-08 DIAGNOSIS — E1169 Type 2 diabetes mellitus with other specified complication: Secondary | ICD-10-CM | POA: Diagnosis not present

## 2021-06-08 DIAGNOSIS — E669 Obesity, unspecified: Secondary | ICD-10-CM

## 2021-06-08 DIAGNOSIS — E785 Hyperlipidemia, unspecified: Secondary | ICD-10-CM

## 2021-06-08 DIAGNOSIS — Z6841 Body Mass Index (BMI) 40.0 and over, adult: Secondary | ICD-10-CM

## 2021-06-08 MED ORDER — OZEMPIC (1 MG/DOSE) 4 MG/3ML ~~LOC~~ SOPN: 1.0000 mg | PEN_INJECTOR | SUBCUTANEOUS | 0 refills | Status: DC

## 2021-06-14 ENCOUNTER — Encounter (INDEPENDENT_AMBULATORY_CARE_PROVIDER_SITE_OTHER): Payer: Self-pay | Admitting: Family Medicine

## 2021-06-15 NOTE — Progress Notes (Signed)
TeleHealth Visit:  Due to the COVID-19 pandemic, this visit was completed with telemedicine (audio/video) technology to reduce patient and provider exposure as well as to preserve personal protective equipment.   Angel Watkins has verbally consented to this TeleHealth visit. The patient is located at home, the provider is located at the [facility] office. The participants in this visit include the listed provider and patient. The visit was conducted today via Mychart video.   Chief Complaint: OBESITY Angel Watkins is here to discuss her progress with her obesity treatment plan along with follow-up of her obesity related diagnoses. Angel Watkins is on the Category 3 Plan and states she is following her eating plan approximately 50% of the time. Angel Watkins states she is exercising 0 minutes 0 times per week.  Today's visit was #: 18 Starting weight: 333 lbs Starting date: 04/22/2020  Interim History: Angel Watkins fell about 2 weeks ago and struggled with food choices after that. She sprained her wrist and this was painful for about a week and a half. Scale at home weighing 30+. She is going to [facility] for a week. She is doing well on Ozempic. Water intake increase 1-2 days after taking Ozempic. Wants to move up to Cat 3 in the next few weeks.   Subjective:   1. Type 2 diabetes mellitus with hyperglycemia, without long-term current use of insulin ([place]) Angel Watkins is currently on Ozempic without any GI symptoms. Last A1c at 5.8, insulin 9.6.  2. Hyperlipidemia associated with type 2 diabetes mellitus ([place]) Angel Watkins is currently on statin, without myalgias reported.  Assessment/Plan:   1. Type 2 diabetes mellitus with hyperglycemia, without long-term current use of insulin (HCC) We will refill Ozempic 1 mg subcutaneous, weekly, for 1 month with zero refills. No changein dosage as she will be increasing her food intake.  -Refill Semaglutide, 1 MG/DOSE, (OZEMPIC, 1 MG/DOSE,) 4 MG/3ML SOPN; Inject 1 mg into  the skin once a week.  Dispense: 3 mL; Refill: 0  2. Hyperlipidemia associated with type 2 diabetes mellitus ([place]) Angel Watkins will continue statin and have labs in July 2023.  3. Obesity with current BMI of 52.9 Angel Watkins is currently in the action stage of change. As such, her goal is to continue with weight loss efforts. She has agreed to the Category 3 Plan.   Exercise goals: None.  Behavioral modification strategies: increasing lean protein intake, meal planning and cooking strategies, travel eating strategies, and planning for success.  Angel Watkins has agreed to follow-up with our clinic in 3 weeks. She was informed of the importance of frequent follow-up visits to maximize her success with intensive lifestyle modifications for her multiple health conditions.  Objective:   VITALS: Per patient if applicable, see vitals. GENERAL: Alert and in no acute distress. CARDIOPULMONARY: No increased WOB. Speaking in clear sentences.  PSYCH: Pleasant and cooperative. Speech normal rate and rhythm. Affect is appropriate. Insight and judgement are appropriate. Attention is focused, linear, and appropriate.  NEURO: Oriented as arrived to appointment on time with no prompting.   Lab Results  Component Value Date   CREATININE 0.82 03/15/2021   BUN 19 03/15/2021   NA 143 03/15/2021   K 4.6 03/15/2021   CL 103 03/15/2021   CO2 25 03/15/2021   Lab Results  Component Value Date   ALT 26 03/15/2021   AST 23 03/15/2021   ALKPHOS 114 03/15/2021   BILITOT 0.5 03/15/2021   Lab Results  Component Value Date   HGBA1C 5.8 (H) 03/15/2021  HGBA1C 5.9 (H) 12/02/2020   HGBA1C 5.4 07/30/2020   HGBA1C 6.5 (H) 04/22/2020   HGBA1C 6.2 (H) 11/29/2018   Lab Results  Component Value Date   INSULIN 9.6 03/15/2021   INSULIN 11.1 12/02/2020   INSULIN 6.6 07/30/2020   INSULIN 10.8 04/22/2020   Lab Results  Component Value Date   TSH 1.670 04/22/2020   Lab Results  Component Value Date   CHOL 119 07/30/2020    HDL 63 07/30/2020   LDLCALC 43 07/30/2020   TRIG 62 07/30/2020   Lab Results  Component Value Date   VD25OH 47.9 03/15/2021   VD25OH 45.0 12/02/2020   VD25OH 77.9 07/30/2020   Lab Results  Component Value Date   WBC 7.4 04/22/2020   HGB 14.6 04/22/2020   HCT 43.1 04/22/2020   MCV 92 04/22/2020   PLT 351 04/22/2020   No results found for: IRON, TIBC, FERRITIN  Attestation Statements:   Reviewed by clinician on day of visit: allergies, medications, problem list, medical history, surgical history, family history, social history, and previous encounter notes.  I, Elnora Morrison, RMA am acting as transcriptionist for Coralie Common, MD.  I have reviewed the above documentation for accuracy and completeness, and I agree with the above. - Coralie Common, MD

## 2021-06-22 IMAGING — MR MR LUMBAR SPINE W/O CM
4 of 5 series · 18 of 48 positions shown · non-contrast
Comparison: MRI 12/14/2017

CLINICAL DATA: Low back and bilateral leg pain for several months.

EXAM:
MRI LUMBAR SPINE WITHOUT CONTRAST
TECHNIQUE: Multiplanar, multisequence MR imaging of the lumbar spine was
performed. No intravenous contrast was administered.

[Series 2: T2 · sagittal · 4.0mm · 0.55mm/px · 6 of 15 slices shown (1 of 2)]
[im 1/15]
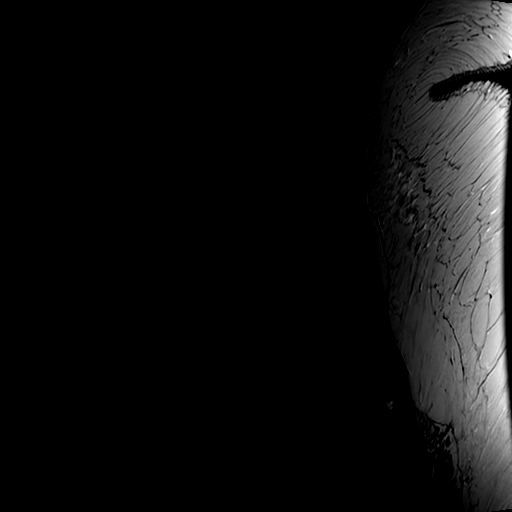
[im 3/15]
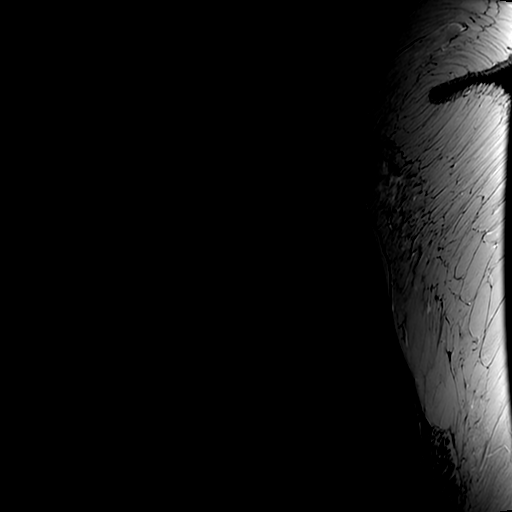
[im 6/15]
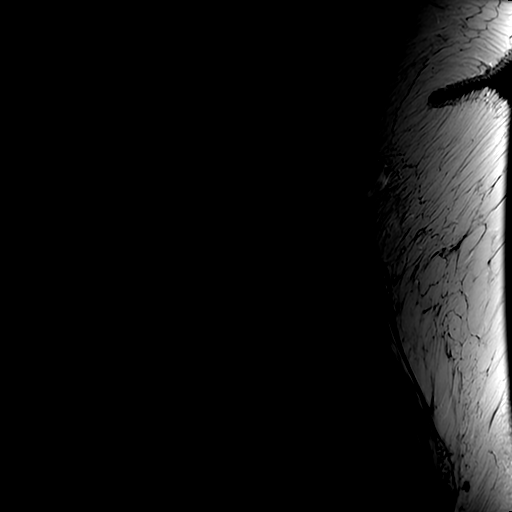
[im 9/15]
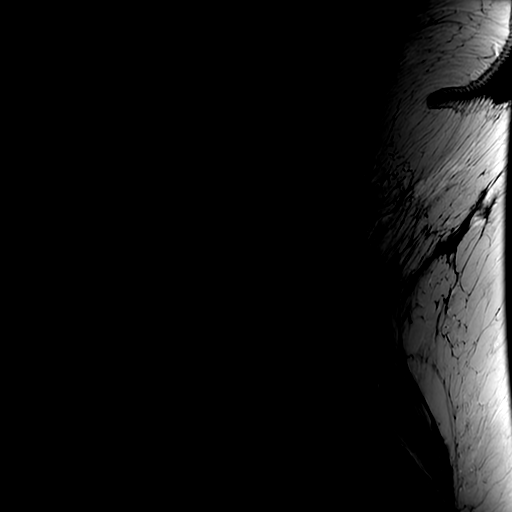
[im 12/15]
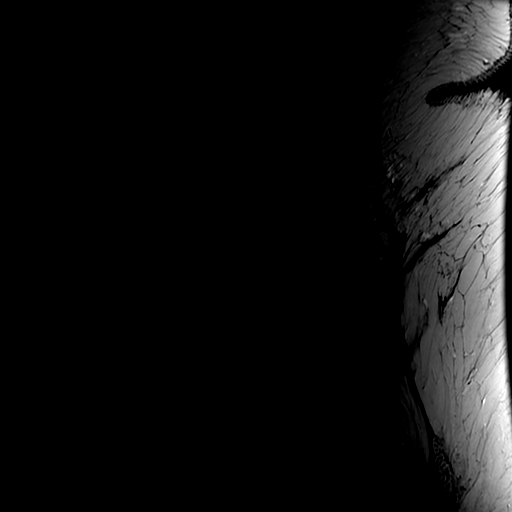
[im 15/15]
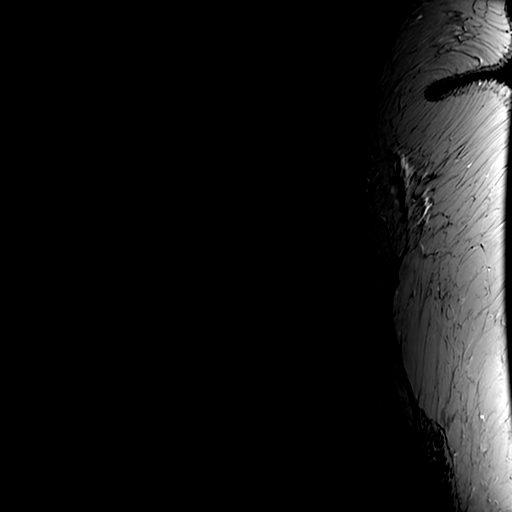

[Series 4: T2 · axial · 4.0mm · 0.39mm/px · z∈[-71,+87]mm · 6 of 32 slices shown (2 of 2)]
[im 1/32]
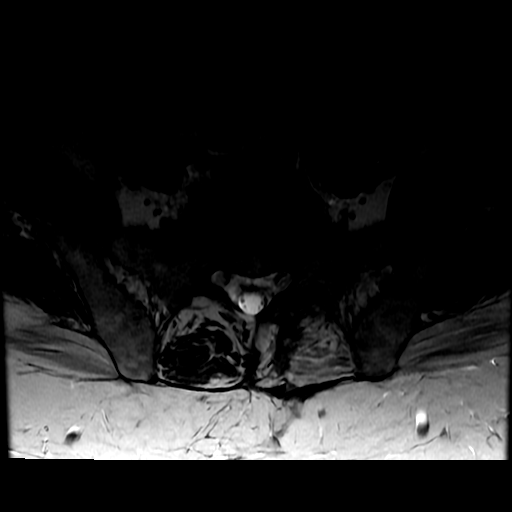
[im 5/32]
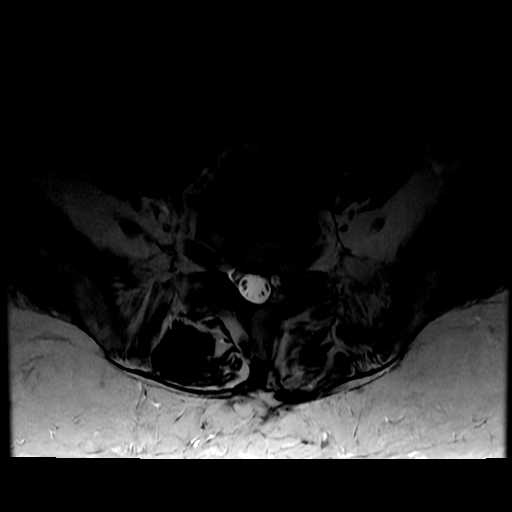
[im 10/32]
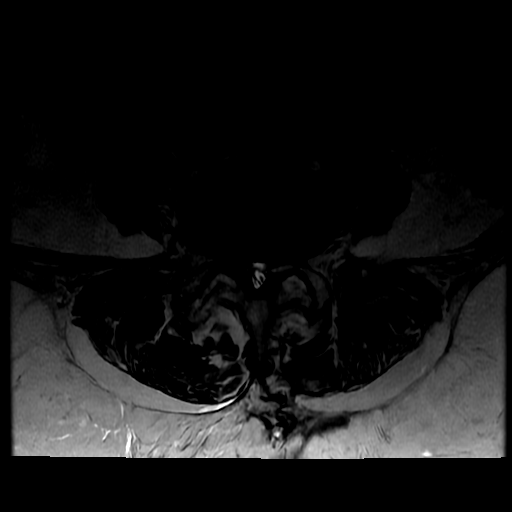
[im 15/32]
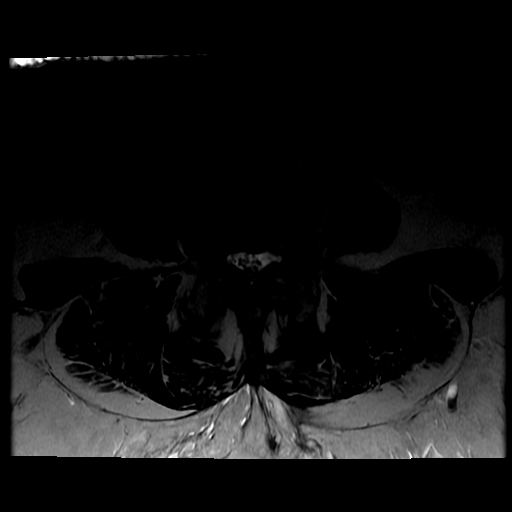
[im 17/32]
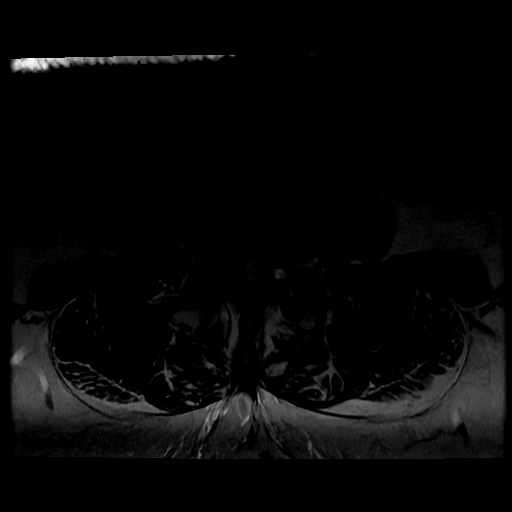
[im 27/32]
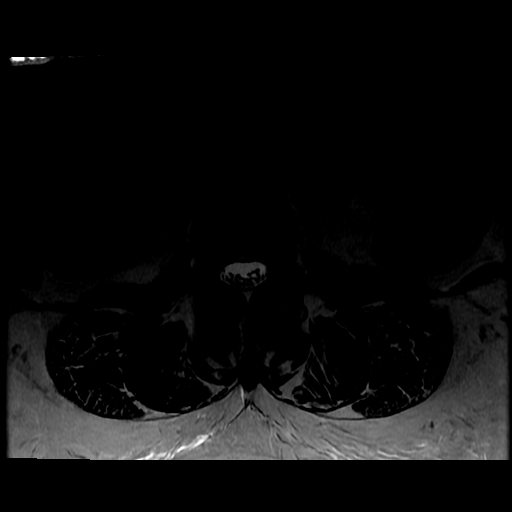

[Series 5: T1 · sagittal · 4.0mm · 0.55mm/px · 3 of 15 slices shown (1 of 2)]
[im 3/15]
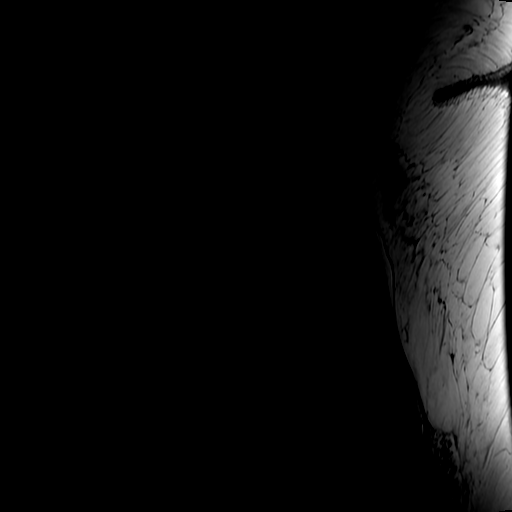
[im 8/15]
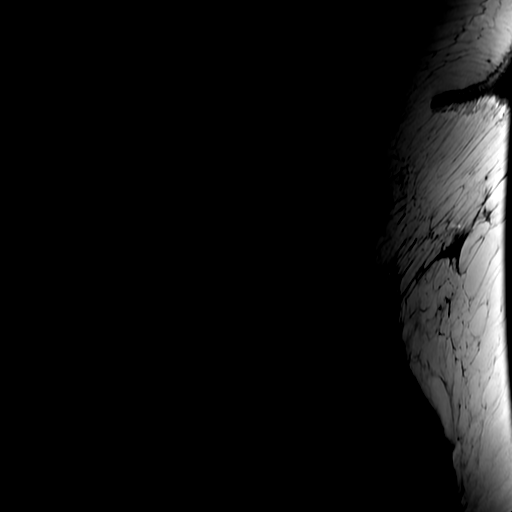
[im 12/15]
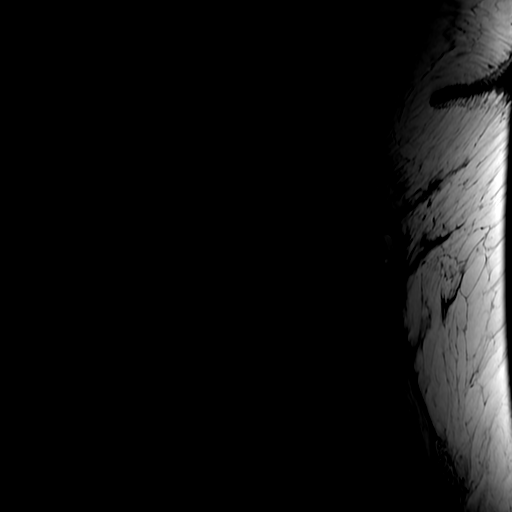

[Series 6: T1 · axial · 4.0mm · 0.39mm/px · z∈[-52,+87]mm · 3 of 32 slices shown (2 of 2)]
[im 5/32]
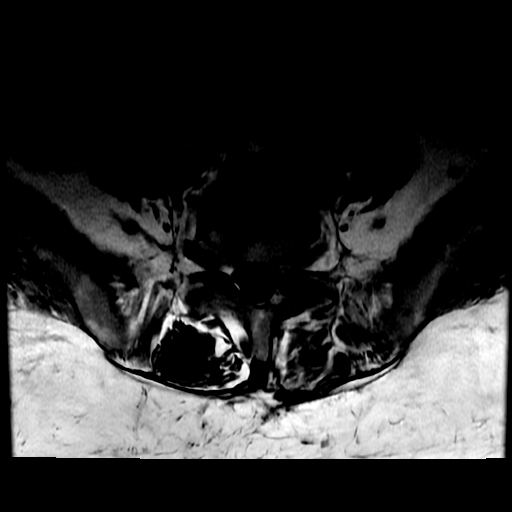
[im 17/32]
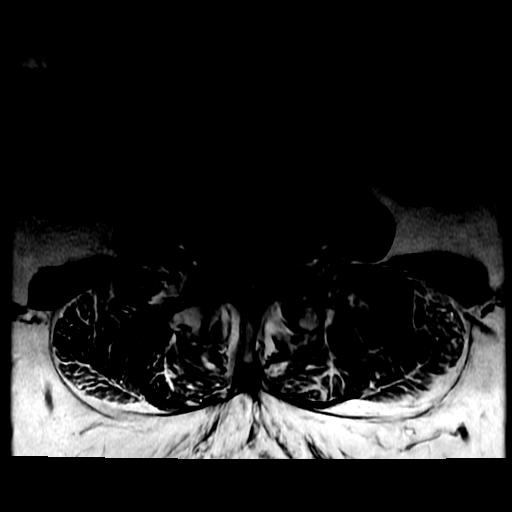
[im 27/32]
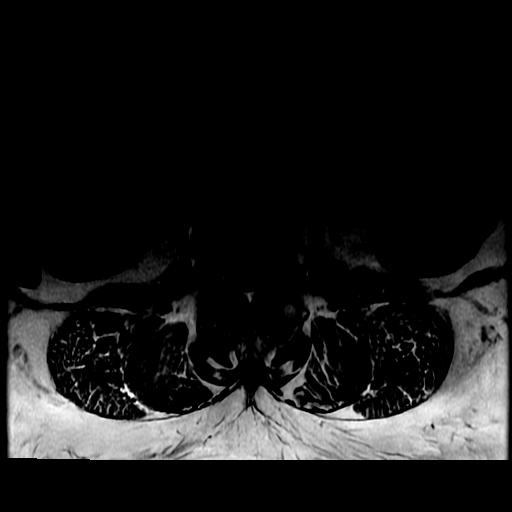

[18 of 48 positions shown; findings below may reference images not displayed]

FINDINGS: Segmentation: There are five lumbar type vertebral bodies. The last
full intervertebral disc space is labeled L5-S1. This correlates
with the prior study.

Alignment:  Normal

Vertebrae:  No bone lesions or fractures.

Conus medullaris and cauda equina: Conus extends to the L1 level.
Conus and cauda equina appear normal.

Paraspinal and other soft tissues: No significant paraspinal or
retroperitoneal findings are identified.

Disc levels:

L1-2: Mild annular bulge and moderate facet disease without
significant spinal or foraminal stenosis. Stable mild bilateral
lateral recess stenosis.

L2-3: Stable facet disease and mild bilateral lateral recess
stenosis. No significant spinal or foraminal stenosis.

L3-4: Bulging degenerated annulus and severe facet disease
contributes to stable severe spinal and bilateral lateral recess
stenosis. There is also a shallow left foraminal and extraforaminal
disc protrusion contributing to left foraminal stenosis which
appears progressive.

L4-5: Bulging degenerated annulus, central and left paracentral disc
protrusion, severe facet disease and ligamentum flavum thickening
contributing to severe spinal and bilateral lateral recess stenosis
which appears stable. There is also moderate bilateral foraminal
stenosis which is stable.

L5-S1: Remote left-sided laminectomy defect noted. Small central and
left paracentral disc protrusion and osteophytic spurring with mild
mass effect on the left side of the thecal sac. This appears
relatively stable. Slightly enlarged left S1 nerve root appears
stable.
IMPRESSION: 1. Stable degenerative lumbar spondylosis with multilevel disc
disease and facet disease.
2. Stable severe spinal and bilateral lateral recess stenosis at
L3-4. There is also a shallow left foraminal and extraforaminal disc
protrusion contributing to left foraminal stenosis which appears
progressive.
3. Stable severe spinal and bilateral lateral recess stenosis at
L4-5. There is also moderate bilateral foraminal stenosis.
4. Stable postoperative changes at L5-S1. Stable small central and
left paracentral disc protrusion and osteophytic spurring with mild
mass effect on the left side of the thecal sac. Slightly enlarged
left S1 nerve root appears stable.

## 2021-06-30 ENCOUNTER — Encounter (INDEPENDENT_AMBULATORY_CARE_PROVIDER_SITE_OTHER): Payer: Self-pay | Admitting: Family Medicine

## 2021-06-30 ENCOUNTER — Ambulatory Visit (INDEPENDENT_AMBULATORY_CARE_PROVIDER_SITE_OTHER): Payer: 59 | Admitting: Family Medicine

## 2021-06-30 VITALS — BP 103/69 | HR 79 | Temp 97.9°F | Ht 64.0 in | Wt 304.0 lb

## 2021-06-30 DIAGNOSIS — I152 Hypertension secondary to endocrine disorders: Secondary | ICD-10-CM | POA: Diagnosis not present

## 2021-06-30 DIAGNOSIS — Z6841 Body Mass Index (BMI) 40.0 and over, adult: Secondary | ICD-10-CM

## 2021-06-30 DIAGNOSIS — Z9189 Other specified personal risk factors, not elsewhere classified: Secondary | ICD-10-CM

## 2021-06-30 DIAGNOSIS — Z7985 Long-term (current) use of injectable non-insulin antidiabetic drugs: Secondary | ICD-10-CM

## 2021-06-30 DIAGNOSIS — E669 Obesity, unspecified: Secondary | ICD-10-CM | POA: Diagnosis not present

## 2021-06-30 DIAGNOSIS — E1159 Type 2 diabetes mellitus with other circulatory complications: Secondary | ICD-10-CM

## 2021-06-30 DIAGNOSIS — E1165 Type 2 diabetes mellitus with hyperglycemia: Secondary | ICD-10-CM

## 2021-06-30 MED ORDER — OZEMPIC (1 MG/DOSE) 4 MG/3ML ~~LOC~~ SOPN: 1.0000 mg | PEN_INJECTOR | SUBCUTANEOUS | 0 refills | Status: DC

## 2021-07-06 NOTE — Progress Notes (Signed)
Chief Complaint:   OBESITY Angel Watkins is here to discuss her progress with her obesity treatment plan along with follow-up of her obesity related diagnoses. Angel Watkins is on the Category 2 Plan and the Category 3 Plan and states she is following her eating plan approximately 85% of the time. Angel Watkins states she is doing a little walking.   Today's visit was #: 19 Starting weight: 333 lbs Starting date: 04/22/2020 Today's weight: 304 lbs Today's date: 06/30/2021 Total lbs lost to date: 29 lbs Total lbs lost since last in-office visit: 4 lbs  Interim History: Angel Watkins spoke with [facility] about revision of gastric bypass; appointment is June 14th. She went to [facility], [place] and was overall underwhelmed. She may want to have a stash of protein shakes for days, she does not feel like cooking. She is starting to feel bored with food options.   Subjective:   1. Type 2 diabetes mellitus with hyperglycemia, without long-term current use of insulin (HCC) Angel Watkins is currently on Ozempic 1 mg. Her A1c has improved on medicine.   2. Hypertension associated with diabetes (HCC) Angel Watkins is currently taking Avapro 75 mg. He blood pressure is well managed without dizziness/lightheadedness.  3. At risk for deficient intake of food The patient is at a higher than average risk of deficient intake of food due to skipping meals.  Assessment/Plan:   1. Type 2 diabetes mellitus with hyperglycemia, without long-term current use of insulin (HCC) We will refill Ozempic 1 mg subcutaneous once weekly for 1 month with 0 refills.   -Refill  Semaglutide, 1 MG/DOSE, (OZEMPIC, 1 MG/DOSE,) 4 MG/3ML SOPN; Inject 1 mg into the skin once a week.  Dispense: 3 mL; Refill: 0  2. Hypertension associated with diabetes (HCC) We will follow up with blood pressure at next appointment without changing dose but hopeful to be able to decrease dose at next appointment.  3. At risk for deficient intake of food Angel Watkins  was given approximately 15 minutes of deficient intake of food prevention counseling today. Angel Watkins is at risk for eating too few calories based on current food recall. She was encouraged to focus on meeting caloric and protein goals according to her recommended meal plan.  4. Obesity with current BMI of 52.3 Angel Watkins is currently in the action stage of change. As such, her goal is to continue with weight loss efforts. She has agreed to the Category 3 Plan.   Exercise goals: All adults should avoid inactivity. Some physical activity is better than none, and adults who participate in any amount of physical activity gain some health benefits.  Behavioral modification strategies: increasing lean protein intake, no skipping meals, meal planning and cooking strategies, keeping healthy foods in the home, and planning for success.  Angel Watkins has agreed to follow-up with our clinic in 4 weeks. She was informed of the importance of frequent follow-up visits to maximize her success with intensive lifestyle modifications for her multiple health conditions.   Objective:   Blood pressure 103/69, pulse 79, temperature 97.9 F (36.6 C), height 5\' 4"  (1.626 m), weight (!) 304 lb (137.9 kg), SpO2 99 %. Body mass index is 52.18 kg/m.  General: Cooperative, alert, well developed, in no acute distress. HEENT: Conjunctivae and lids unremarkable. Cardiovascular: Regular rhythm.  Lungs: Normal work of breathing. Neurologic: No focal deficits.   Lab Results  Component Value Date   CREATININE 0.82 03/15/2021   BUN 19 03/15/2021   NA 143 03/15/2021   K 4.6 03/15/2021  CL 103 03/15/2021   CO2 25 03/15/2021   Lab Results  Component Value Date   ALT 26 03/15/2021   AST 23 03/15/2021   ALKPHOS 114 03/15/2021   BILITOT 0.5 03/15/2021   Lab Results  Component Value Date   HGBA1C 5.8 (H) 03/15/2021   HGBA1C 5.9 (H) 12/02/2020   HGBA1C 5.4 07/30/2020   HGBA1C 6.5 (H) 04/22/2020   HGBA1C 6.2 (H) 11/29/2018    Lab Results  Component Value Date   INSULIN 9.6 03/15/2021   INSULIN 11.1 12/02/2020   INSULIN 6.6 07/30/2020   INSULIN 10.8 04/22/2020   Lab Results  Component Value Date   TSH 1.670 04/22/2020   Lab Results  Component Value Date   CHOL 119 07/30/2020   HDL 63 07/30/2020   LDLCALC 43 07/30/2020   TRIG 62 07/30/2020   Lab Results  Component Value Date   VD25OH 47.9 03/15/2021   VD25OH 45.0 12/02/2020   VD25OH 77.9 07/30/2020   Lab Results  Component Value Date   WBC 7.4 04/22/2020   HGB 14.6 04/22/2020   HCT 43.1 04/22/2020   MCV 92 04/22/2020   PLT 351 04/22/2020   No results found for: IRON, TIBC, FERRITIN  Attestation Statements:   Reviewed by clinician on day of visit: allergies, medications, problem list, medical history, surgical history, family history, social history, and previous encounter notes.  I, Fortino Sic, RMA am acting as transcriptionist for Reuben Likes, MD.  I have reviewed the above documentation for accuracy and completeness, and I agree with the above. - Reuben Likes, MD

## 2021-07-20 ENCOUNTER — Other Ambulatory Visit: Payer: Self-pay | Admitting: Surgery

## 2021-07-20 DIAGNOSIS — Z9884 Bariatric surgery status: Secondary | ICD-10-CM

## 2021-07-26 ENCOUNTER — Ambulatory Visit (INDEPENDENT_AMBULATORY_CARE_PROVIDER_SITE_OTHER): Payer: 59 | Admitting: Family Medicine

## 2021-07-26 ENCOUNTER — Encounter (INDEPENDENT_AMBULATORY_CARE_PROVIDER_SITE_OTHER): Payer: Self-pay | Admitting: Family Medicine

## 2021-07-26 VITALS — BP 107/73 | HR 78 | Temp 98.0°F | Ht 64.0 in | Wt 306.6 lb

## 2021-07-26 DIAGNOSIS — E669 Obesity, unspecified: Secondary | ICD-10-CM | POA: Diagnosis not present

## 2021-07-26 DIAGNOSIS — I152 Hypertension secondary to endocrine disorders: Secondary | ICD-10-CM | POA: Diagnosis not present

## 2021-07-26 DIAGNOSIS — Z9189 Other specified personal risk factors, not elsewhere classified: Secondary | ICD-10-CM

## 2021-07-26 DIAGNOSIS — Z6841 Body Mass Index (BMI) 40.0 and over, adult: Secondary | ICD-10-CM

## 2021-07-26 DIAGNOSIS — E1159 Type 2 diabetes mellitus with other circulatory complications: Secondary | ICD-10-CM | POA: Diagnosis not present

## 2021-07-26 DIAGNOSIS — Z7985 Long-term (current) use of injectable non-insulin antidiabetic drugs: Secondary | ICD-10-CM

## 2021-07-26 DIAGNOSIS — E1165 Type 2 diabetes mellitus with hyperglycemia: Secondary | ICD-10-CM

## 2021-07-26 MED ORDER — TIRZEPATIDE 5 MG/0.5ML ~~LOC~~ SOAJ: 5.0000 mg | SUBCUTANEOUS | 0 refills | Status: DC

## 2021-07-28 NOTE — Progress Notes (Signed)
Chief Complaint:   OBESITY Angel Watkins is here to discuss her progress with her obesity treatment plan along with follow-up of her obesity related diagnoses. [name] is on the Category 3 Plan and states she is following her eating plan approximately 75% of the time. Lavada states she is exercising 0 minutes 0 times per week.  Today's visit was #: 20 Starting weight: 333 lbs Starting date: 04/22/2020 Today's weight: 306 lbs Today's date: 07/26/2021 Total lbs lost to date: 27 lbs Total lbs lost since last in-office visit: 0  Interim History: [name]'s husband had surgery recently and she is not used to him relying on her. She is still dealing with her father's estate. She's very emotional today. She feels overwhelmed with demands from work as well. Wants to go back to original plan. 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  Subjective:   1. Type 2 diabetes mellitus with hyperglycemia, without long-term current use of insulin (HCC) [name] is currently on Ozempic but unfortunately has not seen much control with diabetes management.  2. Hypertension associated with diabetes (HCC) [name]'s blood pressure is very well controlled currently. Denies chest pain, chest pressure and headache.  3. At risk for side effect of medication [name] is at risk for drug side effects due to changing to [facility].  Assessment/Plan:   1. Type 2 diabetes mellitus with hyperglycemia, without long-term current use of insulin (HCC) Stop Ozempic and Start Mounjaro 5 mg SubQ once weekly for 1 month with 0 refills.  -Start/fill tirzepatide (MOUNJARO) 5 MG/0.5ML Pen; Inject 5 mg into the skin once a week.  Dispense: 2 mL; Refill: 0  2. Hypertension associated with diabetes [facility]) [name] will continue taking current medications with no change in dose.  3. At risk for side effect of medication [name] was given approximately 15 minutes of drug side effect counseling today.   We discussed side effect possibility and risk versus benefits. Shantoria agreed to the medication and will contact this office if these side effects are intolerable.  Repetitive spaced learning was employed today to elicit superior memory formation and behavioral change.  4. Obesity with current BMI of 52.6 [name] is currently in the action stage of change. As such, her goal is to continue with weight loss efforts. She has agreed to the Category 2 Plan +100.  Exercise goals: No exercise has been prescribed at this time.  Behavioral modification strategies: increasing lean protein intake, meal planning and cooking strategies, and keeping healthy foods in the home.  [name] has agreed to follow-up with our clinic in 3 weeks. She was informed of the importance of frequent follow-up visits to maximize her success with intensive lifestyle modifications for her multiple health conditions.   Objective:   Blood pressure 107/73, pulse 78, temperature 98 F (36.7 C), height 5\' 4"  (1.626 m), weight (!) 306 lb 9.6 oz (139.1 kg), SpO2 97 %. Body mass index is 52.63 kg/m.  General: Cooperative, alert, well developed, in no acute distress. HEENT: Conjunctivae and lids unremarkable. Cardiovascular: Regular rhythm.  Lungs: Normal work of breathing. Neurologic: No focal deficits.   Lab Results  Component Value Date   CREATININE 0.82 03/15/2021   BUN 19 03/15/2021   NA 143 03/15/2021   K 4.6 03/15/2021   CL 103 03/15/2021   CO2 25 03/15/2021   Lab Results  Component Value Date   ALT 26 03/15/2021   AST 23 03/15/2021   ALKPHOS 114 03/15/2021   BILITOT  0.5 03/15/2021   Lab Results  Component Value Date   HGBA1C 5.8 (H) 03/15/2021   HGBA1C 5.9 (H) 12/02/2020   HGBA1C 5.4 07/30/2020   HGBA1C 6.5 (H) 04/22/2020   HGBA1C 6.2 (H) 11/29/2018   Lab Results  Component Value Date   INSULIN 9.6 03/15/2021   INSULIN 11.1 12/02/2020   INSULIN 6.6 07/30/2020   INSULIN 10.8 04/22/2020   Lab Results   Component Value Date   TSH 1.670 04/22/2020   Lab Results  Component Value Date   CHOL 119 07/30/2020   HDL 63 07/30/2020   LDLCALC 43 07/30/2020   TRIG 62 07/30/2020   Lab Results  Component Value Date   VD25OH 47.9 03/15/2021   VD25OH 45.0 12/02/2020   VD25OH 77.9 07/30/2020   Lab Results  Component Value Date   WBC 7.4 04/22/2020   HGB 14.6 04/22/2020   HCT 43.1 04/22/2020   MCV 92 04/22/2020   PLT 351 04/22/2020   No results found for: "IRON", "TIBC", "FERRITIN"  Attestation Statements:   Reviewed by clinician on day of visit: allergies, medications, problem list, medical history, surgical history, family history, social history, and previous encounter notes.  I, Fortino Sic, RMA am acting as transcriptionist for Reuben Likes, MD.  I have reviewed the above documentation for accuracy and completeness, and I agree with the above. - Reuben Likes, MD

## 2021-07-29 ENCOUNTER — Other Ambulatory Visit: Payer: Self-pay | Admitting: Surgery

## 2021-07-29 ENCOUNTER — Ambulatory Visit
Admission: RE | Admit: 2021-07-29 | Discharge: 2021-07-29 | Disposition: A | Discharge status: Home / Self Care | Payer: 59 | Source: Ambulatory Visit | Attending: Surgery | Admitting: Surgery

## 2021-07-29 DIAGNOSIS — Z9884 Bariatric surgery status: Secondary | ICD-10-CM

## 2021-08-18 ENCOUNTER — Ambulatory Visit (INDEPENDENT_AMBULATORY_CARE_PROVIDER_SITE_OTHER): Payer: 59 | Admitting: Family Medicine

## 2021-08-18 ENCOUNTER — Encounter (INDEPENDENT_AMBULATORY_CARE_PROVIDER_SITE_OTHER): Payer: Self-pay | Admitting: Family Medicine

## 2021-08-18 VITALS — BP 108/74 | HR 79 | Temp 97.8°F | Ht 64.0 in | Wt 301.0 lb

## 2021-08-18 DIAGNOSIS — R600 Localized edema: Secondary | ICD-10-CM

## 2021-08-18 DIAGNOSIS — E669 Obesity, unspecified: Secondary | ICD-10-CM

## 2021-08-18 DIAGNOSIS — Z6841 Body Mass Index (BMI) 40.0 and over, adult: Secondary | ICD-10-CM | POA: Diagnosis not present

## 2021-08-18 DIAGNOSIS — E1165 Type 2 diabetes mellitus with hyperglycemia: Secondary | ICD-10-CM | POA: Diagnosis not present

## 2021-08-18 DIAGNOSIS — Z7985 Long-term (current) use of injectable non-insulin antidiabetic drugs: Secondary | ICD-10-CM

## 2021-08-18 MED ORDER — TIRZEPATIDE 5 MG/0.5ML ~~LOC~~ SOAJ: 5.0000 mg | SUBCUTANEOUS | 0 refills | Status: DC

## 2021-08-19 NOTE — Progress Notes (Signed)
Chief Complaint:   OBESITY Angel Watkins is here to discuss her progress with her obesity treatment plan along with follow-up of her obesity related diagnoses. Angel Watkins is on the Category 2 Plan +100 and states she is following her eating plan approximately 85% of the time. Angel Watkins states she is exercising 0 minutes 0 times per week.  Today's visit was #: 21 Starting weight: 333 lbs Starting date: 04/22/2020 Today's weight: 301 lbs Today's date: 08/18/2021 Total lbs lost to date: 32 lbs Total lbs lost since last in-office visit: 5  Interim History: Angel Watkins has been mostly managing her responsibilities at home caring for her sick husband. She starts work again Monday. She has tired to stay as much as she can on plan. Cat 2 plan works better and is easier at work. She is looking forward to structure and routine of work.  Subjective:   1. Edema of lower extremity, left Angel Watkins on Torsemide with good control of edema. She has seen cardiology in the past.  2. Type 2 diabetes mellitus with hyperglycemia, without long-term current use of insulin (HCC) Angel Watkins is currently on [facility] with good control of carb intake without GI side effects.  Assessment/Plan:   1. Edema of lower extremity, left Angel Watkins will continue Torsemide.  2. Type 2 diabetes mellitus with hyperglycemia, without long-term current use of insulin (HCC) We will refill Mounjaro 5 mg SubQ once weekly for 1 month with 0 refills.  -Refill tirzepatide (MOUNJARO) 5 MG/0.5ML Pen; Inject 5 mg into the skin once a week.  Dispense: 2 mL; Refill: 0  3. Obesity with current BMI of 51.7 Angel Watkins is currently in the action stage of change. As such, her goal is to continue with weight loss efforts. She has agreed to the Category 2 Plan +100.  Exercise goals: No exercise has been prescribed at this time.  Behavioral modification strategies: increasing lean protein intake, meal planning and cooking strategies, keeping healthy foods in the home,  and planning for success.  Angel Watkins has agreed to follow-up with our clinic in 4 weeks. She was informed of the importance of frequent follow-up visits to maximize her success with intensive lifestyle modifications for her multiple health conditions.   Objective:   Blood pressure 108/74, pulse 79, temperature 97.8 F (36.6 C), height 5\' 4"  (1.626 m), weight (!) 301 lb (136.5 kg), SpO2 96 %. Body mass index is 51.67 kg/m.  General: Cooperative, alert, well developed, in no acute distress. HEENT: Conjunctivae and lids unremarkable. Cardiovascular: Regular rhythm.  Lungs: Normal work of breathing. Neurologic: No focal deficits.   Lab Results  Component Value Date   CREATININE 0.82 03/15/2021   BUN 19 03/15/2021   NA 143 03/15/2021   K 4.6 03/15/2021   CL 103 03/15/2021   CO2 25 03/15/2021   Lab Results  Component Value Date   ALT 26 03/15/2021   AST 23 03/15/2021   ALKPHOS 114 03/15/2021   BILITOT 0.5 03/15/2021   Lab Results  Component Value Date   HGBA1C 5.8 (H) 03/15/2021   HGBA1C 5.9 (H) 12/02/2020   HGBA1C 5.4 07/30/2020   HGBA1C 6.5 (H) 04/22/2020   HGBA1C 6.2 (H) 11/29/2018   Lab Results  Component Value Date   INSULIN 9.6 03/15/2021   INSULIN 11.1 12/02/2020   INSULIN 6.6 07/30/2020   INSULIN 10.8 04/22/2020   Lab Results  Component Value Date   TSH 1.670 04/22/2020   Lab Results  Component Value Date   CHOL 119 07/30/2020   HDL  63 07/30/2020   LDLCALC 43 07/30/2020   TRIG 62 07/30/2020   Lab Results  Component Value Date   VD25OH 47.9 03/15/2021   VD25OH 45.0 12/02/2020   VD25OH 77.9 07/30/2020   Lab Results  Component Value Date   WBC 7.4 04/22/2020   HGB 14.6 04/22/2020   HCT 43.1 04/22/2020   MCV 92 04/22/2020   PLT 351 04/22/2020   No results found for: "IRON", "TIBC", "FERRITIN"  Attestation Statements:   Reviewed by clinician on day of visit: allergies, medications, problem list, medical history, surgical history, family history,  social history, and previous encounter notes.  I, Fortino Sic, RMA am acting as transcriptionist for Reuben Likes, MD.  I have reviewed the above documentation for accuracy and completeness, and I agree with the above. - Reuben Likes, MD

## 2021-09-08 ENCOUNTER — Encounter (INDEPENDENT_AMBULATORY_CARE_PROVIDER_SITE_OTHER): Payer: Self-pay

## 2021-09-15 ENCOUNTER — Ambulatory Visit (INDEPENDENT_AMBULATORY_CARE_PROVIDER_SITE_OTHER): Payer: 59 | Admitting: Family Medicine

## 2021-09-15 ENCOUNTER — Encounter (INDEPENDENT_AMBULATORY_CARE_PROVIDER_SITE_OTHER): Payer: Self-pay | Admitting: Family Medicine

## 2021-09-15 VITALS — BP 109/74 | HR 70 | Temp 98.2°F | Ht 64.0 in | Wt 298.0 lb

## 2021-09-15 DIAGNOSIS — E1159 Type 2 diabetes mellitus with other circulatory complications: Secondary | ICD-10-CM

## 2021-09-15 DIAGNOSIS — Z7985 Long-term (current) use of injectable non-insulin antidiabetic drugs: Secondary | ICD-10-CM

## 2021-09-15 DIAGNOSIS — Z6841 Body Mass Index (BMI) 40.0 and over, adult: Secondary | ICD-10-CM

## 2021-09-15 DIAGNOSIS — E1165 Type 2 diabetes mellitus with hyperglycemia: Secondary | ICD-10-CM | POA: Diagnosis not present

## 2021-09-15 DIAGNOSIS — I152 Hypertension secondary to endocrine disorders: Secondary | ICD-10-CM | POA: Diagnosis not present

## 2021-09-15 DIAGNOSIS — E669 Obesity, unspecified: Secondary | ICD-10-CM

## 2021-09-15 MED ORDER — TIRZEPATIDE 5 MG/0.5ML ~~LOC~~ SOAJ: 5.0000 mg | SUBCUTANEOUS | 0 refills | Status: DC

## 2021-09-17 HISTORY — PX: LUMBAR LAMINECTOMY: SHX95

## 2021-09-24 NOTE — Progress Notes (Unsigned)
Chief Complaint:   OBESITY Angel Watkins is here to discuss her progress with her obesity treatment plan along with follow-up of her obesity related diagnoses. Angel Watkins is on the Category 2 Plan+ 100 and states she is following her eating plan approximately 100% of the time. Angel Watkins states she is exercising 0 minutes 0 times per week.  Today's visit was #: 22 Starting weight: 333 lbs Starting date: 04/22/2020 Today's weight: 298 lbs Today's date: 09/15/2021 Total lbs lost to date: 35 lbs Total lbs lost since last in-office visit: 3  Interim History: Over the last month Angel Watkins has stayed dedicated to meal plan. She has drank more water than anything else. She added in more fruit recently. She did not find it difficult to adhere to meal plan. She hs taken bread out of plan. If breakfast is later she often does not need mid morning snack.  Subjective:   1. Type 2 diabetes mellitus with hyperglycemia, without long-term current use of insulin (HCC) Angel Watkins is currently on Mounjaro 5 mg. Denies GI side effects. Her last A1c was 5.8.  2. Hypertension associated with diabetes (HCC) Angel Watkins's blood pressure is well controlled today. Denies chest pain, chest pressure and headache, dizziness, lightheadedness.  Assessment/Plan:   1. Type 2 diabetes mellitus with hyperglycemia, without long-term current use of insulin (HCC) We will refill Mounjaro 5 mg SubQ once weekly for 1 month with 0 refills.  -Refill tirzepatide (MOUNJARO) 5 MG/0.5ML Pen; Inject 5 mg into the skin once a week.  Dispense: 2 mL; Refill: 0  2. Hypertension associated with diabetes (HCC) Continue current medications without changes in dose.  3. Obesity with current BMI of 51.2 Angel Watkins is currently in the action stage of change. As such, her goal is to continue with weight loss efforts. She has agreed to the Category 3 Plan +100.  Patient is to make substitutions for bread in plan.  Exercise goals: All adults should avoid  inactivity. Some physical activity is better than none, and adults who participate in any amount of physical activity gain some health benefits.  Behavioral modification strategies: increasing lean protein intake, meal planning and cooking strategies, keeping healthy foods in the home, and planning for success.  Angel Watkins has agreed to follow-up with our clinic in 4 weeks. She was informed of the importance of frequent follow-up visits to maximize her success with intensive lifestyle modifications for her multiple health conditions.   Objective:   Blood pressure 109/74, pulse 70, temperature 98.2 F (36.8 C), height 5\' 4"  (1.626 m), weight 298 lb (135.2 kg), SpO2 97 %. Body mass index is 51.15 kg/m.  General: Cooperative, alert, well developed, in no acute distress. HEENT: Conjunctivae and lids unremarkable. Cardiovascular: Regular rhythm.  Lungs: Normal work of breathing. Neurologic: No focal deficits.   Lab Results  Component Value Date   CREATININE 0.82 03/15/2021   BUN 19 03/15/2021   NA 143 03/15/2021   K 4.6 03/15/2021   CL 103 03/15/2021   CO2 25 03/15/2021   Lab Results  Component Value Date   ALT 26 03/15/2021   AST 23 03/15/2021   ALKPHOS 114 03/15/2021   BILITOT 0.5 03/15/2021   Lab Results  Component Value Date   HGBA1C 5.8 (H) 03/15/2021   HGBA1C 5.9 (H) 12/02/2020   HGBA1C 5.4 07/30/2020   HGBA1C 6.5 (H) 04/22/2020   HGBA1C 6.2 (H) 11/29/2018   Lab Results  Component Value Date   INSULIN 9.6 03/15/2021   INSULIN 11.1 12/02/2020   INSULIN  6.6 07/30/2020   INSULIN 10.8 04/22/2020   Lab Results  Component Value Date   TSH 1.670 04/22/2020   Lab Results  Component Value Date   CHOL 119 07/30/2020   HDL 63 07/30/2020   LDLCALC 43 07/30/2020   TRIG 62 07/30/2020   Lab Results  Component Value Date   VD25OH 47.9 03/15/2021   VD25OH 45.0 12/02/2020   VD25OH 77.9 07/30/2020   Lab Results  Component Value Date   WBC 7.4 04/22/2020   HGB 14.6  04/22/2020   HCT 43.1 04/22/2020   MCV 92 04/22/2020   PLT 351 04/22/2020   No results found for: "IRON", "TIBC", "FERRITIN"  Attestation Statements:   Reviewed by clinician on day of visit: allergies, medications, problem list, medical history, surgical history, family history, social history, and previous encounter notes.  I, Fortino Sic, RMA am acting as transcriptionist for Reuben Likes, MD.  I have reviewed the above documentation for accuracy and completeness, and I agree with the above. - Reuben Likes, MD

## 2021-10-10 ENCOUNTER — Other Ambulatory Visit (INDEPENDENT_AMBULATORY_CARE_PROVIDER_SITE_OTHER): Payer: Self-pay | Admitting: Family Medicine

## 2021-10-10 DIAGNOSIS — E1165 Type 2 diabetes mellitus with hyperglycemia: Secondary | ICD-10-CM

## 2021-10-13 ENCOUNTER — Encounter (INDEPENDENT_AMBULATORY_CARE_PROVIDER_SITE_OTHER): Payer: Self-pay | Admitting: Family Medicine

## 2021-10-13 ENCOUNTER — Ambulatory Visit (INDEPENDENT_AMBULATORY_CARE_PROVIDER_SITE_OTHER): Payer: 59 | Admitting: Family Medicine

## 2021-10-13 VITALS — BP 116/75 | HR 86 | Temp 97.9°F | Ht 64.0 in | Wt 299.0 lb

## 2021-10-13 DIAGNOSIS — E559 Vitamin D deficiency, unspecified: Secondary | ICD-10-CM

## 2021-10-13 DIAGNOSIS — E669 Obesity, unspecified: Secondary | ICD-10-CM | POA: Diagnosis not present

## 2021-10-13 DIAGNOSIS — Z6841 Body Mass Index (BMI) 40.0 and over, adult: Secondary | ICD-10-CM

## 2021-10-13 DIAGNOSIS — E1165 Type 2 diabetes mellitus with hyperglycemia: Secondary | ICD-10-CM

## 2021-10-13 DIAGNOSIS — Z7985 Long-term (current) use of injectable non-insulin antidiabetic drugs: Secondary | ICD-10-CM

## 2021-10-13 MED ORDER — TIRZEPATIDE 5 MG/0.5ML ~~LOC~~ SOAJ: 5.0000 mg | SUBCUTANEOUS | 0 refills | Status: DC

## 2021-10-16 NOTE — Progress Notes (Unsigned)
Chief Complaint:   OBESITY Angel Watkins is here to discuss her progress with her obesity treatment plan along with follow-up of her obesity related diagnoses. Angel Watkins is on the Category 2 Plan and states she is following her eating plan approximately 80% of the time. [name] states she is not exercising.  Today's visit was #: 23 Starting weight: 333 lbs Starting date: 04/22/2020 Today's weight: 299 lbs Today's date: 10/13/2021 Total lbs lost to date: 34 Total lbs lost since last in-office visit: 0  Interim History: Since Angel Watkins's last visit, she recognizes that she has skipped a significant number of meals, particularly supper. She is often not hungry at supper time. Angel Watkins realizes that she doesn't miss other meals. Angel Watkins is just having her husband's 67th birthday party coming up, otherwise, no events or travel.  Subjective:   1. Vitamin D deficiency Ellie's last vitamin D level was 55.0 She is currently taking OTC vitamin D 5,000IU each day. She noted fatigue and denies nausea, vomiting, or muscle weakness.  2. Type 2 diabetes mellitus with hyperglycemia, without long-term current use of insulin (HCC) Hani's A1c improved from 5.8 to 5.6 and she is on Mounjaro 5mg . No GI side effects are noted.  Assessment/Plan:   1. Vitamin D deficiency Angel Watkins agrees to continue taking OTC vitamin D with no change in dose. She agrees to follow up as directed.  2. Type 2 diabetes mellitus with hyperglycemia, without long-term current use of insulin (HCC) Angel Watkins agrees to continue taking Mounjaro 5mg  SubQ weekly and will follow up as agreed.   - tirzepatide [facility]) 5 MG/0.5ML Pen; Inject 5 mg into the skin once a week.  Dispense: 2 mL; Refill: 0  3. Obesity with current BMI of 51.3 Angel Watkins is currently in the action stage of change. As such, her goal is to continue with weight loss efforts. She has agreed to the Category 3 Plan + 100 calories.   Exercise goals: All adults should avoid  inactivity. Some physical activity is better than none, and adults who participate in any amount of physical activity gain some health benefits.  Behavioral modification strategies: increasing lean protein intake, no skipping meals, meal planning and cooking strategies, keeping healthy foods in the home, and planning for success.  Angel Watkins has agreed to follow-up with our clinic in 4 weeks. She was informed of the importance of frequent follow-up visits to maximize her success with intensive lifestyle modifications for her multiple health conditions.   Objective:   Blood pressure 116/75, pulse 86, temperature 97.9 F (36.6 C), height 5\' 4"  (1.626 m), weight 299 lb (135.6 kg), SpO2 99 %. Body mass index is 51.32 kg/m.  General: Cooperative, alert, well developed, in no acute distress. HEENT: Conjunctivae and lids unremarkable. Cardiovascular: Regular rhythm.  Lungs: Normal work of breathing. Neurologic: No focal deficits.   Lab Results  Component Value Date   CREATININE 0.82 03/15/2021   BUN 19 03/15/2021   NA 143 03/15/2021   K 4.6 03/15/2021   CL 103 03/15/2021   CO2 25 03/15/2021   Lab Results  Component Value Date   ALT 26 03/15/2021   AST 23 03/15/2021   ALKPHOS 114 03/15/2021   BILITOT 0.5 03/15/2021   Lab Results  Component Value Date   HGBA1C 5.8 (H) 03/15/2021   HGBA1C 5.9 (H) 12/02/2020   HGBA1C 5.4 07/30/2020   HGBA1C 6.5 (H) 04/22/2020   HGBA1C 6.2 (H) 11/29/2018   Lab Results  Component Value Date   INSULIN 9.6 03/15/2021  INSULIN 11.1 12/02/2020   INSULIN 6.6 07/30/2020   INSULIN 10.8 04/22/2020   Lab Results  Component Value Date   TSH 1.670 04/22/2020   Lab Results  Component Value Date   CHOL 119 07/30/2020   HDL 63 07/30/2020   LDLCALC 43 07/30/2020   TRIG 62 07/30/2020   Lab Results  Component Value Date   VD25OH 47.9 03/15/2021   VD25OH 45.0 12/02/2020   VD25OH 77.9 07/30/2020   Lab Results  Component Value Date   WBC 7.4  04/22/2020   HGB 14.6 04/22/2020   HCT 43.1 04/22/2020   MCV 92 04/22/2020   PLT 351 04/22/2020   No results found for: "IRON", "TIBC", "FERRITIN"  Attestation Statements:   Reviewed by clinician on day of visit: allergies, medications, problem list, medical history, surgical history, family history, social history, and previous encounter notes.  IMarcille Blanco, CMA, am acting as transcriptionist for Coralie Common, MD  I have reviewed the above documentation for accuracy and completeness, and I agree with the above. - Coralie Common, MD

## 2021-11-11 ENCOUNTER — Encounter (INDEPENDENT_AMBULATORY_CARE_PROVIDER_SITE_OTHER): Payer: Self-pay | Admitting: Family Medicine

## 2021-11-11 ENCOUNTER — Ambulatory Visit (INDEPENDENT_AMBULATORY_CARE_PROVIDER_SITE_OTHER): Payer: 59 | Admitting: Family Medicine

## 2021-11-11 VITALS — BP 110/72 | HR 66 | Temp 97.9°F | Ht 64.0 in | Wt 294.0 lb

## 2021-11-11 DIAGNOSIS — E1165 Type 2 diabetes mellitus with hyperglycemia: Secondary | ICD-10-CM

## 2021-11-11 DIAGNOSIS — Z7985 Long-term (current) use of injectable non-insulin antidiabetic drugs: Secondary | ICD-10-CM

## 2021-11-11 DIAGNOSIS — E669 Obesity, unspecified: Secondary | ICD-10-CM

## 2021-11-11 DIAGNOSIS — E559 Vitamin D deficiency, unspecified: Secondary | ICD-10-CM

## 2021-11-11 DIAGNOSIS — Z6841 Body Mass Index (BMI) 40.0 and over, adult: Secondary | ICD-10-CM

## 2021-11-11 MED ORDER — TIRZEPATIDE 5 MG/0.5ML ~~LOC~~ SOAJ: 5.0000 mg | SUBCUTANEOUS | 0 refills | Status: DC

## 2021-11-17 NOTE — Progress Notes (Signed)
Chief Complaint:   OBESITY Angel Watkins is here to discuss her progress with her obesity treatment plan along with follow-up of her obesity related diagnoses. Angel Watkins is on the Category 3 Plan and states she is following her eating plan approximately 70% of the time. Angel Watkins states she is exercising 0 minutes 0 times per week.  Today's visit was #: 24 Starting weight: 333 lbs Starting date: 04/22/2020 Today's weight: 294 lbs Today's date: 11/11/2021 Total lbs lost to date: 39 lbs Total lbs lost since last in-office visit: 5  Interim History: Angel Watkins celebrated husband's 65th birthday with a fantastic birthday party. Went to [employer] on her birthday. Felt she had done poorly on plan but only ate off plan those 2 days. No upcoming plans for birthdays.  Subjective:   1. Type 2 diabetes mellitus with hyperglycemia, without long-term current use of insulin (HCC) Last A1c 5.8, insulin 9.6. On Mounjaro with better control of indulgent eating but some increase in dry mouth.  2. Vitamin D deficiency Angel Watkins is not on prescription Vit D. She notes fatigue.  Assessment/Plan:   1. Type 2 diabetes mellitus with hyperglycemia, without long-term current use of insulin (HCC) We will refill Mounjaro 5 mg SubQ once weekly for 1 month with 0 refills.  -Refill tirzepatide (MOUNJARO) 5 MG/0.5ML Pen; Inject 5 mg into the skin once a week.  Dispense: 2 mL; Refill: 0  2. Vitamin D deficiency Continue over the counter without changes in dose.  3. Obesity with current BMI of 50.5 Angel Watkins is currently in the action stage of change. As such, her goal is to continue with weight loss efforts. She has agreed to the Category 3 Plan +100.  Exercise goals: All adults should avoid inactivity. Some physical activity is better than none, and adults who participate in any amount of physical activity gain some health benefits.  Angel Watkins is to try to get to pool 2-3x's a week.  Behavioral modification strategies:  increasing lean protein intake, meal planning and cooking strategies, keeping healthy foods in the home, and planning for success.  Angel Watkins has agreed to follow-up with our clinic in 5 weeks. She was informed of the importance of frequent follow-up visits to maximize her success with intensive lifestyle modifications for her multiple health conditions.   Objective:   Blood pressure 110/72, pulse 66, temperature 97.9 F (36.6 C), height 5\' 4"  (1.626 m), weight 294 lb (133.4 kg), SpO2 100 %. Body mass index is 50.46 kg/m.  General: Cooperative, alert, well developed, in no acute distress. HEENT: Conjunctivae and lids unremarkable. Cardiovascular: Regular rhythm.  Lungs: Normal work of breathing. Neurologic: No focal deficits.   Lab Results  Component Value Date   CREATININE 0.82 03/15/2021   BUN 19 03/15/2021   NA 143 03/15/2021   K 4.6 03/15/2021   CL 103 03/15/2021   CO2 25 03/15/2021   Lab Results  Component Value Date   ALT 26 03/15/2021   AST 23 03/15/2021   ALKPHOS 114 03/15/2021   BILITOT 0.5 03/15/2021   Lab Results  Component Value Date   HGBA1C 5.8 (H) 03/15/2021   HGBA1C 5.9 (H) 12/02/2020   HGBA1C 5.4 07/30/2020   HGBA1C 6.5 (H) 04/22/2020   HGBA1C 6.2 (H) 11/29/2018   Lab Results  Component Value Date   INSULIN 9.6 03/15/2021   INSULIN 11.1 12/02/2020   INSULIN 6.6 07/30/2020   INSULIN 10.8 04/22/2020   Lab Results  Component Value Date   TSH 1.670 04/22/2020   Lab  Results  Component Value Date   CHOL 119 07/30/2020   HDL 63 07/30/2020   LDLCALC 43 07/30/2020   TRIG 62 07/30/2020   Lab Results  Component Value Date   VD25OH 47.9 03/15/2021   VD25OH 45.0 12/02/2020   VD25OH 77.9 07/30/2020   Lab Results  Component Value Date   WBC 7.4 04/22/2020   HGB 14.6 04/22/2020   HCT 43.1 04/22/2020   MCV 92 04/22/2020   PLT 351 04/22/2020   No results found for: "IRON", "TIBC", "FERRITIN"  Attestation Statements:   Reviewed by clinician on  day of visit: allergies, medications, problem list, medical history, surgical history, family history, social history, and previous encounter notes.  I, Fortino Sic, RMA am acting as transcriptionist for Reuben Likes, MD.  I have reviewed the above documentation for accuracy and completeness, and I agree with the above. - Reuben Likes, MD

## 2021-11-22 ENCOUNTER — Encounter (INDEPENDENT_AMBULATORY_CARE_PROVIDER_SITE_OTHER): Payer: Self-pay | Admitting: Family Medicine

## 2021-11-23 NOTE — Telephone Encounter (Signed)
Please advise 

## 2021-12-11 ENCOUNTER — Other Ambulatory Visit (INDEPENDENT_AMBULATORY_CARE_PROVIDER_SITE_OTHER): Payer: Self-pay | Admitting: Family Medicine

## 2021-12-11 DIAGNOSIS — E1165 Type 2 diabetes mellitus with hyperglycemia: Secondary | ICD-10-CM

## 2021-12-16 ENCOUNTER — Encounter (INDEPENDENT_AMBULATORY_CARE_PROVIDER_SITE_OTHER): Payer: Self-pay | Admitting: Family Medicine

## 2021-12-16 ENCOUNTER — Ambulatory Visit (INDEPENDENT_AMBULATORY_CARE_PROVIDER_SITE_OTHER): Payer: 59 | Admitting: Family Medicine

## 2021-12-16 VITALS — BP 106/68 | HR 74 | Temp 98.6°F | Ht 64.0 in | Wt 298.0 lb

## 2021-12-16 DIAGNOSIS — E669 Obesity, unspecified: Secondary | ICD-10-CM | POA: Diagnosis not present

## 2021-12-16 DIAGNOSIS — E1165 Type 2 diabetes mellitus with hyperglycemia: Secondary | ICD-10-CM

## 2021-12-16 DIAGNOSIS — Z6841 Body Mass Index (BMI) 40.0 and over, adult: Secondary | ICD-10-CM

## 2021-12-16 DIAGNOSIS — Z7985 Long-term (current) use of injectable non-insulin antidiabetic drugs: Secondary | ICD-10-CM

## 2021-12-16 DIAGNOSIS — E559 Vitamin D deficiency, unspecified: Secondary | ICD-10-CM | POA: Diagnosis not present

## 2021-12-16 MED ORDER — TIRZEPATIDE 5 MG/0.5ML ~~LOC~~ SOAJ: 5.0000 mg | SUBCUTANEOUS | 0 refills | Status: DC

## 2021-12-17 ENCOUNTER — Other Ambulatory Visit (INDEPENDENT_AMBULATORY_CARE_PROVIDER_SITE_OTHER): Payer: Self-pay | Admitting: Family Medicine

## 2021-12-17 ENCOUNTER — Encounter (INDEPENDENT_AMBULATORY_CARE_PROVIDER_SITE_OTHER): Payer: Self-pay | Admitting: Family Medicine

## 2021-12-17 DIAGNOSIS — E1165 Type 2 diabetes mellitus with hyperglycemia: Secondary | ICD-10-CM

## 2021-12-20 NOTE — Telephone Encounter (Signed)
Rx sent, will call pharm

## 2021-12-29 NOTE — Progress Notes (Signed)
Chief Complaint:   OBESITY Angel Watkins is here to discuss her progress with her obesity treatment plan along with follow-up of her obesity related diagnoses. Angel Watkins is on the Category 3 Plan +100 and states she is following her eating plan approximately 40% of the time. Angel Watkins states she is doing water aerobics 45 minutes 2 times per week.  Today's visit was #: 25 Starting weight: 333 lbs Starting date: 04/22/2020 Today's weight: 298 lbs Today's date: 12/16/2021 Total lbs lost to date: 35 lbs Total lbs lost since last in-office visit: 0  Interim History: Angel Watkins really has not been feeling well over the last few weeks--thinks her hernia is really bothering her. She restarted water aerobics but is not sure she likes it. Her cousin invited her to her home for Thanksgiving. Pool will be closed for December.  Subjective:   1. Type 2 diabetes mellitus with hyperglycemia, without long-term current use of insulin ([place]) Jesha denies GI side effects. She is on Mounjaro 5 mg weekly. Her most recent A1c 5.8.  2. Vitamin D deficiency Angel Watkins is on 5K IU daily. Denies any nausea, vomiting or muscle weakness. She notes fatigue.  Assessment/Plan:   1. Type 2 diabetes mellitus with hyperglycemia, without long-term current use of insulin (HCC) We will refill Mounjaro 5 mg SubQ once weekly for 1 month with 0 refills.  Refill tirzepatide (MOUNJARO) 5 MG/0.5ML Pen; Inject 5 mg into the skin once a week.  Dispense: 2 mL; Refill: 0  2. Vitamin D deficiency Continue taking over the counter Vit D without changes in dose.  3. Obesity with current BMI of 51.2 Angel Watkins is currently in the action stage of change. As such, her goal is to continue with weight loss efforts. She has agreed to the Category 3 Plan +300.   Exercise goals: No exercise has been prescribed at this time.  Behavioral modification strategies: increasing lean protein intake, meal planning and cooking strategies, keeping healthy foods in  the home, and planning for success.  Angel Watkins has agreed to follow-up with our clinic in 3 weeks. She was informed of the importance of frequent follow-up visits to maximize her success with intensive lifestyle modifications for her multiple health conditions.   Objective:   Blood pressure 106/68, pulse 74, temperature 98.6 F (37 C), height 5\' 4"  (1.626 m), weight 298 lb (135.2 kg), SpO2 100 %. Body mass index is 51.15 kg/m.  General: Cooperative, alert, well developed, in no acute distress. HEENT: Conjunctivae and lids unremarkable. Cardiovascular: Regular rhythm.  Lungs: Normal work of breathing. Neurologic: No focal deficits.   Lab Results  Component Value Date   CREATININE 0.82 03/15/2021   BUN 19 03/15/2021   NA 143 03/15/2021   K 4.6 03/15/2021   CL 103 03/15/2021   CO2 25 03/15/2021   Lab Results  Component Value Date   ALT 26 03/15/2021   AST 23 03/15/2021   ALKPHOS 114 03/15/2021   BILITOT 0.5 03/15/2021   Lab Results  Component Value Date   HGBA1C 5.8 (H) 03/15/2021   HGBA1C 5.9 (H) 12/02/2020   HGBA1C 5.4 07/30/2020   HGBA1C 6.5 (H) 04/22/2020   HGBA1C 6.2 (H) 11/29/2018   Lab Results  Component Value Date   INSULIN 9.6 03/15/2021   INSULIN 11.1 12/02/2020   INSULIN 6.6 07/30/2020   INSULIN 10.8 04/22/2020   Lab Results  Component Value Date   TSH 1.670 04/22/2020   Lab Results  Component Value Date   CHOL 119 07/30/2020  HDL 63 07/30/2020   LDLCALC 43 07/30/2020   TRIG 62 07/30/2020   Lab Results  Component Value Date   VD25OH 47.9 03/15/2021   VD25OH 45.0 12/02/2020   VD25OH 77.9 07/30/2020   Lab Results  Component Value Date   WBC 7.4 04/22/2020   HGB 14.6 04/22/2020   HCT 43.1 04/22/2020   MCV 92 04/22/2020   PLT 351 04/22/2020   No results found for: "IRON", "TIBC", "FERRITIN"  Attestation Statements:   Reviewed by clinician on day of visit: allergies, medications, problem list, medical history, surgical history, family  history, social history, and previous encounter notes.  I, Fortino Sic, RMA am acting as transcriptionist for Reuben Likes, MD.  I have reviewed the above documentation for accuracy and completeness, and I agree with the above. - Reuben Likes, MD

## 2022-01-10 ENCOUNTER — Ambulatory Visit (INDEPENDENT_AMBULATORY_CARE_PROVIDER_SITE_OTHER): Payer: 59 | Admitting: Family Medicine

## 2022-01-10 ENCOUNTER — Encounter (INDEPENDENT_AMBULATORY_CARE_PROVIDER_SITE_OTHER): Payer: Self-pay | Admitting: Family Medicine

## 2022-01-10 VITALS — BP 106/68 | HR 70 | Temp 97.8°F | Ht 64.0 in | Wt 309.0 lb

## 2022-01-10 DIAGNOSIS — Z6841 Body Mass Index (BMI) 40.0 and over, adult: Secondary | ICD-10-CM | POA: Diagnosis not present

## 2022-01-10 DIAGNOSIS — E559 Vitamin D deficiency, unspecified: Secondary | ICD-10-CM

## 2022-01-10 DIAGNOSIS — E1165 Type 2 diabetes mellitus with hyperglycemia: Secondary | ICD-10-CM | POA: Diagnosis not present

## 2022-01-10 DIAGNOSIS — E669 Obesity, unspecified: Secondary | ICD-10-CM

## 2022-01-10 DIAGNOSIS — Z7985 Long-term (current) use of injectable non-insulin antidiabetic drugs: Secondary | ICD-10-CM

## 2022-01-10 MED ORDER — TIRZEPATIDE 5 MG/0.5ML ~~LOC~~ SOAJ: 5.0000 mg | SUBCUTANEOUS | 0 refills | Status: DC

## 2022-01-29 NOTE — Progress Notes (Signed)
Chief Complaint:   OBESITY Angel Watkins is here to discuss her progress with her obesity treatment plan along with follow-up of her obesity related diagnoses. Angel Watkins is on the Category 3 Plan + 300 calories and states she is following her eating plan approximately 65% of the time. Angel Watkins states she is doing water aerobics 45 minutes 2 times per week.  Today's visit was #: 26 Starting weight: 333 lbs Starting date: 04/22/2020 Today's weight: 309 lbs Today's date: 01/10/2022 Total lbs lost to date: 24 Total lbs lost since last in-office visit: +11  Interim History: Pt has had significant cravings since the last appt. For about 1.5-2 weeks, she ate whatever she wanted. Pt recognizes she was eating out of convenience and needs more readily available convenience options.  Subjective:   1. Type 2 diabetes mellitus with hyperglycemia, without long-term current use of insulin (HCC) Anabelle's last A1c was 5.8 with an insulin level of 9.6. She is on Mounjaro with no GI side effects.  2. Vitamin D deficiency Pt is on OTC Vit D 5,000 IU daily. She reports fatigue.  Assessment/Plan:   1. Type 2 diabetes mellitus with hyperglycemia, without long-term current use of insulin (HCC) [name], neuropathy, limb loss, blindness, coronary artery disease, and death. Intensive lifestyle modification including diet, exercise and weight loss are the first line of treatment for diabetes.   Refill- tirzepatide (MOUNJARO) 5 MG/0.5ML Pen; Inject 5 mg into the skin once a week.  Dispense: 2 mL; Refill: 0  2. Vitamin D deficiency Low Vitamin D level contributes to fatigue and are associated with obesity, breast, and colon cancer. She agrees to continue to take OTC Vitamin D 5,000 IU daily and will follow-up for routine testing of Vitamin D, at least 2-3 times per year to avoid over-replacement.  3. Obesity with  current BMI of 53.0 Angel Watkins is currently in the action stage of change. As such, her goal is to continue with weight loss efforts. She has agreed to the Category 3 Plan + 300 calories and keeping a food journal and adhering to recommended goals of 450-600 calories and 40+ grams protein with lunch.   Exercise goals:  As is  Behavioral modification strategies: increasing lean protein intake, meal planning and cooking strategies, keeping healthy foods in the home, holiday eating strategies , celebration eating strategies, planning for success, and keeping a strict food journal.  Angel Watkins has agreed to follow-up with our clinic in 3-4 weeks. She was informed of the importance of frequent follow-up visits to maximize her success with intensive lifestyle modifications for her multiple health conditions.   Objective:   Blood pressure 106/68, pulse 70, temperature 97.8 F (36.6 C), height 5\' 4"  (1.626 m), weight (!) 309 lb (140.2 kg), SpO2 97 %. Body mass index is 53.04 kg/m.  General: Cooperative, alert, well developed, in no acute distress. HEENT: Conjunctivae and lids unremarkable. Cardiovascular: Regular rhythm.  Lungs: Normal work of breathing. Neurologic: No focal deficits.   Lab Results  Component Value Date   CREATININE 0.82 03/15/2021   BUN 19 03/15/2021   NA 143 03/15/2021   K 4.6 03/15/2021   CL 103 03/15/2021   CO2 25 03/15/2021   Lab Results  Component Value Date   ALT 26 03/15/2021   AST 23 03/15/2021   ALKPHOS 114 03/15/2021   BILITOT 0.5 03/15/2021   Lab Results  Component Value Date   HGBA1C 5.8 (  H) 03/15/2021   HGBA1C 5.9 (H) 12/02/2020   HGBA1C 5.4 07/30/2020   HGBA1C 6.5 (H) 04/22/2020   HGBA1C 6.2 (H) 11/29/2018   Lab Results  Component Value Date   INSULIN 9.6 03/15/2021   INSULIN 11.1 12/02/2020   INSULIN 6.6 07/30/2020   INSULIN 10.8 04/22/2020   Lab Results  Component Value Date   TSH 1.670 04/22/2020   Lab Results  Component Value Date   CHOL  119 07/30/2020   HDL 63 07/30/2020   LDLCALC 43 07/30/2020   TRIG 62 07/30/2020   Lab Results  Component Value Date   VD25OH 47.9 03/15/2021   VD25OH 45.0 12/02/2020   VD25OH 77.9 07/30/2020   Lab Results  Component Value Date   WBC 7.4 04/22/2020   HGB 14.6 04/22/2020   HCT 43.1 04/22/2020   MCV 92 04/22/2020   PLT 351 04/22/2020    Attestation Statements:   Reviewed by clinician on day of visit: allergies, medications, problem list, medical history, surgical history, family history, social history, and previous encounter notes.  I, Kyung Rudd, BS, CMA, am acting as transcriptionist for Reuben Likes, MD.  I have reviewed the above documentation for accuracy and completeness, and I agree with the above. - Reuben Likes, MD

## 2022-02-10 ENCOUNTER — Ambulatory Visit (INDEPENDENT_AMBULATORY_CARE_PROVIDER_SITE_OTHER): Payer: 59 | Admitting: Family Medicine

## 2022-02-10 ENCOUNTER — Encounter (INDEPENDENT_AMBULATORY_CARE_PROVIDER_SITE_OTHER): Payer: Self-pay | Admitting: Family Medicine

## 2022-02-10 VITALS — BP 106/67 | HR 65 | Temp 97.8°F | Ht 64.0 in | Wt 308.0 lb

## 2022-02-10 DIAGNOSIS — Z7985 Long-term (current) use of injectable non-insulin antidiabetic drugs: Secondary | ICD-10-CM | POA: Diagnosis not present

## 2022-02-10 DIAGNOSIS — E1165 Type 2 diabetes mellitus with hyperglycemia: Secondary | ICD-10-CM | POA: Diagnosis not present

## 2022-02-10 DIAGNOSIS — Z6841 Body Mass Index (BMI) 40.0 and over, adult: Secondary | ICD-10-CM | POA: Diagnosis not present

## 2022-02-10 DIAGNOSIS — E669 Obesity, unspecified: Secondary | ICD-10-CM

## 2022-02-15 ENCOUNTER — Encounter (INDEPENDENT_AMBULATORY_CARE_PROVIDER_SITE_OTHER): Payer: Self-pay | Admitting: Family Medicine

## 2022-02-15 MED ORDER — TIRZEPATIDE 5 MG/0.5ML ~~LOC~~ SOAJ: 5.0000 mg | SUBCUTANEOUS | 0 refills | Status: DC

## 2022-02-21 NOTE — Progress Notes (Signed)
Chief Complaint:   OBESITY Angel Watkins is here to discuss her progress with her obesity treatment plan along with follow-up of her obesity related diagnoses. Angel Watkins is on the Category 3 Plan and keeping a food journal and adhering to recommended goals of 450-600 calories and 40+ grams of protein and states she is following her eating plan approximately 60% of the time. Angel Watkins states she is exercising 0 minutes 0 times per week.  Today's visit was #: 27 Starting weight: 333 lbs Starting date: 04/22/2020 Today's weight: 308 lbs Today's date: 02/10/2022 Total lbs lost to date: 25 lbs Total lbs lost since last in-office visit: 1  Interim History: Angel Watkins had a really good holiday season and for 1st time she did not fell depressed.  She felt very fortunate that there was many desserts she did not care for.  Did indulge in potato pie and felt nausea halfway thru.  Has drank muscle milk 220 cal with 40 grams of protein.  Subjective:   1. Type 2 diabetes mellitus with hyperglycemia, without long-term current use of insulin (HCC) Recent A1c 5.3!  On Mounjaro without GI side effects.  Assessment/Plan:   1. Type 2 diabetes mellitus with hyperglycemia, without long-term current use of insulin (HCC) We will refill Mounjaro 5 mg SubQ once weekly for 1 month with 0 refills.  -Refill tirzepatide (MOUNJARO) 5 MG/0.5ML Pen; Inject 5 mg into the skin once a week.  Dispense: 2 mL; Refill: 0  2. Obesity with current BMI of 52.9 Angel Watkins is currently in the action stage of change. As such, her goal is to continue with weight loss efforts. She has agreed to the Category 3 Plan and keeping a food journal and adhering to recommended goals of 450-600 calories and 40+ grams of protein supper.   Exercise goals: All adults should avoid inactivity. Some physical activity is better than none, and adults who participate in any amount of physical activity gain some health benefits.  Behavioral modification strategies:  increasing lean protein intake, meal planning and cooking strategies, keeping healthy foods in the home, and planning for success.  Angel Watkins has agreed to follow-up with our clinic in 3 weeks. She was informed of the importance of frequent follow-up visits to maximize her success with intensive lifestyle modifications for her multiple health conditions.   Objective:   Blood pressure 106/67, pulse 65, temperature 97.8 F (36.6 C), height 5\' 4"  (1.626 m), weight (!) 308 lb (139.7 kg), SpO2 98 %. Body mass index is 52.87 kg/m.  General: Cooperative, alert, well developed, in no acute distress. HEENT: Conjunctivae and lids unremarkable. Cardiovascular: Regular rhythm.  Lungs: Normal work of breathing. Neurologic: No focal deficits.   Lab Results  Component Value Date   CREATININE 0.82 03/15/2021   BUN 19 03/15/2021   NA 143 03/15/2021   K 4.6 03/15/2021   CL 103 03/15/2021   CO2 25 03/15/2021   Lab Results  Component Value Date   ALT 26 03/15/2021   AST 23 03/15/2021   ALKPHOS 114 03/15/2021   BILITOT 0.5 03/15/2021   Lab Results  Component Value Date   HGBA1C 5.8 (H) 03/15/2021   HGBA1C 5.9 (H) 12/02/2020   HGBA1C 5.4 07/30/2020   HGBA1C 6.5 (H) 04/22/2020   HGBA1C 6.2 (H) 11/29/2018   Lab Results  Component Value Date   INSULIN 9.6 03/15/2021   INSULIN 11.1 12/02/2020   INSULIN 6.6 07/30/2020   INSULIN 10.8 04/22/2020   Lab Results  Component Value Date  TSH 1.670 04/22/2020   Lab Results  Component Value Date   CHOL 119 07/30/2020   HDL 63 07/30/2020   LDLCALC 43 07/30/2020   TRIG 62 07/30/2020   Lab Results  Component Value Date   VD25OH 47.9 03/15/2021   VD25OH 45.0 12/02/2020   VD25OH 77.9 07/30/2020   Lab Results  Component Value Date   WBC 7.4 04/22/2020   HGB 14.6 04/22/2020   HCT 43.1 04/22/2020   MCV 92 04/22/2020   PLT 351 04/22/2020   No results found for: "IRON", "TIBC", "FERRITIN"  Attestation Statements:   Reviewed by clinician  on day of visit: allergies, medications, problem list, medical history, surgical history, family history, social history, and previous encounter notes.  I, Elnora Morrison, RMA am acting as transcriptionist for Coralie Common, MD.  I have reviewed the above documentation for accuracy and completeness, and I agree with the above. - Coralie Common, MD

## 2022-03-02 ENCOUNTER — Ambulatory Visit (INDEPENDENT_AMBULATORY_CARE_PROVIDER_SITE_OTHER): Payer: 59 | Admitting: Family Medicine

## 2022-03-02 ENCOUNTER — Encounter (INDEPENDENT_AMBULATORY_CARE_PROVIDER_SITE_OTHER): Payer: Self-pay | Admitting: Family Medicine

## 2022-03-02 VITALS — BP 114/75 | HR 78 | Temp 97.6°F | Ht 64.0 in | Wt 308.0 lb

## 2022-03-02 DIAGNOSIS — E1165 Type 2 diabetes mellitus with hyperglycemia: Secondary | ICD-10-CM | POA: Diagnosis not present

## 2022-03-02 DIAGNOSIS — Z6841 Body Mass Index (BMI) 40.0 and over, adult: Secondary | ICD-10-CM

## 2022-03-02 DIAGNOSIS — E669 Obesity, unspecified: Secondary | ICD-10-CM

## 2022-03-02 DIAGNOSIS — E1169 Type 2 diabetes mellitus with other specified complication: Secondary | ICD-10-CM | POA: Diagnosis not present

## 2022-03-02 DIAGNOSIS — E785 Hyperlipidemia, unspecified: Secondary | ICD-10-CM | POA: Diagnosis not present

## 2022-03-02 DIAGNOSIS — Z7985 Long-term (current) use of injectable non-insulin antidiabetic drugs: Secondary | ICD-10-CM

## 2022-03-02 MED ORDER — TIRZEPATIDE 5 MG/0.5ML ~~LOC~~ SOAJ: 5.0000 mg | SUBCUTANEOUS | 0 refills | Status: DC

## 2022-03-03 ENCOUNTER — Ambulatory Visit (INDEPENDENT_AMBULATORY_CARE_PROVIDER_SITE_OTHER): Payer: 59 | Admitting: Family Medicine

## 2022-03-03 ENCOUNTER — Encounter (INDEPENDENT_AMBULATORY_CARE_PROVIDER_SITE_OTHER): Payer: Self-pay

## 2022-03-14 NOTE — Progress Notes (Signed)
Chief Complaint:   OBESITY Angel Watkins is here to discuss her progress with her obesity treatment plan along with follow-up of her obesity related diagnoses. Angel Watkins is on keeping a food journal and adhering to recommended goals of 450-600 calories and 40+ grams  protein and states she is following her eating plan approximately 60% of the time. Angel Watkins states she is bands 20 minutes 3 times per week.  Today's visit was #: 28 Starting weight: 333 lbs Starting date: 04/22/2020 Today's weight: 308 lbs Today's date: 03/02/2022 Total lbs lost to date: 25 lbs Total lbs lost since last in-office visit: 0  Interim History: Angel Watkins has had a really emotional last month.  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  Has used the resistance bands a few times a week.  She realizes she has not been eating enough.  Voices she skipped dinner daily almost all month.  Subjective:   1. Type 2 diabetes mellitus with hyperglycemia, without long-term current use of insulin (HCC) Denies GI side effects noted on Mounjaro.  Med is helping curb carb intake.  2. Hyperlipidemia associated with type 2 diabetes mellitus (Angel Watkins) On Lipitor with no myalgias or transaminitis.  Assessment/Plan:   1. Type 2 diabetes mellitus with hyperglycemia, without long-term current use of insulin (HCC) Will refill Mounjaro 5 mg SubQ once a week for 1 month with 0 refills.  -Refill tirzepatide (MOUNJARO) 5 MG/0.5ML Pen; Inject 5 mg into the skin once a week.  Dispense: 2 mL; Refill: 0  2. Hyperlipidemia associated with type 2 diabetes mellitus (Angel Watkins) Continue statin without change in medication or dose.  3. BMI 50.0-59.9, adult (Angel Watkins)  4. Obesity with starting BMI of 57.1 Angel Watkins is currently in the action stage of change. As such, her goal is to continue with weight loss efforts. She has agreed to keeping a food journal and adhering to recommended goals of 450-600 calories and 40+ grams protein  supper.   Exercise goals: All adults should avoid inactivity. Some physical activity is better than none, and adults who participate in any amount of physical activity gain some health benefits.  Behavioral modification strategies: increasing lean protein intake, no skipping meals, meal planning and cooking strategies, keeping healthy foods in the home, and planning for success.  Angel Watkins has agreed to follow-up with our clinic in 3 weeks. She was informed of the importance of frequent follow-up visits to maximize her success with intensive lifestyle modifications for her multiple health conditions.   Objective:   Blood pressure 114/75, pulse 78, temperature 97.6 F (36.4 C), height '5\' 4"'$  (1.626 m), weight (!) 308 lb (139.7 kg), SpO2 99 %. Body mass index is 52.87 kg/m.  General: Cooperative, alert, well developed, in no acute distress. HEENT: Conjunctivae and lids unremarkable. Cardiovascular: Regular rhythm.  Lungs: Normal work of breathing. Neurologic: No focal deficits.   Lab Results  Component Value Date   CREATININE 0.82 03/15/2021   BUN 19 03/15/2021   NA 143 03/15/2021   K 4.6 03/15/2021   CL 103 03/15/2021   CO2 25 03/15/2021   Lab Results  Component Value Date   ALT 26 03/15/2021   AST 23 03/15/2021   ALKPHOS 114 03/15/2021   BILITOT 0.5 03/15/2021   Lab Results  Component Value Date   HGBA1C 5.8 (H) 03/15/2021   HGBA1C 5.9 (H) 12/02/2020   HGBA1C 5.4 07/30/2020   HGBA1C 6.5 (H) 04/22/2020   HGBA1C 6.2 (H) 11/29/2018   Lab  Results  Component Value Date   INSULIN 9.6 03/15/2021   INSULIN 11.1 12/02/2020   INSULIN 6.6 07/30/2020   INSULIN 10.8 04/22/2020   Lab Results  Component Value Date   TSH 1.670 04/22/2020   Lab Results  Component Value Date   CHOL 119 07/30/2020   HDL 63 07/30/2020   LDLCALC 43 07/30/2020   TRIG 62 07/30/2020   Lab Results  Component Value Date   VD25OH 47.9 03/15/2021   VD25OH 45.0 12/02/2020   VD25OH 77.9 07/30/2020    Lab Results  Component Value Date   WBC 7.4 04/22/2020   HGB 14.6 04/22/2020   HCT 43.1 04/22/2020   MCV 92 04/22/2020   PLT 351 04/22/2020   No results found for: "IRON", "TIBC", "FERRITIN"  Attestation Statements:   Reviewed by clinician on day of visit: allergies, medications, problem list, medical history, surgical history, family history, social history, and previous encounter notes.  I, Elnora Morrison, RMA am acting as transcriptionist for Coralie Common, MD.  I have reviewed the above documentation for accuracy and completeness, and I agree with the above. - Coralie Common, MD

## 2022-03-31 ENCOUNTER — Ambulatory Visit (INDEPENDENT_AMBULATORY_CARE_PROVIDER_SITE_OTHER): Payer: 59 | Admitting: Family Medicine

## 2022-03-31 ENCOUNTER — Encounter (INDEPENDENT_AMBULATORY_CARE_PROVIDER_SITE_OTHER): Payer: Self-pay | Admitting: Family Medicine

## 2022-03-31 VITALS — BP 111/70 | HR 63 | Temp 97.7°F | Ht 64.0 in | Wt 309.2 lb

## 2022-03-31 DIAGNOSIS — E669 Obesity, unspecified: Secondary | ICD-10-CM | POA: Diagnosis not present

## 2022-03-31 DIAGNOSIS — I152 Hypertension secondary to endocrine disorders: Secondary | ICD-10-CM | POA: Diagnosis not present

## 2022-03-31 DIAGNOSIS — E1165 Type 2 diabetes mellitus with hyperglycemia: Secondary | ICD-10-CM | POA: Diagnosis not present

## 2022-03-31 DIAGNOSIS — E1159 Type 2 diabetes mellitus with other circulatory complications: Secondary | ICD-10-CM | POA: Diagnosis not present

## 2022-03-31 DIAGNOSIS — Z6841 Body Mass Index (BMI) 40.0 and over, adult: Secondary | ICD-10-CM

## 2022-03-31 DIAGNOSIS — Z7985 Long-term (current) use of injectable non-insulin antidiabetic drugs: Secondary | ICD-10-CM

## 2022-03-31 MED ORDER — TIRZEPATIDE 5 MG/0.5ML ~~LOC~~ SOAJ: 5.0000 mg | SUBCUTANEOUS | 0 refills | Status: DC

## 2022-03-31 NOTE — Progress Notes (Signed)
Chief Complaint:   OBESITY Angel Watkins is here to discuss her progress with her obesity treatment plan along with follow-up of her obesity related diagnoses. Angel Watkins is on keeping a food journal and adhering to recommended goals of 450-600 calories and 40 grams protein with supper and states she is following her eating plan approximately 100% of the time. Angel Watkins states she is not currently exercising.  Today's visit was #: 58 Starting weight: 333 lbs Starting date: 04/22/2020 Today's weight: 309 lbs Today's date: 03/31/2022 Total lbs lost to date: 24 Total lbs lost since last in-office visit: +1  Interim History: Since last appointment she has been struggling with dinner intake.  She is doing more meals in a bag for the next few weeks in order to have ease and some convenience.  Breakfast and lunch are relatively easy to follow plan.  Work has been stressful and divisive and patient feels like she has done quite a bit of emotional non eating.   Subjective:   1. Type 2 diabetes mellitus with hyperglycemia, without long-term current use of insulin ([place]) Angel Watkins is on Mounjaro 5 mg weekly and her last A1c was still elevated.  2. Hypertension associated with diabetes ([place]) BP controlled today. Angel Watkins denies chest pain/chest pressure/headache.  Assessment/Plan:   1. Type 2 diabetes mellitus with hyperglycemia, without long-term current use of insulin (HCC) [name], neuropathy, limb loss, blindness, coronary artery disease, and death. Intensive lifestyle modification including diet, exercise and weight loss are the first line of treatment for diabetes.  Continue current treatment plan.  Refill- tirzepatide (MOUNJARO) 5 MG/0.5ML Pen; Inject 5 mg into the skin once a week.  Dispense: 2 mL; Refill: 0  2. Hypertension associated with diabetes ([place]) Angel Watkins is working on healthy weight loss and  exercise to improve blood pressure control. We will watch for signs of hypotension as she continues her lifestyle modifications. Continue Avapro with no change in dose.  3. BMI 50.0-59.9, adult ([place]) 4. Obesity with starting BMI of 57.1 Angel Watkins is currently in the action stage of change. As such, her goal is to continue with weight loss efforts. She has agreed to the Category 2 Plan.   Exercise goals: No exercise has been prescribed at this time.  Behavioral modification strategies: increasing lean protein intake, no skipping meals, meal planning and cooking strategies, keeping healthy foods in the home, and planning for success.  Angel Watkins has agreed to follow-up with our clinic in 3 weeks. She was informed of the importance of frequent follow-up visits to maximize her success with intensive lifestyle modifications for her multiple health conditions.   Objective:   Blood pressure 111/70, pulse 63, temperature 97.7 F (36.5 C), height 5\' 4"  (1.626 m), weight (!) 309 lb 3.2 oz (140.3 kg), SpO2 99 %. Body mass index is 53.07 kg/m.  General: Cooperative, alert, well developed, in no acute distress. HEENT: Conjunctivae and lids unremarkable. Cardiovascular: Regular rhythm.  Lungs: Normal work of breathing. Neurologic: No focal deficits.   Lab Results  Component Value Date   CREATININE 0.82 03/15/2021   BUN 19 03/15/2021   NA 143 03/15/2021   K 4.6 03/15/2021   CL 103 03/15/2021   CO2 25 03/15/2021   Lab Results  Component Value Date   ALT 26 03/15/2021   AST 23 03/15/2021   ALKPHOS 114 03/15/2021   BILITOT 0.5 03/15/2021   Lab Results  Component Value Date  HGBA1C 5.8 (H) 03/15/2021   HGBA1C 5.9 (H) 12/02/2020   HGBA1C 5.4 07/30/2020   HGBA1C 6.5 (H) 04/22/2020   HGBA1C 6.2 (H) 11/29/2018   Lab Results  Component Value Date   INSULIN 9.6 03/15/2021   INSULIN 11.1 12/02/2020   INSULIN 6.6 07/30/2020   INSULIN 10.8 04/22/2020   Lab Results  Component Value Date   TSH  1.670 04/22/2020   Lab Results  Component Value Date   CHOL 119 07/30/2020   HDL 63 07/30/2020   LDLCALC 43 07/30/2020   TRIG 62 07/30/2020   Lab Results  Component Value Date   VD25OH 47.9 03/15/2021   VD25OH 45.0 12/02/2020   VD25OH 77.9 07/30/2020   Lab Results  Component Value Date   WBC 7.4 04/22/2020   HGB 14.6 04/22/2020   HCT 43.1 04/22/2020   MCV 92 04/22/2020   PLT 351 04/22/2020    Attestation Statements:   Reviewed by clinician on day of visit: allergies, medications, problem list, medical history, surgical history, family history, social history, and previous encounter notes.  I, Kathlene November, BS, CMA, am acting as transcriptionist for Coralie Common, MD.   I have reviewed the above documentation for accuracy and completeness, and I agree with the above. - Coralie Common, MD

## 2022-04-20 ENCOUNTER — Ambulatory Visit (INDEPENDENT_AMBULATORY_CARE_PROVIDER_SITE_OTHER): Payer: 59 | Admitting: Family Medicine

## 2022-04-20 ENCOUNTER — Encounter (INDEPENDENT_AMBULATORY_CARE_PROVIDER_SITE_OTHER): Payer: Self-pay | Admitting: Family Medicine

## 2022-04-20 VITALS — BP 103/66 | HR 66 | Temp 97.7°F | Ht 64.0 in | Wt 309.0 lb

## 2022-04-20 DIAGNOSIS — Z7985 Long-term (current) use of injectable non-insulin antidiabetic drugs: Secondary | ICD-10-CM

## 2022-04-20 DIAGNOSIS — E1159 Type 2 diabetes mellitus with other circulatory complications: Secondary | ICD-10-CM | POA: Diagnosis not present

## 2022-04-20 DIAGNOSIS — I152 Hypertension secondary to endocrine disorders: Secondary | ICD-10-CM

## 2022-04-20 DIAGNOSIS — E669 Obesity, unspecified: Secondary | ICD-10-CM | POA: Diagnosis not present

## 2022-04-20 DIAGNOSIS — Z6841 Body Mass Index (BMI) 40.0 and over, adult: Secondary | ICD-10-CM

## 2022-04-20 DIAGNOSIS — E1165 Type 2 diabetes mellitus with hyperglycemia: Secondary | ICD-10-CM | POA: Diagnosis not present

## 2022-04-20 NOTE — Progress Notes (Signed)
Chief Complaint:   OBESITY Angel Watkins is here to discuss her progress with her obesity treatment plan along with follow-up of her obesity related diagnoses. Angel Watkins is on the Category 2 Plan and states she is following her eating plan approximately 50% of the time. Angel Watkins states she is more active.  Today's visit was #: 40 Starting weight: 333 lbs Starting date: 04/22/2020 Today's weight: 309 lbs Today's date: 04/20/2022 Total lbs lost to date: 24 lbs Total lbs lost since last in-office visit: 0  Interim History: Patient experiencing some sleep changes recently with inability to stay asleep. She has been compliant with meal plan 50% of the time.  She is back in the same emotional state she was when her father died.  Dinner continues to be difficult due to helping her aunt clean out her grandmother's house.  Patient said this past few weeks she hasn't just lost motivation with the meal plan but with work as well.  She is experiencing some familial disagreements.   Subjective:   1. Type 2 diabetes mellitus with hyperglycemia, without long-term current use of insulin ([place]) Patient is on Mounjaro weekly.  No GI side effects noted.   2. Hypertension associated with type 2 diabetes mellitus ([place]) Patient is on Avapro.  Blood pressure was well controlled today. Patient denies chest pain, chest pressure, or headache.   Assessment/Plan:   1. Type 2 diabetes mellitus with hyperglycemia, without long-term current use of insulin (HCC) Refill Mounjaro 5 mg SQ weekly, # 2 mL, no refills.   2. Hypertension associated with type 2 diabetes mellitus ([place]) Continue current medications no change in dose.   3. BMI 50.0-59.9, adult ([place])  4. Obesity with starting BMI of 57.1 Angel Watkins is currently in the action stage of change. As such, her goal is to continue with weight loss efforts. She has agreed to the Category 2 Plan.   Exercise goals: All adults should avoid inactivity. Some physical activity is  better than none, and adults who participate in any amount of physical activity gain some health benefits.  Behavioral modification strategies: increasing lean protein intake, no skipping meals, meal planning and cooking strategies, keeping healthy foods in the home, and planning for success.  Angel Watkins has agreed to follow-up with our clinic in 4 weeks. She was informed of the importance of frequent follow-up visits to maximize her success with intensive lifestyle modifications for her multiple health conditions.   Objective:   Blood pressure 103/66, pulse 66, temperature 97.7 F (36.5 C), height 5\' 4"  (1.626 m), weight (!) 309 lb (140.2 kg), SpO2 100 %. Body mass index is 53.04 kg/m.  General: Cooperative, alert, well developed, in no acute distress. HEENT: Conjunctivae and lids unremarkable. Cardiovascular: Regular rhythm.  Lungs: Normal work of breathing. Neurologic: No focal deficits.   Lab Results  Component Value Date   CREATININE 0.82 03/15/2021   BUN 19 03/15/2021   NA 143 03/15/2021   K 4.6 03/15/2021   CL 103 03/15/2021   CO2 25 03/15/2021   Lab Results  Component Value Date   ALT 26 03/15/2021   AST 23 03/15/2021   ALKPHOS 114 03/15/2021   BILITOT 0.5 03/15/2021   Lab Results  Component Value Date   HGBA1C 5.8 (H) 03/15/2021   HGBA1C 5.9 (H) 12/02/2020   HGBA1C 5.4 07/30/2020   HGBA1C 6.5 (H) 04/22/2020   HGBA1C 6.2 (H) 11/29/2018   Lab Results  Component Value Date   INSULIN 9.6 03/15/2021   INSULIN 11.1 12/02/2020  INSULIN 6.6 07/30/2020   INSULIN 10.8 04/22/2020   Lab Results  Component Value Date   TSH 1.670 04/22/2020   Lab Results  Component Value Date   CHOL 119 07/30/2020   HDL 63 07/30/2020   LDLCALC 43 07/30/2020   TRIG 62 07/30/2020   Lab Results  Component Value Date   VD25OH 47.9 03/15/2021   VD25OH 45.0 12/02/2020   VD25OH 77.9 07/30/2020   Lab Results  Component Value Date   WBC 7.4 04/22/2020   HGB 14.6 04/22/2020   HCT  43.1 04/22/2020   MCV 92 04/22/2020   PLT 351 04/22/2020   No results found for: "IRON", "TIBC", "FERRITIN"  Attestation Statements:   Reviewed by clinician on day of visit: allergies, medications, problem list, medical history, surgical history, family history, social history, and previous encounter notes.  I, Davy Pique, RMA, am acting as transcriptionist for Coralie Common, MD.  I have reviewed the above documentation for accuracy and completeness, and I agree with the above. - Coralie Common, MD

## 2022-04-25 MED ORDER — TIRZEPATIDE 5 MG/0.5ML ~~LOC~~ SOAJ: 5.0000 mg | SUBCUTANEOUS | 0 refills | Status: DC

## 2022-05-30 ENCOUNTER — Encounter (INDEPENDENT_AMBULATORY_CARE_PROVIDER_SITE_OTHER): Payer: Self-pay | Admitting: Family Medicine

## 2022-05-30 ENCOUNTER — Ambulatory Visit (INDEPENDENT_AMBULATORY_CARE_PROVIDER_SITE_OTHER): Payer: 59 | Admitting: Family Medicine

## 2022-05-30 VITALS — BP 104/62 | HR 69 | Temp 97.8°F | Ht 64.0 in | Wt 311.0 lb

## 2022-05-30 DIAGNOSIS — E1165 Type 2 diabetes mellitus with hyperglycemia: Secondary | ICD-10-CM

## 2022-05-30 DIAGNOSIS — Z7985 Long-term (current) use of injectable non-insulin antidiabetic drugs: Secondary | ICD-10-CM

## 2022-05-30 DIAGNOSIS — Z6841 Body Mass Index (BMI) 40.0 and over, adult: Secondary | ICD-10-CM | POA: Diagnosis not present

## 2022-05-30 DIAGNOSIS — E669 Obesity, unspecified: Secondary | ICD-10-CM

## 2022-05-30 DIAGNOSIS — M48061 Spinal stenosis, lumbar region without neurogenic claudication: Secondary | ICD-10-CM

## 2022-05-30 MED ORDER — TIRZEPATIDE 5 MG/0.5ML ~~LOC~~ SOAJ: 5.0000 mg | SUBCUTANEOUS | 0 refills | Status: DC

## 2022-05-30 MED ORDER — TRAMADOL HCL ER 300 MG PO TB24: 300.0000 mg | ORAL_TABLET | Freq: Every day | ORAL | Status: DC

## 2022-05-30 NOTE — Progress Notes (Signed)
Chief Complaint:   OBESITY Angel Watkins is here to discuss her progress with her obesity treatment plan along with follow-up of her obesity related diagnoses. Angel Watkins is on the Category 2 Plan and states she is following her eating plan approximately 60% of the time. Angel Watkins states she is doing more walking.   Today's visit was #: 31 Starting weight: 333 lbs Starting date: 04/22/2020 Today's weight: 311 lbs Today's date: 05/30/2022 Total lbs lost to date: 22 lbs Total lbs lost since last in-office visit: 2 lbs  Interim History: Patient noticed that she has not been eating much not because she didn't want to but because she isn't hungry.  This is the first time she has noticed this.  Many days she skipped lunch or dinner or both.  She goes on vacation at the end of May.  Goal is to focus on the food and attempt for 3lbs a week or the next 4 weeks.  Realizes that she is more structured M-F and so it is easiest to stay compliant to plan.   Subjective:   1. Type 2 diabetes mellitus with hyperglycemia, without long-term current use of insulin (HCC) Patient is on Mounjaro weekly.  No side effects noted.  Patient last A1c was 5.8, insulin 9.6.  2. Spinal stenosis of lumbar region, unspecified whether neurogenic claudication present Patient sees pain management.  Patient is on tramadol 200 mg XR previously, now increased to 300 mg XR 2 times daily.  Assessment/Plan:   1. Type 2 diabetes mellitus with hyperglycemia, without long-term current use of insulin (HCC) Refill- tirzepatide (MOUNJARO) 5 MG/0.5ML Pen; Inject 5 mg into the skin once a week.  Dispense: 2 mL; Refill: 0  2. Spinal stenosis of lumbar region, unspecified whether neurogenic claudication present Medication updated- traMADol (ULTRAM-ER) 300 MG 24 hr tablet; Take 1 tablet (300 mg total) by mouth daily.  3. BMI 50.0-59.9, adult (HCC)  4. Obesity with starting BMI of 57.1 Angel Watkins is currently in the action stage of change. As  such, her goal is to continue with weight loss efforts. She has agreed to the Category 2 Plan.  Discussed the importance of using weekends as a different sort of schedule.   Exercise goals: All adults should avoid inactivity. Some physical activity is better than none, and adults who participate in any amount of physical activity gain some health benefits.  Behavioral modification strategies: increasing lean protein intake, meal planning and cooking strategies, keeping healthy foods in the home, and planning for success.  Angel Watkins has agreed to follow-up with our clinic in 3-4 weeks. She was informed of the importance of frequent follow-up visits to maximize her success with intensive lifestyle modifications for her multiple health conditions.   Objective:   Blood pressure 104/62, pulse 69, temperature 97.8 F (36.6 C), height 5\' 4"  (1.626 m), weight (!) 311 lb (141.1 kg), SpO2 100 %. Body mass index is 53.38 kg/m.  General: Cooperative, alert, well developed, in no acute distress. HEENT: Conjunctivae and lids unremarkable. Cardiovascular: Regular rhythm.  Lungs: Normal work of breathing. Neurologic: No focal deficits.   Lab Results  Component Value Date   CREATININE 0.82 03/15/2021   BUN 19 03/15/2021   NA 143 03/15/2021   K 4.6 03/15/2021   CL 103 03/15/2021   CO2 25 03/15/2021   Lab Results  Component Value Date   ALT 26 03/15/2021   AST 23 03/15/2021   ALKPHOS 114 03/15/2021   BILITOT 0.5 03/15/2021   Lab Results  Component Value Date   HGBA1C 5.8 (H) 03/15/2021   HGBA1C 5.9 (H) 12/02/2020   HGBA1C 5.4 07/30/2020   HGBA1C 6.5 (H) 04/22/2020   HGBA1C 6.2 (H) 11/29/2018   Lab Results  Component Value Date   INSULIN 9.6 03/15/2021   INSULIN 11.1 12/02/2020   INSULIN 6.6 07/30/2020   INSULIN 10.8 04/22/2020   Lab Results  Component Value Date   TSH 1.670 04/22/2020   Lab Results  Component Value Date   CHOL 119 07/30/2020   HDL 63 07/30/2020   LDLCALC 43  07/30/2020   TRIG 62 07/30/2020   Lab Results  Component Value Date   VD25OH 47.9 03/15/2021   VD25OH 45.0 12/02/2020   VD25OH 77.9 07/30/2020   Lab Results  Component Value Date   WBC 7.4 04/22/2020   HGB 14.6 04/22/2020   HCT 43.1 04/22/2020   MCV 92 04/22/2020   PLT 351 04/22/2020   No results found for: "IRON", "TIBC", "FERRITIN"  Attestation Statements:   Reviewed by clinician on day of visit: allergies, medications, problem list, medical history, surgical history, family history, social history, and previous encounter notes.  I, Malcolm Metro, RMA, am acting as transcriptionist for Angel Likes, MD.  I have reviewed the above documentation for accuracy and completeness, and I agree with the above. - Angel Likes, MD

## 2022-06-12 ENCOUNTER — Other Ambulatory Visit (INDEPENDENT_AMBULATORY_CARE_PROVIDER_SITE_OTHER): Payer: Self-pay | Admitting: Family Medicine

## 2022-06-12 DIAGNOSIS — E1165 Type 2 diabetes mellitus with hyperglycemia: Secondary | ICD-10-CM

## 2022-06-28 ENCOUNTER — Ambulatory Visit (INDEPENDENT_AMBULATORY_CARE_PROVIDER_SITE_OTHER): Payer: 59 | Admitting: Family Medicine

## 2022-06-28 ENCOUNTER — Encounter (INDEPENDENT_AMBULATORY_CARE_PROVIDER_SITE_OTHER): Payer: Self-pay | Admitting: Family Medicine

## 2022-06-28 VITALS — BP 99/66 | HR 77 | Temp 97.7°F | Ht 64.0 in | Wt 315.0 lb

## 2022-06-28 DIAGNOSIS — E559 Vitamin D deficiency, unspecified: Secondary | ICD-10-CM

## 2022-06-28 DIAGNOSIS — I152 Hypertension secondary to endocrine disorders: Secondary | ICD-10-CM

## 2022-06-28 DIAGNOSIS — E1169 Type 2 diabetes mellitus with other specified complication: Secondary | ICD-10-CM

## 2022-06-28 DIAGNOSIS — E1165 Type 2 diabetes mellitus with hyperglycemia: Secondary | ICD-10-CM

## 2022-06-28 DIAGNOSIS — Z6841 Body Mass Index (BMI) 40.0 and over, adult: Secondary | ICD-10-CM

## 2022-06-28 DIAGNOSIS — Z7985 Long-term (current) use of injectable non-insulin antidiabetic drugs: Secondary | ICD-10-CM

## 2022-06-28 DIAGNOSIS — E1159 Type 2 diabetes mellitus with other circulatory complications: Secondary | ICD-10-CM | POA: Diagnosis not present

## 2022-06-28 DIAGNOSIS — E785 Hyperlipidemia, unspecified: Secondary | ICD-10-CM

## 2022-06-28 DIAGNOSIS — R600 Localized edema: Secondary | ICD-10-CM

## 2022-06-28 DIAGNOSIS — E669 Obesity, unspecified: Secondary | ICD-10-CM

## 2022-06-28 MED ORDER — SEMAGLUTIDE (1 MG/DOSE) 4 MG/3ML ~~LOC~~ SOPN: 1.0000 mg | PEN_INJECTOR | SUBCUTANEOUS | 0 refills | Status: DC

## 2022-06-28 NOTE — Progress Notes (Signed)
Chief Complaint:   OBESITY Angel Watkins is here to discuss her progress with her obesity treatment plan along with follow-up of her obesity related diagnoses. [name] is on the Category 2 Plan and states she is following her eating plan approximately 75-80% of the time. [name] states she is doing 0 minutes 0 times per week.  Today's visit was #: 32 Starting weight: 333 lbs Starting date: 04/22/2020 Today's weight: 315 lbs Today's date: 06/28/2022 Total lbs lost to date: 18 Total lbs lost since last in-office visit: 0  Interim History: Patient has noticed more hunger over the last few weeks at the end of the week. She has missed a few dinners mainly due to fatigue.  Her husband has sleep apnea and he has been waking her up quite a bit recently.  Saturday she and her husband looked at a house and ran a few errands and she ended up sleeping from 4:30pm almost straight thru until the next morning.   Subjective:   1. Hypertension associated with type 2 diabetes mellitus (HCC) Patient's blood pressure is very well-controlled today.  She denies dizziness or lightheadedness.  She is on irbesartan and Demadex.  2. Bilateral lower extremity edema Patient has bilateral lower extremity swelling even though the patient is taking diuretic.  No echocardiogram in the last 5 years.  Her blood pressure is lower range of normal.  3. Type 2 diabetes mellitus with hyperglycemia, without long-term current use of insulin (HCC) Patient has had a hard time finding Mounjaro.  Previously tolerated Ozempic.  4. Hyperlipidemia associated with type 2 diabetes mellitus (HCC) Patient is on statin, and she denies myalgias or transaminitis.  5. Vitamin D deficiency Patient's vitamin D level was previously low on prescription supplementation.  She notes fatigue.  Assessment/Plan:   1. Hypertension associated with type 2 diabetes mellitus (HCC) We will follow-up on her blood pressure at her next appointment.  -  ECHOCARDIOGRAM COMPLETE; Future  2. Bilateral lower extremity edema Referral for echocardiogram was ordered.  - ECHOCARDIOGRAM COMPLETE; Future  3. Type 2 diabetes mellitus with hyperglycemia, without long-term current use of insulin (HCC) We will check labs today.  Patient agreed to start [facility] due to drug shortage; and start Ozempic 1 mg subcu weekly with no refills.  - Semaglutide, 1 MG/DOSE, 4 MG/3ML SOPN; Inject 1 mg as directed once a week.  Dispense: 3 mL; Refill: 0 - Comprehensive metabolic panel - Hemoglobin A1c - Insulin, random  4. Hyperlipidemia associated with type 2 diabetes mellitus (HCC) We will check labs today, and we will follow-up at her next appointment.  - Lipid Panel With LDL/HDL Ratio  5. Vitamin D deficiency We will check labs today, and we will follow-up at her next appointment.  - VITAMIN D 25 Hydroxy (Vit-D Deficiency, Fractures)  6. BMI 50.0-59.9, adult (HCC)  7. Obesity with starting BMI of 57.1 Angel Watkins is currently in the action stage of change. As such, her goal is to continue with weight loss efforts. She has agreed to the Category 2 Plan.   Exercise goals: All adults should avoid inactivity. Some physical activity is better than none, and adults who participate in any amount of physical activity gain some health benefits.  Behavioral modification strategies: increasing lean protein intake, meal planning and cooking strategies, keeping healthy foods in the home, and planning for success.  [name] has agreed to follow-up with our clinic in 4 weeks. She was informed of the importance of frequent follow-up visits to maximize her success  with intensive lifestyle modifications for her multiple health conditions.   Angel Watkins was informed we would discuss her lab results at her next visit unless there is a critical issue that needs to be addressed sooner. Angel Watkins agreed to keep her next visit at the agreed upon time to discuss these results.  Objective:    Blood pressure 99/66, pulse 77, temperature 97.7 F (36.5 C), height 5\' 4"  (1.626 m), weight (!) 315 lb (142.9 kg), SpO2 99 %. Body mass index is 54.07 kg/m.  General: Cooperative, alert, well developed, in no acute distress. HEENT: Conjunctivae and lids unremarkable. Cardiovascular: Regular rhythm.  Lungs: Normal work of breathing. Neurologic: No focal deficits.   Lab Results  Component Value Date   CREATININE 0.98 06/28/2022   BUN 19 06/28/2022   NA 143 06/28/2022   K 4.2 06/28/2022   CL 104 06/28/2022   CO2 23 06/28/2022   Lab Results  Component Value Date   ALT 27 06/28/2022   AST 19 06/28/2022   ALKPHOS 110 06/28/2022   BILITOT 0.4 06/28/2022   Lab Results  Component Value Date   HGBA1C 5.4 06/28/2022   HGBA1C 5.8 (H) 03/15/2021   HGBA1C 5.9 (H) 12/02/2020   HGBA1C 5.4 07/30/2020   HGBA1C 6.5 (H) 04/22/2020   Lab Results  Component Value Date   INSULIN 11.6 06/28/2022   INSULIN 9.6 03/15/2021   INSULIN 11.1 12/02/2020   INSULIN 6.6 07/30/2020   INSULIN 10.8 04/22/2020   Lab Results  Component Value Date   TSH 1.670 04/22/2020   Lab Results  Component Value Date   CHOL 125 06/28/2022   HDL 59 06/28/2022   LDLCALC 52 06/28/2022   TRIG 66 06/28/2022   Lab Results  Component Value Date   VD25OH 45.7 06/28/2022   VD25OH 47.9 03/15/2021   VD25OH 45.0 12/02/2020   Lab Results  Component Value Date   WBC 7.4 04/22/2020   HGB 14.6 04/22/2020   HCT 43.1 04/22/2020   MCV 92 04/22/2020   PLT 351 04/22/2020   No results found for: "IRON", "TIBC", "FERRITIN"  Attestation Statements:   Reviewed by clinician on day of visit: allergies, medications, problem list, medical history, surgical history, family history, social history, and previous encounter notes.   I, Burt Knack, am acting as transcriptionist for Reuben Likes, MD.  I have reviewed the above documentation for accuracy and completeness, and I agree with the above. - Reuben Likes, MD

## 2022-06-29 LAB — COMPREHENSIVE METABOLIC PANEL
ALT: 27 IU/L (ref 0–32)
AST: 19 IU/L (ref 0–40)
Albumin/Globulin Ratio: 1.9 (ref 1.2–2.2)
Albumin: 3.8 g/dL (ref 3.8–4.9)
Alkaline Phosphatase: 110 IU/L (ref 44–121)
BUN/Creatinine Ratio: 19 (ref 9–23)
BUN: 19 mg/dL (ref 6–24)
Bilirubin Total: 0.4 mg/dL (ref 0.0–1.2)
CO2: 23 mmol/L (ref 20–29)
Calcium: 9.1 mg/dL (ref 8.7–10.2)
Chloride: 104 mmol/L (ref 96–106)
Creatinine, Ser: 0.98 mg/dL (ref 0.57–1.00)
Globulin, Total: 2 g/dL (ref 1.5–4.5)
Glucose: 90 mg/dL (ref 70–99)
Potassium: 4.2 mmol/L (ref 3.5–5.2)
Sodium: 143 mmol/L (ref 134–144)
Total Protein: 5.8 g/dL — ABNORMAL LOW (ref 6.0–8.5)
eGFR: 66 mL/min/{1.73_m2} (ref >59)

## 2022-06-29 LAB — LIPID PANEL WITH LDL/HDL RATIO
Cholesterol, Total: 125 mg/dL (ref 100–199)
HDL: 59 mg/dL (ref >39)
LDL Chol Calc (NIH): 52 mg/dL (ref 0–99)
LDL/HDL Ratio: 0.9 ratio (ref 0.0–3.2)
Triglycerides: 66 mg/dL (ref 0–149)
VLDL Cholesterol Cal: 14 mg/dL (ref 5–40)

## 2022-06-29 LAB — VITAMIN D 25 HYDROXY (VIT D DEFICIENCY, FRACTURES): Vit D, 25-Hydroxy: 45.7 ng/mL (ref 30.0–100.0)

## 2022-06-29 LAB — HEMOGLOBIN A1C
Est. average glucose Bld gHb Est-mCnc: 108 mg/dL
Hgb A1c MFr Bld: 5.4 % (ref 4.8–5.6)

## 2022-06-29 LAB — INSULIN, RANDOM: INSULIN: 11.6 u[IU]/mL (ref 2.6–24.9)

## 2022-07-26 ENCOUNTER — Ambulatory Visit (INDEPENDENT_AMBULATORY_CARE_PROVIDER_SITE_OTHER): Payer: 59 | Admitting: Family Medicine

## 2022-07-26 ENCOUNTER — Encounter (INDEPENDENT_AMBULATORY_CARE_PROVIDER_SITE_OTHER): Payer: Self-pay | Admitting: Family Medicine

## 2022-07-26 VITALS — BP 95/65 | HR 74 | Temp 98.1°F | Ht 64.0 in | Wt 314.0 lb

## 2022-07-26 DIAGNOSIS — E1159 Type 2 diabetes mellitus with other circulatory complications: Secondary | ICD-10-CM | POA: Diagnosis not present

## 2022-07-26 DIAGNOSIS — E1165 Type 2 diabetes mellitus with hyperglycemia: Secondary | ICD-10-CM | POA: Diagnosis not present

## 2022-07-26 DIAGNOSIS — I152 Hypertension secondary to endocrine disorders: Secondary | ICD-10-CM

## 2022-07-26 DIAGNOSIS — E559 Vitamin D deficiency, unspecified: Secondary | ICD-10-CM | POA: Diagnosis not present

## 2022-07-26 DIAGNOSIS — Z6841 Body Mass Index (BMI) 40.0 and over, adult: Secondary | ICD-10-CM

## 2022-07-26 DIAGNOSIS — Z7985 Long-term (current) use of injectable non-insulin antidiabetic drugs: Secondary | ICD-10-CM

## 2022-07-26 DIAGNOSIS — E669 Obesity, unspecified: Secondary | ICD-10-CM

## 2022-07-26 MED ORDER — SEMAGLUTIDE (1 MG/DOSE) 4 MG/3ML ~~LOC~~ SOPN: 1.0000 mg | PEN_INJECTOR | SUBCUTANEOUS | 0 refills | Status: DC

## 2022-07-26 MED ORDER — VITAMIN D (ERGOCALCIFEROL) 1.25 MG (50000 UNIT) PO CAPS
50000.0000 [IU] | ORAL_CAPSULE | ORAL | 0 refills | Status: DC
Start: 2022-07-26 — End: 2022-08-23

## 2022-07-26 NOTE — Progress Notes (Unsigned)
Chief Complaint:   OBESITY Angel Watkins is here to discuss her progress with her obesity treatment plan along with follow-up of her obesity related diagnoses. Angel Watkins is on the Category 2 Plan and states she is following her eating plan approximately 90% of the time. Angel Watkins states she has done a little walking.   Today's visit was #: 33 Starting weight: 333 lbs Starting date: 04/22/2020 Today's weight: 314 lbs Today's date: 07/26/2022 Total lbs lost to date: 19 Total lbs lost since last in-office visit: 1  Interim History: Since last appointment she felt she did really well following plan.  Sunday (2 days ago) she saw a 6lb loss and felt overjoyed. Review of bioimpedence strip shows loss of 3lbs.  She reached out to the [facility] for a possible revision and additional hernia repair.  She has gotten two messages from that clinic to schedule an appointment.  No plans for July 4th.    Subjective:   1. Hypertension associated with diabetes (HCC) Patient's blood pressure is in the lower range of normal.  She is on irbesartan and Demadex.  Echocardiogram scheduled for 07/28/2022.  She denies dizziness or lightheadedness.  2. Type 2 diabetes mellitus with hyperglycemia, without long-term current use of insulin (HCC) Patient is on Ozempic 1 mg weekly.  A1c improved from 5.8-5.4, and insulin is stable.  3. Vitamin D deficiency Patient's vitamin D level has been in the 40's for months.  She denies nausea, vomiting, or muscle weakness but notes fatigue.  Assessment/Plan:   1. Hypertension associated with diabetes (HCC) We will follow-up on patient's blood pressure at her next appointment after review of echo, may need medication management.  2. Type 2 diabetes mellitus with hyperglycemia, without long-term current use of insulin (HCC) We will refill Ozempic 1 mg for 1 month.  - Semaglutide, 1 MG/DOSE, 4 MG/3ML SOPN; Inject 1 mg as directed once a week.  Dispense: 3 mL; Refill:  0  3. Vitamin D deficiency Patient is to change from OTC vitamin D 5000 IU daily to prescription vitamin D 50,000 IU once weekly, with no refills.  - Vitamin D, Ergocalciferol, (DRISDOL) 1.25 MG (50000 UNIT) CAPS capsule; Take 1 capsule (50,000 Units total) by mouth every 7 (seven) days.  Dispense: 4 capsule; Refill: 0  4. BMI 50.0-59.9, adult (HCC)  5. Obesity with starting BMI of 57.1 Angel Watkins is currently in the action stage of change. As such, her goal is to continue with weight loss efforts. She has agreed to the Category 2 Plan.   Exercise goals: All adults should avoid inactivity. Some physical activity is better than none, and adults who participate in any amount of physical activity gain some health benefits.  Behavioral modification strategies: increasing lean protein intake, meal planning and cooking strategies, keeping healthy foods in the home, and planning for success.  Angel Watkins has agreed to follow-up with our clinic in 4 weeks. She was informed of the importance of frequent follow-up visits to maximize her success with intensive lifestyle modifications for her multiple health conditions.   Objective:   Blood pressure 95/65, pulse 74, temperature 98.1 F (36.7 C), height 5\' 4"  (1.626 m), weight (!) 314 lb (142.4 kg), SpO2 99 %. Body mass index is 53.9 kg/m.  General: Cooperative, alert, well developed, in no acute distress. HEENT: Conjunctivae and lids unremarkable. Cardiovascular: Regular rhythm.  Lungs: Normal work of breathing. Neurologic: No focal deficits.   Lab Results  Component Value Date   CREATININE 0.98 06/28/2022  BUN 19 06/28/2022   NA 143 06/28/2022   K 4.2 06/28/2022   CL 104 06/28/2022   CO2 23 06/28/2022   Lab Results  Component Value Date   ALT 27 06/28/2022   AST 19 06/28/2022   ALKPHOS 110 06/28/2022   BILITOT 0.4 06/28/2022   Lab Results  Component Value Date   HGBA1C 5.4 06/28/2022   HGBA1C 5.8 (H) 03/15/2021   HGBA1C 5.9 (H)  12/02/2020   HGBA1C 5.4 07/30/2020   HGBA1C 6.5 (H) 04/22/2020   Lab Results  Component Value Date   INSULIN 11.6 06/28/2022   INSULIN 9.6 03/15/2021   INSULIN 11.1 12/02/2020   INSULIN 6.6 07/30/2020   INSULIN 10.8 04/22/2020   Lab Results  Component Value Date   TSH 1.670 04/22/2020   Lab Results  Component Value Date   CHOL 125 06/28/2022   HDL 59 06/28/2022   LDLCALC 52 06/28/2022   TRIG 66 06/28/2022   Lab Results  Component Value Date   VD25OH 45.7 06/28/2022   VD25OH 47.9 03/15/2021   VD25OH 45.0 12/02/2020   Lab Results  Component Value Date   WBC 7.4 04/22/2020   HGB 14.6 04/22/2020   HCT 43.1 04/22/2020   MCV 92 04/22/2020   PLT 351 04/22/2020   No results found for: "IRON", "TIBC", "FERRITIN"  Attestation Statements:   Reviewed by clinician on day of visit: allergies, medications, problem list, medical history, surgical history, family history, social history, and previous encounter notes.   I, Burt Knack, am acting as transcriptionist for Reuben Likes, MD.  I have reviewed the above documentation for accuracy and completeness, and I agree with the above. - Reuben Likes, MD

## 2022-07-28 ENCOUNTER — Ambulatory Visit (HOSPITAL_COMMUNITY): Payer: 59 | Attending: Internal Medicine

## 2022-07-28 DIAGNOSIS — E1159 Type 2 diabetes mellitus with other circulatory complications: Secondary | ICD-10-CM | POA: Insufficient documentation

## 2022-07-28 DIAGNOSIS — I152 Hypertension secondary to endocrine disorders: Secondary | ICD-10-CM | POA: Diagnosis not present

## 2022-07-28 DIAGNOSIS — R600 Localized edema: Secondary | ICD-10-CM

## 2022-07-28 LAB — ECHOCARDIOGRAM COMPLETE
Area-P 1/2: 4.02 cm2
S' Lateral: 2.9 cm

## 2022-08-23 ENCOUNTER — Ambulatory Visit (INDEPENDENT_AMBULATORY_CARE_PROVIDER_SITE_OTHER): Payer: 59 | Admitting: Family Medicine

## 2022-08-23 ENCOUNTER — Encounter (INDEPENDENT_AMBULATORY_CARE_PROVIDER_SITE_OTHER): Payer: Self-pay | Admitting: Family Medicine

## 2022-08-23 VITALS — BP 91/63 | HR 72 | Temp 97.9°F | Ht 64.0 in | Wt 300.0 lb

## 2022-08-23 DIAGNOSIS — G8929 Other chronic pain: Secondary | ICD-10-CM

## 2022-08-23 DIAGNOSIS — E669 Obesity, unspecified: Secondary | ICD-10-CM

## 2022-08-23 DIAGNOSIS — E559 Vitamin D deficiency, unspecified: Secondary | ICD-10-CM | POA: Diagnosis not present

## 2022-08-23 DIAGNOSIS — Z6841 Body Mass Index (BMI) 40.0 and over, adult: Secondary | ICD-10-CM

## 2022-08-23 DIAGNOSIS — E1165 Type 2 diabetes mellitus with hyperglycemia: Secondary | ICD-10-CM

## 2022-08-23 DIAGNOSIS — Z7985 Long-term (current) use of injectable non-insulin antidiabetic drugs: Secondary | ICD-10-CM

## 2022-08-23 MED ORDER — BUPRENORPHINE 10 MCG/HR TD PTWK
1.0000 | MEDICATED_PATCH | TRANSDERMAL | Status: AC
Start: 2022-08-23 — End: ?

## 2022-08-23 MED ORDER — SEMAGLUTIDE (1 MG/DOSE) 4 MG/3ML ~~LOC~~ SOPN: 1.0000 mg | PEN_INJECTOR | SUBCUTANEOUS | 0 refills | Status: DC

## 2022-08-23 MED ORDER — VITAMIN D (ERGOCALCIFEROL) 1.25 MG (50000 UNIT) PO CAPS
50000.0000 [IU] | ORAL_CAPSULE | ORAL | 0 refills | Status: DC
Start: 2022-08-23 — End: 2022-09-27

## 2022-08-23 NOTE — Progress Notes (Signed)
Chief Complaint:   OBESITY Angel Watkins is here to discuss her progress with her obesity treatment plan along with follow-up of her obesity related diagnoses. Angel Watkins is on the Category 2 Plan and states she is following her eating plan approximately 90% of the time. Angel Watkins states she is doing 0 minutes 0 times per week.  Today's visit was #: 34 Starting weight: 333 lbs Starting date: 04/22/2020 Today's weight: 300 lbs Today's date: 08/23/2022 Total lbs lost to date: 33 Total lbs lost since last in-office visit: 14  Interim History: She has been focusing on ensuring she got something in- at least protein- at every meal.  For July 4th she went to her aunt's house.  Mentions that once again breakfast and lunch are relatively easy to follow meal plan but dinner still tends to be a struggle.  Work continues to be stress invoking.  Has a few upcoming events that will require a bit more walking than she normally does.  Subjective:   1. Vitamin D deficiency Patient is on prescription Vitamin D. She denies nausea, vomiting, or muscle weakness but notes fatigue.   2. Type 2 diabetes mellitus with hyperglycemia, without long-term current use of insulin (HCC) Patient is on Ozempic weekly (1 mg). Last A1c was 5.4 and insulin 11.6.  3. Other chronic pain Patient complains of chronic pain.   Assessment/Plan:   1. Vitamin D deficiency Patient will continue prescription Vitamin D once weekly, and we will refill for 1 month.   - Vitamin D, Ergocalciferol, (DRISDOL) 1.25 MG (50000 UNIT) CAPS capsule; Take 1 capsule (50,000 Units total) by mouth every 7 (seven) days.  Dispense: 4 capsule; Refill: 0  2. Type 2 diabetes mellitus with hyperglycemia, without long-term current use of insulin (HCC) Patient will continue Ozempic 1 mg once weekly, and we will refill for 90 days.   - Semaglutide, 1 MG/DOSE, 4 MG/3ML SOPN; Inject 1 mg as directed once a week.  Dispense: 9 mL; Refill: 0  3. Other chronic  pain Patient agreed to start buprenorphine 10 mcg 1 patch once weekly with no refills. We will follow-up at her next appointment.   - buprenorphine (BUTRANS) 10 MCG/HR PTWK; Place 1 patch onto the skin once a week.  4. BMI 50.0-59.9, adult (HCC)  5. Obesity with starting BMI of 57.1 Angel Watkins is currently in the action stage of change. As such, her goal is to continue with weight loss efforts. She has agreed to the Category 2 Plan.   Exercise goals: All adults should avoid inactivity. Some physical activity is better than none, and adults who participate in any amount of physical activity gain some health benefits. Patient to start doing more quad strengthening exercises.   Behavioral modification strategies: increasing lean protein intake, meal planning and cooking strategies, better snacking choices, and planning for success.  Angel Watkins has agreed to follow-up with our clinic in 4 to 5 weeks. She was informed of the importance of frequent follow-up visits to maximize her success with intensive lifestyle modifications for her multiple health conditions.   Objective:   Blood pressure 91/63, pulse 72, temperature 97.9 F (36.6 C), height 5\' 4"  (1.626 m), weight 300 lb (136.1 kg), SpO2 99%. Body mass index is 51.49 kg/m.  General: Cooperative, alert, well developed, in no acute distress. HEENT: Conjunctivae and lids unremarkable. Cardiovascular: Regular rhythm.  Lungs: Normal work of breathing. Neurologic: No focal deficits.   Lab Results  Component Value Date   CREATININE 0.98 06/28/2022   BUN  19 06/28/2022   NA 143 06/28/2022   K 4.2 06/28/2022   CL 104 06/28/2022   CO2 23 06/28/2022   Lab Results  Component Value Date   ALT 27 06/28/2022   AST 19 06/28/2022   ALKPHOS 110 06/28/2022   BILITOT 0.4 06/28/2022   Lab Results  Component Value Date   HGBA1C 5.4 06/28/2022   HGBA1C 5.8 (H) 03/15/2021   HGBA1C 5.9 (H) 12/02/2020   HGBA1C 5.4 07/30/2020   HGBA1C 6.5 (H) 04/22/2020    Lab Results  Component Value Date   INSULIN 11.6 06/28/2022   INSULIN 9.6 03/15/2021   INSULIN 11.1 12/02/2020   INSULIN 6.6 07/30/2020   INSULIN 10.8 04/22/2020   Lab Results  Component Value Date   TSH 1.670 04/22/2020   Lab Results  Component Value Date   CHOL 125 06/28/2022   HDL 59 06/28/2022   LDLCALC 52 06/28/2022   TRIG 66 06/28/2022   Lab Results  Component Value Date   VD25OH 45.7 06/28/2022   VD25OH 47.9 03/15/2021   VD25OH 45.0 12/02/2020   Lab Results  Component Value Date   WBC 7.4 04/22/2020   HGB 14.6 04/22/2020   HCT 43.1 04/22/2020   MCV 92 04/22/2020   PLT 351 04/22/2020   No results found for: "IRON", "TIBC", "FERRITIN"  Attestation Statements:   Reviewed by clinician on day of visit: allergies, medications, problem list, medical history, surgical history, family history, social history, and previous encounter notes.   I, Burt Knack, am acting as transcriptionist for Reuben Likes, MD.  I have reviewed the above documentation for accuracy and completeness, and I agree with the above. - Reuben Likes, MD

## 2022-08-24 ENCOUNTER — Telehealth (INDEPENDENT_AMBULATORY_CARE_PROVIDER_SITE_OTHER): Payer: Self-pay

## 2022-08-24 NOTE — Telephone Encounter (Signed)
Prior auth submitted for Ozempic.  Waiting determination.

## 2022-08-25 ENCOUNTER — Encounter (INDEPENDENT_AMBULATORY_CARE_PROVIDER_SITE_OTHER): Payer: Self-pay

## 2022-08-25 NOTE — Telephone Encounter (Signed)
Your request for Ozempic Inj 4mg /1ml has been approved. How long does this approval last? OZEMPIC INJ 4MG /3ML, use as directed (9ml per 84 days), is approved for 12 months through 07/24/ 2025 or until coverage for the medication is no longer available under your benefit plan or the medication becomes subject to a pharmacy benefit coverage requirement, such as supply limits or notification, whichever occurs first as allowed by law.  Patient has been notified.

## 2022-09-13 ENCOUNTER — Ambulatory Visit (INDEPENDENT_AMBULATORY_CARE_PROVIDER_SITE_OTHER): Payer: 59 | Admitting: Family Medicine

## 2022-09-13 HISTORY — PX: UPPER GI ENDOSCOPY: SHX6162

## 2022-09-27 ENCOUNTER — Ambulatory Visit (INDEPENDENT_AMBULATORY_CARE_PROVIDER_SITE_OTHER): Payer: 59 | Admitting: Family Medicine

## 2022-09-27 ENCOUNTER — Encounter (INDEPENDENT_AMBULATORY_CARE_PROVIDER_SITE_OTHER): Payer: Self-pay | Admitting: Family Medicine

## 2022-09-27 VITALS — BP 103/67 | HR 62 | Temp 98.3°F | Ht 64.0 in | Wt 317.0 lb

## 2022-09-27 DIAGNOSIS — E559 Vitamin D deficiency, unspecified: Secondary | ICD-10-CM | POA: Diagnosis not present

## 2022-09-27 DIAGNOSIS — Z6841 Body Mass Index (BMI) 40.0 and over, adult: Secondary | ICD-10-CM | POA: Diagnosis not present

## 2022-09-27 DIAGNOSIS — E669 Obesity, unspecified: Secondary | ICD-10-CM | POA: Diagnosis not present

## 2022-09-27 DIAGNOSIS — R600 Localized edema: Secondary | ICD-10-CM

## 2022-09-27 MED ORDER — VITAMIN D (ERGOCALCIFEROL) 1.25 MG (50000 UNIT) PO CAPS
50000.0000 [IU] | ORAL_CAPSULE | ORAL | 0 refills | Status: AC
Start: 2022-09-27 — End: ?

## 2022-09-27 NOTE — Progress Notes (Signed)
Chief Complaint:   OBESITY Angel Watkins is here to discuss her progress with her obesity treatment plan along with follow-up of her obesity related diagnoses. Angel Watkins is on the Category 2 Plan and states she is following her eating plan approximately 60% of the time. Angel Watkins states she is exercising and walking for 30 minutes 2 times per week.  Today's visit was #: 35 Starting weight: 333 lbs Starting date: 04/22/2020 Today's weight: 317 lbs Today's date: 09/27/2022 Total lbs lost to date: 16 Total lbs lost since last in-office visit: 0  Interim History: Patient was seen by Cathlean Cower for consultation of revision of bariatric surgery.  She was given two options of revision to distal bypass or revision to BPDDS.  She has many questions about her options about what likelihood of attaining weight loss goal.  She and her husband just put an offer in on a house in [place] so she will likely need a new primary care physician when she moves.  She noticed a 16lb weight gain a few days after her last appointment and is attributing this to fluid retention. She would like to go back to the original meal plan.  Subjective:   1. Vitamin D deficiency Patient is on prescription vitamin D.  She denies nausea, vomiting, or muscle weakness but notes fatigue.  Her last vitamin D level was of 45.7.  2. Bilateral lower extremity edema Patient is on torsemide 20 mg daily.  Kidney function within normal limits on last labs in May.  Assessment/Plan:   1. Vitamin D deficiency We will refill prescription vitamin D 50,000 IU once weekly for 1 month.  - Vitamin D, Ergocalciferol, (DRISDOL) 1.25 MG (50000 UNIT) CAPS capsule; Take 1 capsule (50,000 Units total) by mouth every 7 (seven) days.  Dispense: 4 capsule; Refill: 0  2. Bilateral lower extremity edema Will plan to increase torsemide at patient's next appointment when follow-up can be more consistent.  3. BMI 50.0-59.9, adult (HCC)  4. Obesity with  starting BMI of 57.1 Angel Watkins is currently in the action stage of change. As such, her goal is to continue with weight loss efforts. She has agreed to the [employer].   Exercise goals: All adults should avoid inactivity. Some physical activity is better than none, and adults who participate in any amount of physical activity gain some health benefits.  Behavioral modification strategies: increasing lean protein intake, meal planning and cooking strategies, keeping healthy foods in the home, and planning for success.  Angel Watkins has agreed to follow-up with our clinic in 5 weeks. She was informed of the importance of frequent follow-up visits to maximize her success with intensive lifestyle modifications for her multiple health conditions.   Objective:   Blood pressure 103/67, pulse 62, temperature 98.3 F (36.8 C), height 5\' 4"  (1.626 m), weight (!) 317 lb (143.8 kg), SpO2 100%. Body mass index is 54.41 kg/m.  General: Cooperative, alert, well developed, in no acute distress. HEENT: Conjunctivae and lids unremarkable. Cardiovascular: Regular rhythm.  Lungs: Normal work of breathing. Neurologic: No focal deficits.   Lab Results  Component Value Date   CREATININE 0.98 06/28/2022   BUN 19 06/28/2022   NA 143 06/28/2022   K 4.2 06/28/2022   CL 104 06/28/2022   CO2 23 06/28/2022   Lab Results  Component Value Date   ALT 27 06/28/2022   AST 19 06/28/2022   ALKPHOS 110 06/28/2022   BILITOT 0.4 06/28/2022   Lab Results  Component Value Date  HGBA1C 5.4 06/28/2022   HGBA1C 5.8 (H) 03/15/2021   HGBA1C 5.9 (H) 12/02/2020   HGBA1C 5.4 07/30/2020   HGBA1C 6.5 (H) 04/22/2020   Lab Results  Component Value Date   INSULIN 11.6 06/28/2022   INSULIN 9.6 03/15/2021   INSULIN 11.1 12/02/2020   INSULIN 6.6 07/30/2020   INSULIN 10.8 04/22/2020   Lab Results  Component Value Date   TSH 1.670 04/22/2020   Lab Results  Component Value Date   CHOL 125 06/28/2022   HDL 59  06/28/2022   LDLCALC 52 06/28/2022   TRIG 66 06/28/2022   Lab Results  Component Value Date   VD25OH 45.7 06/28/2022   VD25OH 47.9 03/15/2021   VD25OH 45.0 12/02/2020   Lab Results  Component Value Date   WBC 7.4 04/22/2020   HGB 14.6 04/22/2020   HCT 43.1 04/22/2020   MCV 92 04/22/2020   PLT 351 04/22/2020   No results found for: "IRON", "TIBC", "FERRITIN"  Attestation Statements:   Reviewed by clinician on day of visit: allergies, medications, problem list, medical history, surgical history, family history, social history, and previous encounter notes.    I, Burt Knack, am acting as transcriptionist for Reuben Likes, MD.  I have reviewed the above documentation for accuracy and completeness, and I agree with the above. - Reuben Likes, MD

## 2022-10-27 ENCOUNTER — Other Ambulatory Visit (INDEPENDENT_AMBULATORY_CARE_PROVIDER_SITE_OTHER): Payer: Self-pay | Admitting: Family Medicine

## 2022-10-27 DIAGNOSIS — E559 Vitamin D deficiency, unspecified: Secondary | ICD-10-CM

## 2022-11-01 ENCOUNTER — Ambulatory Visit (INDEPENDENT_AMBULATORY_CARE_PROVIDER_SITE_OTHER): Payer: 59 | Admitting: Family Medicine

## 2022-11-01 ENCOUNTER — Telehealth: Payer: Self-pay

## 2022-11-01 VITALS — BP 121/75 | HR 66 | Temp 97.7°F | Ht 64.0 in | Wt 317.0 lb

## 2022-11-01 DIAGNOSIS — F419 Anxiety disorder, unspecified: Secondary | ICD-10-CM | POA: Diagnosis not present

## 2022-11-01 DIAGNOSIS — E669 Obesity, unspecified: Secondary | ICD-10-CM

## 2022-11-01 DIAGNOSIS — E559 Vitamin D deficiency, unspecified: Secondary | ICD-10-CM | POA: Diagnosis not present

## 2022-11-01 DIAGNOSIS — Z6841 Body Mass Index (BMI) 40.0 and over, adult: Secondary | ICD-10-CM

## 2022-11-01 DIAGNOSIS — F32A Depression, unspecified: Secondary | ICD-10-CM

## 2022-11-01 DIAGNOSIS — Z7985 Long-term (current) use of injectable non-insulin antidiabetic drugs: Secondary | ICD-10-CM

## 2022-11-01 DIAGNOSIS — E1165 Type 2 diabetes mellitus with hyperglycemia: Secondary | ICD-10-CM | POA: Diagnosis not present

## 2022-11-01 MED ORDER — SERTRALINE HCL 50 MG PO TABS
50.0000 mg | ORAL_TABLET | Freq: Every day | ORAL | 0 refills | Status: DC
Start: 2022-11-01 — End: 2022-12-06

## 2022-11-01 MED ORDER — SEMAGLUTIDE (1 MG/DOSE) 4 MG/3ML ~~LOC~~ SOPN
1.0000 mg | PEN_INJECTOR | SUBCUTANEOUS | 0 refills | Status: DC
Start: 2022-11-01 — End: 2022-12-06

## 2022-11-01 MED ORDER — VITAMIN D (ERGOCALCIFEROL) 1.25 MG (50000 UNIT) PO CAPS
50000.0000 [IU] | ORAL_CAPSULE | ORAL | 0 refills | Status: DC
Start: 2022-11-01 — End: 2022-12-06
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
## 2022-11-01 NOTE — Telephone Encounter (Signed)
PA started for Ozempic via covermymeds.

## 2022-12-06 ENCOUNTER — Ambulatory Visit (INDEPENDENT_AMBULATORY_CARE_PROVIDER_SITE_OTHER): Payer: 59 | Admitting: Family Medicine

## 2022-12-06 ENCOUNTER — Encounter (INDEPENDENT_AMBULATORY_CARE_PROVIDER_SITE_OTHER): Payer: Self-pay | Admitting: Family Medicine

## 2022-12-06 VITALS — BP 96/65 | HR 99 | Temp 98.2°F | Ht 64.0 in | Wt 313.0 lb

## 2022-12-06 DIAGNOSIS — E559 Vitamin D deficiency, unspecified: Secondary | ICD-10-CM | POA: Diagnosis not present

## 2022-12-06 DIAGNOSIS — F32A Depression, unspecified: Secondary | ICD-10-CM | POA: Diagnosis not present

## 2022-12-06 DIAGNOSIS — E1165 Type 2 diabetes mellitus with hyperglycemia: Secondary | ICD-10-CM | POA: Diagnosis not present

## 2022-12-06 DIAGNOSIS — F419 Anxiety disorder, unspecified: Secondary | ICD-10-CM | POA: Diagnosis not present

## 2022-12-06 DIAGNOSIS — Z7985 Long-term (current) use of injectable non-insulin antidiabetic drugs: Secondary | ICD-10-CM

## 2022-12-06 DIAGNOSIS — Z6841 Body Mass Index (BMI) 40.0 and over, adult: Secondary | ICD-10-CM

## 2022-12-06 MED ORDER — VITAMIN D (ERGOCALCIFEROL) 1.25 MG (50000 UNIT) PO CAPS
50000.0000 [IU] | ORAL_CAPSULE | ORAL | 0 refills | Status: DC
Start: 2022-12-06 — End: 2023-01-03

## 2022-12-06 MED ORDER — SEMAGLUTIDE (1 MG/DOSE) 4 MG/3ML ~~LOC~~ SOPN
1.0000 mg | PEN_INJECTOR | SUBCUTANEOUS | 0 refills | Status: DC
Start: 2022-12-06 — End: 2023-04-12

## 2022-12-06 MED ORDER — SERTRALINE HCL 50 MG PO TABS
50.0000 mg | ORAL_TABLET | Freq: Every day | ORAL | 0 refills | Status: DC
Start: 2022-12-06 — End: 2023-03-08

## 2022-12-06 NOTE — Assessment & Plan Note (Signed)
Last lab done in May and was not at goal yet.  She needs refill today.  She is getting a repeat lab today.

## 2022-12-06 NOTE — Progress Notes (Deleted)
[facility] MEDICAL WEIGHT [facility] HEALTHY WEIGHT & WELLNESS AT [place] [address] [place] [address] Dept: [phone] Dept Fax: [phone]  SUBJECTIVE:  Chief Complaint: Obesity  Interim History: ***  Angel Watkins is here to discuss her progress with her obesity treatment plan. She is on the Category 2 Plan and states she is following her eating plan approximately 60% of the time. She states she is moving. homes   OBJECTIVE: Visit Diagnoses: Problem List Items Addressed This Visit   None   Vitals Temp: 98.2 F (36.8 C) BP: 96/65 Pulse Rate: 99 SpO2: 99 %   Anthropometric Measurements Height: 5\' 4"  (1.626 m) Weight: (!) 313 lb (142 kg) BMI (Calculated): 53.7 Weight at Last Visit: 317 lb Weight Lost Since Last Visit: 4 Weight Gained Since Last Visit: 0 Starting Weight: 333 lb Total Weight Loss (lbs): 16 lb (7.258 kg)   Body Composition  Body Fat %: 65.1 % Fat Mass (lbs): 204 lbs Muscle Mass (lbs): 103.8 lbs Visceral Fat Rating : 27   Other Clinical Data Fasting: yes Labs: yes Today's Visit #: 37 Starting Date: 04/07/20     ASSESSMENT AND PLAN:  Diet: Angel Watkins {CHL AMB IS/IS NOT:210130109} currently in the action stage of change. As such, her goal is to {HWW Weight Loss Efforts:210964006}. She {HAS HAS SAY:30160} agreed to {HWW Weight Loss Plan:210964005}.  Exercise: Angel Watkins has been instructed {HWW Exercise:210964007} for weight loss and overall health benefits.   Behavior Modification:  We discussed the following Behavioral Modification Strategies today: {HWW Behavior Modification:210964008}. We discussed various medication options to help Angel Watkins with her weight loss efforts and we both agreed to ***.  No follow-ups on file.[name] She was informed of the importance of frequent follow up visits to maximize her success with intensive lifestyle modifications for her multiple health conditions.  Attestation Statements:   Reviewed  by clinician on day of visit: allergies, medications, problem list, medical history, surgical history, family history, social history, and previous encounter notes.   Time spent on visit including pre-visit chart review and post-visit care and charting was *** minutes.    Kirke Corin

## 2022-12-06 NOTE — Progress Notes (Signed)
[facility] MEDICAL WEIGHT [facility] HEALTHY WEIGHT & WELLNESS AT [place] [address] [place] [address] Dept: [phone] Dept Fax: [phone]  SUBJECTIVE:  Chief Complaint: Obesity  Interim History: Patient is in the process of moving and is still trying to establish a routine.  The house is bigger than she is used to and she is still figuring out a flow.  She mentions today she forgot her lunch which is unusual for her. She is fasting today for labs.   Angel Watkins is here to discuss her progress with her obesity treatment plan. She is on the Category 2 Plan and states she is following her eating plan approximately 60 % of the time. She states she is moving more due to moving houses.    OBJECTIVE: Visit Diagnoses: Problem List Items Addressed This Visit       Endocrine   Type 2 diabetes mellitus with hyperglycemia, without long-term current use of insulin (HCC)    She is on Ozempic.  Last labs done in May.  She is not experiencing any significant GI symptoms on Ozempic but she is experiencing early satiety. She is on the 1mg  dose and needs a refill today.  Labs ordered today.       Relevant Medications   Semaglutide, 1 MG/DOSE, 4 MG/3ML SOPN   Other Relevant Orders   Comprehensive metabolic panel   Hemoglobin A1c   Insulin, random   Lipid Panel With LDL/HDL Ratio     Other   Vitamin D deficiency    Last lab done in May and was not at goal yet.  She needs refill today.  She is getting a repeat lab today.       Relevant Medications   Vitamin D, Ergocalciferol, (DRISDOL) 1.25 MG (50000 UNIT) CAPS capsule   Other Relevant Orders   VITAMIN D 25 Hydroxy (Vit-D Deficiency, Fractures)   Anxiety and depression    Patient started Zoloft and is already feeling an improvement in symptoms.  She denies homicidal ideation and suicidal ideation. She needs a refill today and I will refill it for 90 days given that we are entering the holiday season which  historically is a more difficult time of the year.      Relevant Medications   sertraline (ZOLOFT) 50 MG tablet    Vitals Temp: 98.2 F (36.8 C) BP: 96/65 Pulse Rate: 99 SpO2: 99 %   Anthropometric Measurements Height: 5\' 4"  (1.626 m) Weight: (!) 313 lb (142 kg) BMI (Calculated): 53.7 Weight at Last Visit: 317 lb Weight Lost Since Last Visit: 4 Weight Gained Since Last Visit: 0 Starting Weight: 333 lb Total Weight Loss (lbs): 16 lb (7.258 kg)   Body Composition  Body Fat %: 65.1 % Fat Mass (lbs): 204 lbs Muscle Mass (lbs): 103.8 lbs Visceral Fat Rating : 27   Other Clinical Data Fasting: yes Labs: yes Today's Visit #: 37 Starting Date: 04/07/20     ASSESSMENT AND PLAN:  Diet: Geet is currently in the action stage of change. As such, her goal is to continue with weight loss efforts. She has agreed to Category 2 Plan.  Exercise: Saran has been instructed that some exercise is better than none for weight loss and overall health benefits.   Behavior Modification:  We discussed the following Behavioral Modification Strategies today: increasing lean protein intake, increasing vegetables, and no skipping meals.   No follow-ups on file.[name] She was informed of the importance of frequent follow up visits to maximize her  success with intensive lifestyle modifications for her multiple health conditions.  Attestation Statements:   Reviewed by clinician on day of visit: allergies, medications, problem list, medical history, surgical history, family history, social history, and previous encounter notes.      Rudean Hitt, MD

## 2022-12-06 NOTE — Assessment & Plan Note (Signed)
Patient started Zoloft and is already feeling an improvement in symptoms.  She denies homicidal ideation and suicidal ideation. She needs a refill today and I will refill it for 90 days given that we are entering the holiday season which historically is a more difficult time of the year.

## 2022-12-06 NOTE — Progress Notes (Deleted)
[facility] MEDICAL WEIGHT [facility] HEALTHY WEIGHT & WELLNESS AT [place] [address] [place] [address] Dept: [phone] Dept Fax: [phone]  SUBJECTIVE:  Chief Complaint: Obesity  Interim History: Patient is in the process of moving and is still trying to establish a routine.  The house is bigger than she is used to and she is still figuring out a flow.  She mentions today she forgot her lunch which is unusual for her. She is fasting today for labs.   Angel Watkins is here to discuss her progress with her obesity treatment plan. She is on the Category 2 Plan and states she is following her eating plan approximately 60 % of the time. She states she is moving more due to moving houses.    OBJECTIVE: Visit Diagnoses: Problem List Items Addressed This Visit       Endocrine   Type 2 diabetes mellitus with hyperglycemia, without long-term current use of insulin (HCC)    She is on Ozempic.  Last labs done in May.  She is not experiencing any significant GI symptoms on Ozempic but she is experiencing early satiety. She is on the 1mg  dose and needs a refill today.  Labs ordered today.       Relevant Medications   Semaglutide, 1 MG/DOSE, 4 MG/3ML SOPN   Other Relevant Orders   Comprehensive metabolic panel   Hemoglobin A1c   Insulin, random   Lipid Panel With LDL/HDL Ratio     Other   Vitamin D deficiency    Last lab done in May and was not at goal yet.  She needs refill today.  She is getting a repeat lab today.       Relevant Medications   Vitamin D, Ergocalciferol, (DRISDOL) 1.25 MG (50000 UNIT) CAPS capsule   Other Relevant Orders   VITAMIN D 25 Hydroxy (Vit-D Deficiency, Fractures)   Anxiety and depression - Primary    Patient started Zoloft and is already feeling an improvement in symptoms.  She denies homicidal ideation and suicidal ideation. She needs a refill today and I will refill it for 90 days given that we are entering the holiday season which  historically is a more difficult time of the year.      Relevant Medications   sertraline (ZOLOFT) 50 MG tablet   Other Visit Diagnoses     Obesity with starting BMI of 57.1       Relevant Medications   Semaglutide, 1 MG/DOSE, 4 MG/3ML SOPN   BMI 50.0-59.9, adult (HCC)       Relevant Medications   Semaglutide, 1 MG/DOSE, 4 MG/3ML SOPN       Vitals Temp: 98.2 F (36.8 C) BP: 96/65 Pulse Rate: 99 SpO2: 99 %   Anthropometric Measurements Height: 5\' 4"  (1.626 m) Weight: (!) 313 lb (142 kg) BMI (Calculated): 53.7 Weight at Last Visit: 317 lb Weight Lost Since Last Visit: 4 Weight Gained Since Last Visit: 0 Starting Weight: 333 lb Total Weight Loss (lbs): 16 lb (7.258 kg)   Body Composition  Body Fat %: 65.1 % Fat Mass (lbs): 204 lbs Muscle Mass (lbs): 103.8 lbs Visceral Fat Rating : 27   Other Clinical Data Fasting: yes Labs: yes Today's Visit #: 37 Starting Date: 04/07/20     ASSESSMENT AND PLAN:  Diet: [name] is currently in the action stage of change. As such, her goal is to continue with weight loss efforts. She has agreed to Category 2 Plan.  Exercise: [name] has been  instructed that some exercise is better than none for weight loss and overall health benefits.   Behavior Modification:  We discussed the following Behavioral Modification Strategies today: increasing lean protein intake, increasing vegetables, and no skipping meals.   No follow-ups on file.[name] She was informed of the importance of frequent follow up visits to maximize her success with intensive lifestyle modifications for her multiple health conditions.  Attestation Statements:   Reviewed by clinician on day of visit: allergies, medications, problem list, medical history, surgical history, family history, social history, and previous encounter notes.      Rudean Hitt, MD

## 2022-12-06 NOTE — Assessment & Plan Note (Signed)
She is on Ozempic.  Last labs done in May.  She is not experiencing any significant GI symptoms on Ozempic but she is experiencing early satiety. She is on the 1mg  dose and needs a refill today.  Labs ordered today.

## 2022-12-07 LAB — INSULIN, RANDOM: INSULIN: 9.7 u[IU]/mL (ref 2.6–24.9)

## 2022-12-07 LAB — COMPREHENSIVE METABOLIC PANEL
ALT: 16 [IU]/L (ref 0–32)
AST: 12 [IU]/L (ref 0–40)
Albumin: 3.6 g/dL — ABNORMAL LOW (ref 3.8–4.9)
Alkaline Phosphatase: 87 [IU]/L (ref 44–121)
BUN/Creatinine Ratio: 17 (ref 12–28)
BUN: 15 mg/dL (ref 8–27)
Bilirubin Total: 0.7 mg/dL (ref 0.0–1.2)
CO2: 28 mmol/L (ref 20–29)
Calcium: 9 mg/dL (ref 8.7–10.3)
Chloride: 103 mmol/L (ref 96–106)
Creatinine, Ser: 0.87 mg/dL (ref 0.57–1.00)
Globulin, Total: 2.1 g/dL (ref 1.5–4.5)
Glucose: 87 mg/dL (ref 70–99)
Potassium: 4.4 mmol/L (ref 3.5–5.2)
Sodium: 144 mmol/L (ref 134–144)
Total Protein: 5.7 g/dL — ABNORMAL LOW (ref 6.0–8.5)
eGFR: 76 mL/min/{1.73_m2} (ref >59)

## 2022-12-07 LAB — HEMOGLOBIN A1C
Est. average glucose Bld gHb Est-mCnc: 114 mg/dL
Hgb A1c MFr Bld: 5.6 % (ref 4.8–5.6)

## 2022-12-07 LAB — LIPID PANEL WITH LDL/HDL RATIO
Cholesterol, Total: 128 mg/dL (ref 100–199)
HDL: 60 mg/dL (ref >39)
LDL Chol Calc (NIH): 55 mg/dL (ref 0–99)
LDL/HDL Ratio: 0.9 ratio (ref 0.0–3.2)
Triglycerides: 59 mg/dL (ref 0–149)
VLDL Cholesterol Cal: 13 mg/dL (ref 5–40)

## 2022-12-07 LAB — VITAMIN D 25 HYDROXY (VIT D DEFICIENCY, FRACTURES): Vit D, 25-Hydroxy: 66.8 ng/mL (ref 30.0–100.0)

## 2023-01-03 ENCOUNTER — Ambulatory Visit (INDEPENDENT_AMBULATORY_CARE_PROVIDER_SITE_OTHER): Payer: 59 | Admitting: Family Medicine

## 2023-01-03 ENCOUNTER — Encounter (INDEPENDENT_AMBULATORY_CARE_PROVIDER_SITE_OTHER): Payer: Self-pay | Admitting: Family Medicine

## 2023-01-03 VITALS — BP 114/73 | HR 66 | Temp 97.6°F | Ht 64.0 in | Wt 313.0 lb

## 2023-01-03 DIAGNOSIS — E559 Vitamin D deficiency, unspecified: Secondary | ICD-10-CM | POA: Diagnosis not present

## 2023-01-03 DIAGNOSIS — Z6841 Body Mass Index (BMI) 40.0 and over, adult: Secondary | ICD-10-CM | POA: Diagnosis not present

## 2023-01-03 DIAGNOSIS — Z7985 Long-term (current) use of injectable non-insulin antidiabetic drugs: Secondary | ICD-10-CM

## 2023-01-03 DIAGNOSIS — E1165 Type 2 diabetes mellitus with hyperglycemia: Secondary | ICD-10-CM | POA: Diagnosis not present

## 2023-01-03 MED ORDER — VITAMIN D3 125 MCG (5000 UT) PO CAPS: 5000.0000 [IU] | ORAL_CAPSULE | Freq: Every day | ORAL | Status: AC

## 2023-01-03 NOTE — Assessment & Plan Note (Addendum)
Lab done at last appointment and was at goal of 66.  She was previously on prescription Vitamin D supplementation.  She is still experiencing some fatigue but voices she is struggling emotionally currently.  Stop Rx Vitamin D and start daily OTC.  Patient to start [facility] international unit daily

## 2023-01-03 NOTE — Progress Notes (Signed)
   SUBJECTIVE:  Chief Complaint: Obesity  Interim History: Patient had a great Thanksgiving.  She thinks she ate more than she has previously.  She feels like she has been smart about it.  She has had to take more time to eat.  She is finding it difficult to care about whether or not she will even eat. She is not as faithful to her breakfast as she was.  She is eating lunch and she still skips dinner frequently.  She is feeling more depressed than she was previously.  She is struggling emotionally.   Angel Watkins is here to discuss her progress with her obesity treatment plan. She is on the Category 2 Plan and states she is following her eating plan approximately 60-65 % of the time. She states she is not exercising.   OBJECTIVE: Visit Diagnoses: Problem List Items Addressed This Visit       Endocrine   Type 2 diabetes mellitus with hyperglycemia, without long-term current use of insulin (HCC)    Recent A1c of 5.6.  This is slightly increased from previously.  Her insulin level was significantly improved from prior lab draw.  She is to stay on same dose Ozempic and does not need a refill at this time.        Other   Vitamin D deficiency - Primary    Lab done at last appointment and was at goal of 22.  She was previously on prescription Vitamin D supplementation.  She is still experiencing some fatigue but voices she is struggling emotionally currently.  Stop Rx Vitamin D and start daily OTC.  Patient to start 5k international unit daily       Relevant Medications   Cholecalciferol (VITAMIN D3) 125 MCG (5000 UT) CAPS    Vitals Temp: 97.6 F (36.4 C) BP: 114/73 Pulse Rate: 66 SpO2: 99 %   Anthropometric Measurements Height: 5\' 4"  (1.626 m) Weight: (!) 313 lb (142 kg) BMI (Calculated): 53.7 Weight at Last Visit: 313 lb Weight Lost Since Last Visit: 0 Weight Gained Since Last Visit: 0 Starting Weight: 333lb Total Weight Loss (lbs): 20 lb (9.072 kg)   Body Composition  Body Fat  %: 67.8 % Fat Mass (lbs): 212.2 lbs Muscle Mass (lbs): 95.6 lbs Visceral Fat Rating : 28   Other Clinical Data Today's Visit #: 24 Starting Date: 04/07/20     ASSESSMENT AND PLAN:  Diet: Ivylynn is currently in the action stage of change. As such, her goal is to continue with weight loss efforts. She has agreed to Category 2 Plan.  Exercise: [name] has been instructed that some exercise is better than none for weight loss and overall health benefits.   Behavior Modification:  We discussed the following Behavioral Modification Strategies today: increasing lean protein intake, increasing vegetables, meal planning and cooking strategies, better snacking choices, and planning for success.   No follow-ups on file.[name] She was informed of the importance of frequent follow up visits to maximize her success with intensive lifestyle modifications for her multiple health conditions.  Attestation Statements:   Reviewed by clinician on day of visit: allergies, medications, problem list, medical history, surgical history, family history, social history, and previous encounter notes.   Reuben Likes, MD

## 2023-01-03 NOTE — Assessment & Plan Note (Signed)
Recent A1c of 5.6.  This is slightly increased from previously.  Her insulin level was significantly improved from prior lab draw.  She is to stay on same dose Ozempic and does not need a refill at this time.

## 2023-02-08 ENCOUNTER — Ambulatory Visit (INDEPENDENT_AMBULATORY_CARE_PROVIDER_SITE_OTHER): Payer: 59 | Admitting: Family Medicine

## 2023-02-08 ENCOUNTER — Encounter (INDEPENDENT_AMBULATORY_CARE_PROVIDER_SITE_OTHER): Payer: Self-pay | Admitting: Family Medicine

## 2023-02-08 VITALS — BP 107/70 | HR 61 | Temp 98.2°F | Ht 64.0 in | Wt 308.0 lb

## 2023-02-08 DIAGNOSIS — E669 Obesity, unspecified: Secondary | ICD-10-CM

## 2023-02-08 DIAGNOSIS — Z6841 Body Mass Index (BMI) 40.0 and over, adult: Secondary | ICD-10-CM

## 2023-02-08 DIAGNOSIS — E1159 Type 2 diabetes mellitus with other circulatory complications: Secondary | ICD-10-CM

## 2023-02-08 DIAGNOSIS — E1165 Type 2 diabetes mellitus with hyperglycemia: Secondary | ICD-10-CM | POA: Diagnosis not present

## 2023-02-08 DIAGNOSIS — I152 Hypertension secondary to endocrine disorders: Secondary | ICD-10-CM

## 2023-02-08 DIAGNOSIS — Z7985 Long-term (current) use of injectable non-insulin antidiabetic drugs: Secondary | ICD-10-CM

## 2023-02-08 NOTE — Assessment & Plan Note (Signed)
 BP today very well controlled.  She is on torsemide, and irbesartan.  She does not need refills.  If BP stays at current level can consider decreasing ARB.

## 2023-02-08 NOTE — Progress Notes (Signed)
   SUBJECTIVE:  Chief Complaint: Obesity  Interim History: Patient had a difficult holiday season.  Christmas Eve she was in best all day.  She was up during the day on Christmas because she had plans.  She was ready for the holidays to be over. She has gotten back on track and is back at work.  She focused on eating more since last appointment. She has been eating leftovers for lunch regardless of whether or not she has gone grocery shopping.  She is just making sure she eats something. She is planning to get shoulder surgery (replacement) in March.  She is planning to take a trip to see her uncle.   Andersen is here to discuss her progress with her obesity treatment plan. She is on the Category 2 Plan and states she is following her eating plan approximately 85-90 % of the time. She states she is not exercising.   OBJECTIVE: Visit Diagnoses: Problem List Items Addressed This Visit       Cardiovascular and Mediastinum   Hypertension associated with diabetes (HCC)   BP today very well controlled.  She is on torsemide , and irbesartan .  She does not need refills.  If BP stays at current level can consider decreasing ARB.        Endocrine   Type 2 diabetes mellitus with hyperglycemia, without long-term current use of insulin  [facility]) - Primary   Patient doing well on Ozempic .  She is working on making sure she doesn't skip meals. She mentions that she will eat something and while it may not be on plan she isn't skipping meals. No side effects of medication. Last A1c of 5.6.  She does not need refill of her medication today.        Vitals Temp: 98.2 F (36.8 C) BP: 107/70 Pulse Rate: 61 SpO2: 99 %   Anthropometric Measurements Height: 5' 4 (1.626 m) Weight: (!) 308 lb (139.7 kg) BMI (Calculated): 52.84 Weight at Last Visit: 313 lb Weight Lost Since Last Visit: 5 Weight Gained Since Last Visit: 0 Starting Weight: 333 lb Total Weight Loss (lbs): 25 lb (11.3 kg)   Body  Composition  Body Fat %: 65.4 % Fat Mass (lbs): 201.8 lbs Muscle Mass (lbs): 101.4 lbs Visceral Fat Rating : 26   Other Clinical Data Fasting: no Labs: no Today's Visit #: 39 Starting Date: 04/07/20     ASSESSMENT AND PLAN:  Diet: Keina is currently in the action stage of change. As such, her goal is to continue with weight loss efforts. She has agreed to Category 2 Plan.  Exercise: Joslynne has been instructed that some exercise is better than none for weight loss and overall health benefits.   Behavior Modification:  We discussed the following Behavioral Modification Strategies today: increasing lean protein intake, increasing vegetables, no skipping meals, and keeping healthy foods in the home.   No follow-ups on file.SABRA She was informed of the importance of frequent follow up visits to maximize her success with intensive lifestyle modifications for her multiple health conditions.  Attestation Statements:   Reviewed by clinician on day of visit: allergies, medications, problem list, medical history, surgical history, family history, social history, and previous encounter notes.   Adelita Cho, MD

## 2023-02-08 NOTE — Assessment & Plan Note (Signed)
 Patient doing well on Ozempic .  She is working on making sure she doesn't skip meals. She mentions that she will eat something and while it may not be on plan she isn't skipping meals. No side effects of medication. Last A1c of 5.6.  She does not need refill of her medication today.

## 2023-03-02 ENCOUNTER — Other Ambulatory Visit (INDEPENDENT_AMBULATORY_CARE_PROVIDER_SITE_OTHER): Payer: Self-pay | Admitting: Family Medicine

## 2023-03-02 DIAGNOSIS — F419 Anxiety disorder, unspecified: Secondary | ICD-10-CM

## 2023-03-08 ENCOUNTER — Ambulatory Visit (INDEPENDENT_AMBULATORY_CARE_PROVIDER_SITE_OTHER): Payer: 59 | Admitting: Family Medicine

## 2023-03-08 ENCOUNTER — Encounter (INDEPENDENT_AMBULATORY_CARE_PROVIDER_SITE_OTHER): Payer: Self-pay | Admitting: Family Medicine

## 2023-03-08 VITALS — BP 109/73 | HR 62 | Temp 97.6°F | Ht 64.0 in | Wt 315.0 lb

## 2023-03-08 DIAGNOSIS — I890 Lymphedema, not elsewhere classified: Secondary | ICD-10-CM

## 2023-03-08 DIAGNOSIS — E66813 Obesity, class 3: Secondary | ICD-10-CM | POA: Diagnosis not present

## 2023-03-08 DIAGNOSIS — F419 Anxiety disorder, unspecified: Secondary | ICD-10-CM

## 2023-03-08 DIAGNOSIS — F32A Depression, unspecified: Secondary | ICD-10-CM

## 2023-03-08 DIAGNOSIS — Z6841 Body Mass Index (BMI) 40.0 and over, adult: Secondary | ICD-10-CM

## 2023-03-08 MED ORDER — SERTRALINE HCL 50 MG PO TABS: 75.0000 mg | ORAL_TABLET | Freq: Every day | ORAL | 0 refills | Status: DC

## 2023-03-08 NOTE — Assessment & Plan Note (Signed)
 Bilateral lower extremity swelling.  She is noticing sometimes a fluctuation of 5lbs in a day due to swelling.  She is on a diuretic that isn't providing any relief.  She had a recent Echo that did not show a change in ejection fraction.  We discussed referral to the Lymphedema clinic to help provide more aggressive treatment that is individualized.  She does not have any kidney or liver issues that could be a contributing factor

## 2023-03-08 NOTE — Progress Notes (Signed)
 SUBJECTIVE:  Chief Complaint: Obesity  Interim History: Patient voices concern today about her leg swelling.  She is interested in pursuing treatment for her lymphedema.  She is wondering about other fruit options besides apple, peach, pears and berries.  She wants to incorporate grapefruits.  She is still experiencing constipation with Mounjaro .  For this month she doesn't have much planned but next month she has a reverse shoulder replacement planned. Biggest challenge over the last month has been eating total quantity.  She is eating 3 meals a day which is a big improvement from what was being done previously.   Nansi is here to discuss her progress with her obesity treatment plan. She is on the Category 2 Plan and states she is following her eating plan approximately 85-90 % of the time. She states she is walking more at work.   OBJECTIVE: Visit Diagnoses: Problem List Items Addressed This Visit       Other   Class 3 severe obesity with serious comorbidity and body mass index (BMI) of 50.0 to 59.9 in adult [facility])   Starting weight: 333 on 04/07/20 BMR: 1524 on 04/22/20 Previous obesity management: Patient has previously been on semaglutide  and is currently on tirzepatide  for her diabetes.  Unfortunately has not had significant loss in maintenance of that loss on either medications Body Fat %: 67.3% Starting Meal Plan: Category 2        Anxiety and depression   Patient increased her medication from 50 to 75 daily due to feeling the 50 mg not feeling like its cutting it.  She mentions she needs her prescription changed.  New Rx sent in      Relevant Medications   sertraline  (ZOLOFT ) 50 MG tablet   Lymphedema - Primary   Bilateral lower extremity swelling.  She is noticing sometimes a fluctuation of 5lbs in a day due to swelling.  She is on a diuretic that isn't providing any relief.  She had a recent Echo that did not show a change in ejection fraction.  We discussed referral to  the Lymphedema clinic to help provide more aggressive treatment that is individualized.  She does not have any kidney or liver issues that could be a contributing factor       No data recorded  No data recorded  No data recorded  No data recorded     03/08/2023    7:00 AM 02/08/2023    7:00 AM 01/03/2023   12:00 PM  Vitals with BMI  Height 5' 4 5' 4 5' 4  Weight 315 lbs 308 lbs 313 lbs  BMI 54.04 52.84 53.7  Systolic 109 107 885  Diastolic 73 70 73  Pulse 62 61 66     ASSESSMENT AND PLAN:  Diet: Caleesi is currently in the action stage of change. As such, her goal is to continue with weight loss efforts. She has agreed to Category 2 Plan.  Exercise: Lisanne has been instructed that some exercise is better than none for weight loss and overall health benefits.   Behavior Modification:  We discussed the following Behavioral Modification Strategies today: increasing lean protein intake, increasing vegetables, no skipping meals, meal planning and cooking strategies, and keeping healthy foods in the home.   Return in about 4 weeks (around 04/05/2023) for fasting labs and IC.SABRA She was informed of the importance of frequent follow up visits to maximize her success with intensive lifestyle modifications for her multiple health conditions.  Attestation Statements:   Reviewed  by clinician on day of visit: allergies, medications, problem list, medical history, surgical history, family history, social history, and previous encounter notes.     Adelita Cho, MD

## 2023-03-08 NOTE — Assessment & Plan Note (Addendum)
 Patient increased her medication from 50 to 75 daily due to feeling the 50 mg not feeling like its cutting it.  She mentions she needs her prescription changed.  New Rx sent in

## 2023-03-14 NOTE — Assessment & Plan Note (Signed)
Starting weight: 333 on 04/07/20 BMR: 1524 on 04/22/20 Previous obesity management: Patient has previously been on semaglutide and is currently on tirzepatide for her diabetes.  Unfortunately has not had significant loss in maintenance of that loss on either medications Body Fat %: 67.3% Starting Meal Plan: Category 2

## 2023-03-16 DIAGNOSIS — R251 Tremor, unspecified: Secondary | ICD-10-CM

## 2023-03-16 DIAGNOSIS — R413 Other amnesia: Secondary | ICD-10-CM

## 2023-03-16 HISTORY — DX: Tremor, unspecified: R25.1

## 2023-03-16 HISTORY — DX: Other amnesia: R41.3

## 2023-04-01 HISTORY — PX: OTHER SURGICAL HISTORY: SHX169

## 2023-04-04 ENCOUNTER — Encounter (INDEPENDENT_AMBULATORY_CARE_PROVIDER_SITE_OTHER): Payer: Self-pay | Admitting: Family Medicine

## 2023-04-05 NOTE — Telephone Encounter (Signed)
 Please review

## 2023-04-12 ENCOUNTER — Encounter (INDEPENDENT_AMBULATORY_CARE_PROVIDER_SITE_OTHER): Payer: Self-pay | Admitting: Family Medicine

## 2023-04-12 ENCOUNTER — Ambulatory Visit (INDEPENDENT_AMBULATORY_CARE_PROVIDER_SITE_OTHER): Payer: 59 | Admitting: Family Medicine

## 2023-04-12 VITALS — BP 111/75 | HR 66 | Temp 98.3°F | Ht 64.0 in | Wt 314.0 lb

## 2023-04-12 DIAGNOSIS — Z6841 Body Mass Index (BMI) 40.0 and over, adult: Secondary | ICD-10-CM

## 2023-04-12 DIAGNOSIS — Z7985 Long-term (current) use of injectable non-insulin antidiabetic drugs: Secondary | ICD-10-CM

## 2023-04-12 DIAGNOSIS — E1159 Type 2 diabetes mellitus with other circulatory complications: Secondary | ICD-10-CM

## 2023-04-12 DIAGNOSIS — E669 Obesity, unspecified: Secondary | ICD-10-CM | POA: Diagnosis not present

## 2023-04-12 DIAGNOSIS — E66813 Obesity, class 3: Secondary | ICD-10-CM

## 2023-04-12 DIAGNOSIS — I152 Hypertension secondary to endocrine disorders: Secondary | ICD-10-CM | POA: Diagnosis not present

## 2023-04-12 DIAGNOSIS — E1165 Type 2 diabetes mellitus with hyperglycemia: Secondary | ICD-10-CM | POA: Diagnosis not present

## 2023-04-12 MED ORDER — SEMAGLUTIDE (1 MG/DOSE) 4 MG/3ML ~~LOC~~ SOPN: 1.0000 mg | PEN_INJECTOR | SUBCUTANEOUS | 0 refills | Status: DC

## 2023-04-12 NOTE — Assessment & Plan Note (Signed)
 Blood pressure well controlled today- no chest pain, chest pressure or headache.  Does not need a refill of irbesartan.  Continue to monitor BP at next appointments.

## 2023-04-12 NOTE — Assessment & Plan Note (Signed)
 See measurement data.  She does struggle with significant lymphedema that is now being treated by lymphedema clinic.  Patient to transition to less inflammatory diet.

## 2023-04-12 NOTE — Assessment & Plan Note (Addendum)
 Patient doing well on current dose of Ozempic.  She mentions the first day she tends to graze and then second day is better and third day is controlled but she is able to eat on plan.  Refill needed today- will continue current dose and medication.

## 2023-04-12 NOTE — Progress Notes (Signed)
   SUBJECTIVE:  Chief Complaint: Obesity  Interim History: Went to lymphedema clinic- she feels better with a plan for her lymphedema.  She has started a vibration plate, getting fitted for compression machine and is interested in a anti- inflammatory diet. She has been more deliberate with increasing her food intake and eating 3 meals a day.  She does mention some indulgent eating but reports doing this in moderation.  Shoulder surgery is next Tuesday.  Angel Watkins is here to discuss her progress with her obesity treatment plan. She is on the Category 2 Plan and states she is following her eating plan approximately 65% of the time. She states she is exercising 30 minutes 7 times per week.   OBJECTIVE: Visit Diagnoses: Problem List Items Addressed This Visit       Cardiovascular and Mediastinum   Hypertension associated with diabetes (HCC) - Primary   Blood pressure well controlled today- no chest pain, chest pressure or headache.  Does not need a refill of irbesartan.  Continue to monitor BP at next appointments.      Relevant Medications   Semaglutide, 1 MG/DOSE, 4 MG/3ML SOPN     Endocrine   Type 2 diabetes mellitus with hyperglycemia, without long-term current use of insulin (HCC)   Patient doing well on current dose of Ozempic.  She mentions the first day she tends to graze and then second day is better and third day is controlled but she is able to eat on plan.  Refill needed today- will continue current dose and medication.      Relevant Medications   Semaglutide, 1 MG/DOSE, 4 MG/3ML SOPN    Vitals Temp: 98.3 F (36.8 C) BP: 111/75 Pulse Rate: 66 SpO2: 99 %   Anthropometric Measurements Height: 5\' 4"  (1.626 m) Weight: (!) 314 lb (142.4 kg) BMI (Calculated): 53.87 Weight at Last Visit: 211 lb Weight Lost Since Last Visit: 1 lb Weight Gained Since Last Visit: 0 Starting Weight: 333 lb Total Weight Loss (lbs): 19 lb (8.618 kg)   Body Composition  Body Fat %: 65.2  % Fat Mass (lbs): 205 lbs Muscle Mass (lbs): 103.8 lbs Visceral Fat Rating : 27   Other Clinical Data Fasting: no Labs: yes Today's Visit #: 41 Starting Date: 04/07/20     ASSESSMENT AND PLAN:  Diet: Angel Watkins is currently in the action stage of change. As such, her goal is to continue with weight loss efforts and has agreed to the Category 2 Plan.  We discussed at length how to decrease inflammation and limiting processed foods over the next few weeks.  This means creating meals, snacks etc from scratch.  This does not mean she needs to eradicate all prepackaged food but does mean limiting them.   Exercise:  Patient to continue using vibration plate, continue with physical therapy for her lymphedema and upcoming shoulder surgery.  Behavior Modification:  We discussed the following Behavioral Modification Strategies today: increasing lean protein intake, increasing vegetables, no skipping meals, meal planning and cooking strategies, and planning for success.   No follow-ups on file.[name] She was informed of the importance of frequent follow up visits to maximize her success with intensive lifestyle modifications for her multiple health conditions.  Attestation Statements:   Reviewed by clinician on day of visit: allergies, medications, problem list, medical history, surgical history, family history, social history, and previous encounter notes.     Reuben Likes, MD

## 2023-05-24 ENCOUNTER — Ambulatory Visit (INDEPENDENT_AMBULATORY_CARE_PROVIDER_SITE_OTHER): Admitting: Family Medicine

## 2023-05-24 ENCOUNTER — Encounter (INDEPENDENT_AMBULATORY_CARE_PROVIDER_SITE_OTHER): Payer: Self-pay | Admitting: Family Medicine

## 2023-05-24 VITALS — BP 103/71 | HR 70 | Temp 98.0°F | Ht 64.0 in | Wt 321.0 lb

## 2023-05-24 DIAGNOSIS — I152 Hypertension secondary to endocrine disorders: Secondary | ICD-10-CM

## 2023-05-24 DIAGNOSIS — E1159 Type 2 diabetes mellitus with other circulatory complications: Secondary | ICD-10-CM

## 2023-05-24 DIAGNOSIS — R0602 Shortness of breath: Secondary | ICD-10-CM | POA: Diagnosis not present

## 2023-05-24 DIAGNOSIS — E669 Obesity, unspecified: Secondary | ICD-10-CM

## 2023-05-24 DIAGNOSIS — Z6841 Body Mass Index (BMI) 40.0 and over, adult: Secondary | ICD-10-CM

## 2023-05-24 DIAGNOSIS — E1165 Type 2 diabetes mellitus with hyperglycemia: Secondary | ICD-10-CM | POA: Diagnosis not present

## 2023-05-24 DIAGNOSIS — E785 Hyperlipidemia, unspecified: Secondary | ICD-10-CM

## 2023-05-24 DIAGNOSIS — E1169 Type 2 diabetes mellitus with other specified complication: Secondary | ICD-10-CM | POA: Diagnosis not present

## 2023-05-24 DIAGNOSIS — Z7985 Long-term (current) use of injectable non-insulin antidiabetic drugs: Secondary | ICD-10-CM

## 2023-05-24 NOTE — Assessment & Plan Note (Signed)
 IC on 04/22/20 of 1524.  Symptoms have been unchanged since that time. Indirect calorimetry today of 1642. Will continue on Category 2 with encouragement to go up to 8oz of meat at dinner or using protein substitutions.  Patient to focus more on increasing total intake.

## 2023-05-24 NOTE — Progress Notes (Signed)
 SUBJECTIVE:  Chief Complaint: Obesity  Interim History: Patient has not done much since her last appointment due to recent shoulder surgery.  She felt she became a "junk food junkie".   She was homebound due to the surgery and ate whatever was in the house.  She is still working on her lymphedema issue.  She is doing well in PT for her shoulder.    Angel Watkins is here to discuss her progress with her obesity treatment plan. She is on the Category 2 Plan and states she is following her eating plan approximately 40 % of the time. She states she is not exercising, but is doing PT.   OBJECTIVE: Visit Diagnoses: Problem List Items Addressed This Visit       Cardiovascular and Mediastinum   Hypertension associated with diabetes (HCC)   Blood pressure well controlled today.  No chest pain, chest pressure or headache.  Will order CMP today to get electrolytes and kidney function.  Plan to discuss labs at next appointment.  No refills of medication necessary.      Relevant Orders   Comprehensive metabolic panel with GFR (Completed)     Endocrine   Type 2 diabetes mellitus with hyperglycemia, without long-term current use of insulin  [facility])   Patient still working on eating all food on meal plan and limiting indulgent food choices.  No sie effects of ozempic  per patient.  No refills of medication needed.  A1c and fasting insulin  level ordered today.      Relevant Orders   Comprehensive metabolic panel with GFR (Completed)   Hemoglobin A1c (Completed)   Insulin , random (Completed)   Hyperlipidemia associated with type 2 diabetes mellitus (HCC)   On statin with no reported myalgias.  Fating for a repeat lipid panel today.  Will discuss results at next appointment.      Relevant Orders   Lipid Panel With LDL/HDL Ratio (Completed)     Other   SOBOE (shortness of breath on exertion) - Primary   IC on 04/22/20 of 1524.  Symptoms have been unchanged since that time. Indirect calorimetry today of  1642. Will continue on Category 2 with encouragement to go up to 8oz of meat at dinner or using protein substitutions.  Patient to focus more on increasing total intake.      Other Visit Diagnoses       Obesity with starting BMI of 57.1         BMI 50.0-59.9, adult (HCC)           No data recorded  Vitals:   05/24/23 0700  BP: 103/71  Pulse: 70  Temp: 98 F (36.7 C)  SpO2: 98%        ASSESSMENT AND PLAN:  Diet: Angel Watkins is currently in the action stage of change. As such, her goal is to continue with weight loss efforts and has agreed to the Category 2 Plan.  Plan to start limiting her intake of indulgent food and be more mindful of ensuring nutritional food intake.   Exercise:  All adults should avoid inactivity. Some activity is better than none, and adults who participate in any amount of physical activity, gain some health benefits.  Behavior Modification:  We discussed the following Behavioral Modification Strategies today: increasing lean protein intake, decreasing simple carbohydrates, meal planning and cooking strategies, keeping healthy foods in the home, and planning for success.   Return in about 4 weeks (around 06/21/2023).[name] She was informed of the importance of frequent follow up visits  to maximize her success with intensive lifestyle modifications for her multiple health conditions.  Attestation Statements:   Reviewed by clinician on day of visit: allergies, medications, problem list, medical history, surgical history, family history, social history, and previous encounter notes.     Donaciano Frizzle, MD

## 2023-05-26 LAB — COMPREHENSIVE METABOLIC PANEL WITH GFR
ALT: 26 IU/L (ref 0–32)
AST: 21 IU/L (ref 0–40)
Albumin: 3.7 g/dL — ABNORMAL LOW (ref 3.8–4.9)
Alkaline Phosphatase: 113 IU/L (ref 44–121)
BUN/Creatinine Ratio: 24 (ref 12–28)
BUN: 22 mg/dL (ref 8–27)
Bilirubin Total: 0.4 mg/dL (ref 0.0–1.2)
CO2: 28 mmol/L (ref 20–29)
Calcium: 8.9 mg/dL (ref 8.7–10.3)
Chloride: 103 mmol/L (ref 96–106)
Creatinine, Ser: 0.9 mg/dL (ref 0.57–1.00)
Globulin, Total: 1.8 g/dL (ref 1.5–4.5)
Glucose: 90 mg/dL (ref 70–99)
Potassium: 4.1 mmol/L (ref 3.5–5.2)
Sodium: 143 mmol/L (ref 134–144)
Total Protein: 5.5 g/dL — ABNORMAL LOW (ref 6.0–8.5)
eGFR: 73 mL/min/{1.73_m2} (ref >59)

## 2023-05-26 LAB — LIPID PANEL WITH LDL/HDL RATIO
Cholesterol, Total: 131 mg/dL (ref 100–199)
HDL: 68 mg/dL (ref >39)
LDL Chol Calc (NIH): 52 mg/dL (ref 0–99)
LDL/HDL Ratio: 0.8 ratio (ref 0.0–3.2)
Triglycerides: 50 mg/dL (ref 0–149)
VLDL Cholesterol Cal: 11 mg/dL (ref 5–40)

## 2023-05-26 LAB — HEMOGLOBIN A1C
Est. average glucose Bld gHb Est-mCnc: 103 mg/dL
Hgb A1c MFr Bld: 5.2 % (ref 4.8–5.6)

## 2023-05-26 LAB — INSULIN, RANDOM: INSULIN: 9.9 u[IU]/mL (ref 2.6–24.9)

## 2023-05-29 DIAGNOSIS — E1169 Type 2 diabetes mellitus with other specified complication: Secondary | ICD-10-CM | POA: Insufficient documentation

## 2023-05-29 NOTE — Assessment & Plan Note (Signed)
 Patient still working on eating all food on meal plan and limiting indulgent food choices.  No sie effects of ozempic  per patient.  No refills of medication needed.  A1c and fasting insulin  level ordered today.

## 2023-05-29 NOTE — Assessment & Plan Note (Signed)
 Anthropometric Measurements Height: 5\' 4"  (1.626 m) Weight: (!) 321 lb (145.6 kg) BMI (Calculated): 55.07 Weight at Last Visit: 314 lb Weight Lost Since Last Visit: 0 Weight Gained Since Last Visit: 7 Starting Weight: 333 lb Total Weight Loss (lbs): 12 lb (5.443 kg) Body Composition  Body Fat %: 63.8 % Fat Mass (lbs): 205.2 lbs Muscle Mass (lbs): 110.6 lbs Visceral Fat Rating : 27 Other Clinical Data RMR: 1642 Fasting: yes Labs: yes Today's Visit #: 42 Starting Date: 04/07/20 Comments: Cat 2

## 2023-05-29 NOTE — Assessment & Plan Note (Signed)
 Blood pressure well controlled today.  No chest pain, chest pressure or headache.  Will order CMP today to get electrolytes and kidney function.  Plan to discuss labs at next appointment.  No refills of medication necessary.

## 2023-05-29 NOTE — Assessment & Plan Note (Signed)
 On statin with no reported myalgias.  Fating for a repeat lipid panel today.  Will discuss results at next appointment.

## 2023-06-11 ENCOUNTER — Other Ambulatory Visit (INDEPENDENT_AMBULATORY_CARE_PROVIDER_SITE_OTHER): Payer: Self-pay | Admitting: Family Medicine

## 2023-06-11 DIAGNOSIS — F419 Anxiety disorder, unspecified: Secondary | ICD-10-CM

## 2023-06-29 ENCOUNTER — Other Ambulatory Visit (INDEPENDENT_AMBULATORY_CARE_PROVIDER_SITE_OTHER): Payer: Self-pay

## 2023-06-29 ENCOUNTER — Encounter (INDEPENDENT_AMBULATORY_CARE_PROVIDER_SITE_OTHER): Payer: Self-pay | Admitting: Family Medicine

## 2023-06-29 DIAGNOSIS — F32A Depression, unspecified: Secondary | ICD-10-CM

## 2023-06-29 DIAGNOSIS — F419 Anxiety disorder, unspecified: Secondary | ICD-10-CM

## 2023-06-29 MED ORDER — SERTRALINE HCL 50 MG PO TABS: 75.0000 mg | ORAL_TABLET | Freq: Every day | ORAL | 0 refills | Status: DC

## 2023-07-05 ENCOUNTER — Encounter (INDEPENDENT_AMBULATORY_CARE_PROVIDER_SITE_OTHER): Payer: Self-pay | Admitting: Family Medicine

## 2023-07-05 ENCOUNTER — Ambulatory Visit (INDEPENDENT_AMBULATORY_CARE_PROVIDER_SITE_OTHER): Admitting: Family Medicine

## 2023-07-05 VITALS — BP 118/71 | HR 74 | Temp 98.1°F | Ht 64.0 in | Wt 325.0 lb

## 2023-07-05 DIAGNOSIS — F419 Anxiety disorder, unspecified: Secondary | ICD-10-CM | POA: Diagnosis not present

## 2023-07-05 DIAGNOSIS — F32A Depression, unspecified: Secondary | ICD-10-CM | POA: Diagnosis not present

## 2023-07-05 DIAGNOSIS — Z6841 Body Mass Index (BMI) 40.0 and over, adult: Secondary | ICD-10-CM

## 2023-07-05 DIAGNOSIS — E1165 Type 2 diabetes mellitus with hyperglycemia: Secondary | ICD-10-CM | POA: Diagnosis not present

## 2023-07-05 DIAGNOSIS — Z7985 Long-term (current) use of injectable non-insulin antidiabetic drugs: Secondary | ICD-10-CM

## 2023-07-05 MED ORDER — SERTRALINE HCL 50 MG PO TABS
75.0000 mg | ORAL_TABLET | Freq: Every day | ORAL | 0 refills | Status: DC
Start: 2023-07-05 — End: 2023-09-19

## 2023-07-05 NOTE — Progress Notes (Signed)
 SUBJECTIVE:  Chief Complaint: Obesity  Interim History: Patient signed up for a wellness program at work that offers PT type exercises that target specific areas of the body that patient's experience the most issues with.  She mentions there have been quite a few days she may not have eaten.  She has her first evaluation for lymphedema PT next week.  She has noticed an increase in size of her legs since starting her program.  She isn't sure what happened but she got off plan with her Ozempic .  She and her husband have restocked the refridgerator and the cupboards, they bought some things to cook.  Doesn't have much planned for June aside from an [employer] heritage ball, 89th birthday party.  She is going to pay off the mortgage for her house today.   Angel Watkins is here to discuss her progress with her obesity treatment plan. She is on the Category 2 Plan and states she is following her eating plan approximately 50  % of the time. She states she is exercising 30 minutes 3 times per week.   OBJECTIVE: Visit Diagnoses: Problem List Items Addressed This Visit       Endocrine   Type 2 diabetes mellitus with hyperglycemia, without long-term current use of insulin  (HCC)   Patient is on Ozempic  weekly with no GI side effects except early satiety.  She does not need a refill today because she got off schedule with her medication around tim with her surgery. Patient has since gotten back on a regular weekly schedule of medication.        Other   Anxiety and depression - Primary   Patient is on zoloft  daily.  No SI or HI mentioned.  Symptoms well managed on current medication dosage.  No change in med but refill needed.      Relevant Medications   sertraline  (ZOLOFT ) 50 MG tablet   Morbid obesity (HCC)   Anthropometric Measurements Height: 5' 4 (1.626 m) Weight: (!) 325 lb (147.4 kg) BMI (Calculated): 55.76 Weight at Last Visit: 321 lb Weight Lost Since Last Visit: 0 Weight Gained Since Last  Visit: 4 Starting Weight: 333 lb Total Weight Loss (lbs): 8 lb (3.629 kg) Body Composition  Body Fat %: 66.9 % Fat Mass (lbs): 217.6 lbs Muscle Mass (lbs): 102.4 lbs Visceral Fat Rating : 28 Other Clinical Data Fasting: no Labs: no Today's Visit #: 43 Starting Date: 04/07/20 Comments: Cat 2       Other Visit Diagnoses       BMI 50.0-59.9, adult (HCC)           No data recorded       07/05/2023    8:00 AM 05/24/2023    7:00 AM 04/12/2023    7:00 AM  Vitals with BMI  Height 5' 4 5' 4 5' 4  Weight 325 lbs 321 lbs 314 lbs  BMI 55.76 55.07 53.87  Systolic 118 103 161  Diastolic 71 71 75  Pulse 74 70 66      ASSESSMENT AND PLAN:  Diet: [name] is currently in the action stage of change. As such, her goal is to continue with weight loss efforts and has agreed to the Category 2 Plan.   Exercise:  All adults should avoid inactivity. Some activity is better than none, and adults who participate in any amount of physical activity, gain some health benefits.  Behavior Modification:  We discussed the following Behavioral Modification Strategies today: increasing lean protein intake, decreasing simple  carbohydrates, increasing vegetables, meal planning and cooking strategies, keeping healthy foods in the home, and planning for success.   Return in about 6 weeks (around 08/16/2023).   She was informed of the importance of frequent follow up visits to maximize her success with intensive lifestyle modifications for her multiple health conditions.  Attestation Statements:   Reviewed by clinician on day of visit: allergies, medications, problem list, medical history, surgical history, family history, social history, and previous encounter notes.     Donaciano Frizzle, MD

## 2023-07-12 ENCOUNTER — Ambulatory Visit: Attending: Physician Assistant | Admitting: Occupational Therapy

## 2023-07-12 ENCOUNTER — Encounter: Payer: Self-pay | Admitting: Occupational Therapy

## 2023-07-12 DIAGNOSIS — I890 Lymphedema, not elsewhere classified: Secondary | ICD-10-CM | POA: Insufficient documentation

## 2023-07-12 NOTE — Therapy (Addendum)
 OUTPATIENT OCCUPATIONAL THERAPY  EVALUATION  BILATERAL LOWER EXTREMITY LIPO-LYMPHEDEMA  Patient Name: Angel Watkins MRN: [id number] DOB:08-29-1960, 63 y.o., female Today's Date: 07/14/2023  END OF SESSION:   OT End of Session - 07/14/23 0918     Visit Number 1    Number of Visits 36    Date for OT Re-Evaluation 10/10/23    OT Start Time 0800    OT Stop Time 0900    OT Time Calculation (min) 60 min    Activity Tolerance Patient tolerated treatment well;No increased pain    Behavior During Therapy WFL for tasks assessed/performed            Past Medical History:  Diagnosis Date   Anxiety    Arthritis    Back pain    Bilateral swelling of feet    Bulging lumbar disc    and cervical   Diabetes mellitus without complication (HCC)    Fibromyalgia    Hypertension    Joint pain    Palpitations    Sleep apnea    Spinal stenosis    Wears glasses    Past Surgical History:  Procedure Laterality Date   ABDOMINAL HYSTERECTOMY     ANAL FISSURE REPAIR     ANTERIOR CERVICAL DECOMP/DISCECTOMY FUSION N/A 12/03/2018   Procedure: Cervical five-six  Anterior cervical decompression/discectomy/fusion;  Surgeon: Angel Champion, MD;  Location: [facility] OR;  Service: Neurosurgery;  Laterality: N/A;   BUNIONECTOMY Left 2009   GASTRIC BYPASS     LUMBAR LAMINECTOMY     RADIOLOGY WITH ANESTHESIA N/A 12/14/2017   Procedure: MRI WITH ANESTHESIA, LUMBAR SPINE WITHOUT CONTRAST, CERVICAL SPINE WITHOUT CONTRAST;  Surgeon: Radiologist, Medication, MD;  Location: [facility] OR;  Service: Radiology;  Laterality: N/A;   RADIOLOGY WITH ANESTHESIA N/A 10/08/2019   Procedure: MRI WITH ANESTHESIA    LUMBAR WITHOUT;  Surgeon: Radiologist, Medication, MD;  Location: [facility] OR;  Service: Radiology;  Laterality: N/A;   Patient Active Problem List   Diagnosis Date Noted   Hyperlipidemia associated with type 2 diabetes mellitus (HCC) 05/29/2023   SOBOE (shortness of breath on exertion) 05/24/2023   Lymphedema  03/08/2023   Anxiety and depression 12/06/2022   Eating disorder 12/02/2020   Vitamin D  deficiency 12/02/2020   Type 2 diabetes mellitus with hyperglycemia, without long-term current use of insulin  (HCC) 07/20/2020   Hypertension associated with diabetes (HCC) 07/20/2020   Class 3 severe obesity with serious comorbidity and body mass index (BMI) of 50.0 to 59.9 in adult 07/20/2020   Cervical radiculopathy 12/03/2018    PCP: Angel Doctor, PA ([facility], [place], [place])  REFERRING PROVIDER: Cecilia Coe, MD  REFERRING DIAG: I89.0  THERAPY DIAG:  Lymphedema, not elsewhere classified  Rationale for Evaluation and Treatment: Rehabilitation  ONSET DATE: 2002 s/p gastric bypass.  SUBJECTIVE:  SUBJECTIVE STATEMENT: Angel Watkins is referred to Occupational Therapy by Angel Coe, MD at [facility] for evaluation and treatment of BLE/BLQ Lipo-lymphedema.Pt reports she first noticed leg swelling after gastric bypass surgery. She states, I lost 100 pounds, but my legs just got bigger instead of going down. Pt reports lower extremity and lower quadrant ( buttocks, abdomen, hips) worsened continues to worsenover time. She is unable to fit off the shelf compression stockings. Diuretics, prescribed in the past, do not reduce swelling. Pt describes lower extremity/ lower quadrant pain and discomfort as heaviness. She rates  this painful sensation as 6-7/10. Pt reports her mother had some symptoms of lipedema, but she is unaware of other relatives that may have had lymphedema or lipedema. Pt denies hx of cellulitis and blood clots. Pt reports her mother had the same body type with a small waist and large, fatty hips and legs.    PERTINENT HISTORY:   Anxiety    Arthritis    Back pain    Bilateral swelling of  feet    Bulging lumbar disc    and cervical   Diabetes mellitus without complication (HCC)    Fibromyalgia    Hypertension    Joint pain    Palpitations    Sleep apnea    Spinal stenosis   Gastric bypass 2002   Class 3 severe obesity with serious comorbidity and body mass index (BMI) of 50.0 to 59.9 in adult (BMI measured this date 55.6)   Cervical radiculopathy    PAIN:  Are you having pain? No  PRECAUTIONS: Other: LYMPHEDEMA PRECAUTIONS  WEIGHT BEARING RESTRICTIONS: No  FALLS:  Has patient fallen in last 6 months? Yes. Number of falls 1; Just got a new scooter. It caught my shirt and pulled me down. But I wasn't hurt.  LIVING ENVIRONMENT: Lives with: lives with their spouse Lives in: House/apartment Stairs: Yes; External: 3 steps; can reach both Has following equipment at home: Single point cane, shower chair, and hand held shower  OCCUPATION: admin assistant full time  LEISURE: enjoys [place]  HAND DOMINANCE: right   PRIOR LEVEL OF FUNCTION: Independent  PATIENT GOALS: 1. Get swelling under control     2. Limit progression   OBJECTIVE: Note: Objective measures were completed at Evaluation unless otherwise noted.  COGNITION:  Overall cognitive status: Within functional limits for tasks assessed   OBSERVATIONS / OTHER ASSESSMENTS: Moderate-Sever, stage II, BLE Lipolymphedema   POSTURE: WNL  BLE ROM: Flexion limited at hips, knees and ankles by skin approximation 2/2 girth/ body habitus  LE MMT: WNL  LYMPHEDEMA ASSESSMENTS:   SURGERY TYPE/DATE: N/A. Non cancer related  INFECTIONS: Denies hx of cellulitis  WOUNDS: Denies hx of leg wounds   BLE COMPARATIVE LIMB VOLUMETRICS TBA OT Rx 1  LANDMARK RIGHT    R LEG (A-D) N/A  R THIGH (E-G) ml  R FULL LIMB (A-G) ml  Limb Volume differential (LVD)  %  Volume change since initial %  Volume change overall V  (Blank rows = not tested)  LANDMARK LEFT   L LEG (A-D) N/A  L THIGH (E-G) ml  L  FULL LIMB (A-G) ml  Limb Volume differential (LVD)  %  Volume change since initial %  Volume change overall %  (Blank rows = not tested)    Moderate-Severe, Stage  II, Bilateral Lower Extremity Lipo- Lymphedema 2/2  Severe, Type III Lipedema involving buttocks, abdomen and ankles  Skin  Description Hyper-Keratosis Peau d' Orange Shiny Tight Fibrotic/ Indurated  Fatty Doughy Spongy/ boggy       x x x x   Skin dry Flaky WNL Macerated   mild      Color Redness Varicosities Blanching Hemosiderin Stain Mottled    x    x   Odor Malodorous Yeast Fungal infection  WNL      x   Temperature Warm Cool wnl    x     Pitting Edema   1+ 2+ 3+ 4+ Non-pitting         x   Girth Symmetrical Asymmetrical                   Distribution    R>L Toes to groin, abdomen, buttocks    Stemmer Sign Positive Negative   +    Lymphorrhea History Of:  Present Absent     x    Wounds History Of Present Absent Venous Arterial Pressure Sheer     x        Signs of Infection Redness Warmth Erythema Acute Swelling Drainage Borders                    Sensation Light Touch Deep pressure Hypersensitivty   Impaired In tact Impaired In tact Absent Impaired    x  x  x    Nails WNL   Fungus nail dystrophy   x     Hair Growth Symmetrical Asymmetrical   x    Skin Creases Base of toes  Ankles   Base of Fingers knees       Abdominal pannus Thigh Lobules  Face/neck    x  x x x     GAIT: Distance walked: >500' Assistive device utilized: Single point cane Level of assistance: Modified independence Comments: extra time needed  LYMPHEDEMA LIFE IMPACT SCALE (LLIS): 57.35% (The extent to which lymphedema related problems affected your life during the past week.)                                                                                                                          TREATMENT DATE: Initial eval 07/12/23     Pt edu PATIENT EDUCATION:  Discussed differential diagnoses for  various swelling disorders. Provided basic level education regarding lymphatic structure and function, etiology, onset patterns, stages of progression, and prevention to limit infection risk, worsening condition and further functional decline. Pt edu for aught interaction between blood circulatory system and lymphatic circulation.Discussed  impact of gravity and co-morbidities on lymphatic function. Outlined Complete Decongestive Therapy (CDT)  as standard of care and provided in depth information regarding 4 primary components of Intensive and Self Management Phases, including Manual Lymph Drainage (MLD), compression wrapping and garments, skin care, and therapeutic exercise. Samuel Crock discussion with re need for frequent attendance and high burden of care when caregiver is needed, impact of co morbidities. We discussed  the chronic, progressive nature of lymphedema and Importance of daily, ongoing LE self-care essential for limiting progression  and infection risk.  Person educated: Patient  Education method: Explanation, Demonstration, and Handouts Education comprehension: verbalized understanding, returned demonstration, verbal cues required, and needs further education  HOME EXERCISE PROGRAM: Lymphedema Self-Care home program  BLE lymphatic pumping there ex using- 1 sets of 10 reps, each exercise in order-  1-2 x daily, bilaterally Simple self MLD 1 x daily Daily skin care to increase hydration, skin mobility and decrease infection risk- can be done during MLD Intensive Phase CDT: Compression wraps 23/7 until garment fitting complete Self-management Phase CDT: Custom-made gradient compression garments and HOS devices are medically necessary because they are uniquely sized and shaped to fit the exact dimensions of the affected extremities, and to provide appropriate medical grade, graduated compression essential for optimally managing chronic, progressive lymphedema. Multiple custom compression garments  are needed to ensure proper hygiene to limit infection risk. Custom compression garments should be replaced q 3-6 months When worn consistently for optimal lipo-lymphedema self-management over time. HOS devices, medically necessary to limit fibrosis buildup in tisse, should be replaced q 2 years and PRN when worn out.     ASSESSMENT:  CLINICAL IMPRESSION: Yides Saidi is a 63 yo female presenting with moderate to sever, stage II, lipo-lymphedema 2/2 to Type III LIPEDEMA, Class III Obesity, and suspected venous insufficiency. Her condition involves buttocks, abdomen and bilateral lower extremities. It is exacerbated by joint inflammation, obstructive sleep apnea and hypertension. This patient has a complex constellation of contributing factors.  Lipedema typically affects women and is characterized by painful, symmetrical, excessive deposition of fat below the waist in the legs, thighs and buttocks resulting in a disproportionate waist to hip ratio. It may also include the arms. Unlike lymphedema where swelling extends to the feet and presents as asymmetrical, the feet are spared and cuff-like accumulation of thickened, fibrotic, fatty tissue is noted at distal legs. Accompanying class III obesity favors the development of lipo-lymphedema, and/ or obesity-induced lymphedema. As lipedema progresses fatty fibrosis accumulates in the subcutis putting pressure on delicate lymphatics resulting in excessive lymph accumulation and progressive lymphedema. The combined condition of lipo-lymphedema is difficult to treat as lipedema does not typically respond well to Complete Decongestive Therapy (CDT), while lymphedema may respond to varying degrees. Well fitting, comfortable compression garments that do not constrict or roll down often provide pain relief and enable improved functional performance. An advanced, sequential, pneumatic , compression device, The Flexitouch from [employer], may reduce pain and  discomfort while helping to reduce lymphatic congestion by incorporating anatomically correct lymphatic channels into the device garments.  Extreme obesity can cause lower extremity lymphedema, termed obesity-induced lymphedema (OIL). Obesity-induced lymphedema is secondary lymphedema that may occur once an individual's body mass index (BMI) exceeds 40. Ms Womble-Miller's BMI measures 55.6 today. The risk of lymphatic dysfunction increases with elevated BMI and is almost universal once BMI exceeds 60. Patients with obesity-induced lymphedema also may develop areas of massive, localized lymphedema. Individuals with OIL are in an unfavorable cycle of weight gain and lymphatic injury. As BMI increases lymphedema worsens, ambulation becomes more difficult, and BMI further rises. The fundamental treatment for OIL is weight loss.   Ms. Gerhardt Knudsen will benefit from skilled Occupational Therapy to reduce limb swelling and associated pain, to limit progression and infection risk. BLE lymphedema limits functional performance in all occupational domains, including functional mobility and ambulation,  basic and instrumental ADLs, productive activities, leisure pursuits, social participation and quality of life. This patient will benefit from modified Intensive and Self Management Phase CDT .  In addition to MLD, ther ex, skin care and compression bandaging below the knees, Pt will be fitted with custom knee length compression stockings opaired with off the shelf , capri length compression leggings. If insurance benefits allow, she will undergo a trial in the clinic with the advanced Flexitouch device in an effort to reduce discomfort and reduce infection risk. Without skilled OT Li-lymphedema will worsen over time and further functional decline is expected.    OBJECTIVE IMPAIRMENTS: Abnormal gait, decreased balance, decreased knowledge of condition, decreased knowledge of use of DME, decreased mobility, difficulty  walking, decreased ROM, decreased strength, increased edema, obesity, pain, and lymphedema -related pain and chronic limb swelling.   ACTIVITY LIMITATIONS: Mobility and functional ambulation limitations:  abnormal gait pattern, carrying, lifting, bending, sitting, standing, squatting, stairs, transfers, and bed mobility Basic and instrumental ADLs (reaching feet and distal legs to groom nails, inspect skin, apply lotion, bathe lower body, difficulty with LB dressing, fitting shoes and socks, impaired sleeping, meal prep, standing to cook, driving, shopping, yard work, house work Paediatric nurse activities: work related activities requiring extended standing, walking, sitting; careing for others Social participation in the community, socializing with others, LE affects my body image completely, interferes w intimacy Leisure pursuits requiring extended standing, walking, sitting  PERSONAL FACTORS: Age, Past/current experiences, Time since onset of injury/illness/exacerbation, and 3+ comorbidities: lipedema, class III Obesity, OSA, artitis are also affecting patient's functional outcome.   REHAB POTENTIAL:Pt has difficulty reaching feet, so requires assistance applying and removing multi-layer compression bandaging at home daily between OT visits to achieve  optimal limb volume reduction and functional outcomes. Without consistent, daily caregiver assistance with compression wrapping throughout CDT prognosis is poor. With daily assistance with wrapping, prognosis is fair due to BMI  and Lipedema co-morbidities   EVALUATION COMPLEXITY: Complex  GOALS: Goals reviewed with patient? Yes  SHORT TERM GOALS: Target date: 4th OT Rx visit   Pt will demonstrate understanding of lymphedema precautions and prevention strategies with modified independence using a printed reference to identify at least 5 precautions and discussing how s/he may implement them into daily life to reduce risk of progression with extra  time. Baseline:Max A Goal status: INITIAL  2.  Pt will be able to apply multilayer, knee length, gradient, compression wraps to one leg at a time from toes to below knee with Max caregiver A x 1 to decrease limb volume, to limit infection risk, and to limit lymphedema progression.  Baseline: Dependent Goal status: INITIAL  LONG TERM GOALS: Target date: 08/08/23 1.Given this patient's Intake score of 57.35 % on the Lymphedema Life Impact Scale (LLIS), patient will experience a reduction of at least 10 points in her perceived level of functional impairment resulting from lymphedema to improve functional performance and quality of life (QOL). Baseline: 57.35 % Goal status: INITIAL  2.  Pt will achieve at least a 10% volume reduction in B legs to return limb to typical size and shape, to limit infection risk and LE progression, to decrease pain, to improve function. Baseline: Dependent Goal status: INITIAL  3.  Pt will obtain appropriate compression garments/devices and achieve modified independence (extra time + assistive devices) with donning/doffing to optimize limb volume reductions and limit LE progression over time. Baseline: Dependent Goal status: INITIAL  During Intensive phase CDT, with modified independence, Pt will achieve at least 85% compliance with all lymphedema self-care home program components, including daily skin care, compression wraps and /or garments, simple self MLD and lymphatic pumping therex to habituate LE  self care protocol  into ADLs for optimal LE self-management over time. Baseline: Dependent Goal status: INITIAL PLAN:  OT FREQUENCY: 2x/week and PRN  PT DURATION: other: 18 weeks and PRN  PLANNED INTERVENTIONS: Complete Decongestive Therapy (CDT): Manual Lymphatic Drainage (MLD) , Skin care, ther ex, gradient [id number]-Therapeutic exercises, 97530- Therapeutic activity, 97535- Self Care, [id number]- Manual therapy, Patient/Family education, Manual lymph  drainage, Compression bandaging, DME instructions, and trial with advanced, sequential, pneu,matic compression device ([employer] Flexitouch) Custom-made gradient compression garments and HOS devices are medically necessary because they are uniquely sized and shaped to fit the exact dimensions of the affected extremities, and to provide appropriate medical grade, graduated compression essential for optimally managing chronic, progressive lymphedema. Multiple custom compression garments are needed to ensure proper hygiene to limit infection risk. Custom compression garments should be replaced q 3-6 months When worn consistently for optimal lipo-lymphedema self-management over time. HOS devices, medically necessary to limit fibrosis buildup in tisse, should be replaced q 2 years and PRN when worn out.   PLAN FOR NEXT SESSION:  Pt/ caregiver edu BLE cmparative limb volumetrics  Knee length, Multilayer compression wraps to one leg only-Pt's choice.   Arnold Bicker, MS, OTR/L, CLT-LANA 07/14/23 9:19 AM

## 2023-07-14 NOTE — Assessment & Plan Note (Signed)
 Patient is on Ozempic  weekly with no GI side effects except early satiety.  She does not need a refill today because she got off schedule with her medication around tim with her surgery. Patient has since gotten back on a regular weekly schedule of medication.

## 2023-07-14 NOTE — Assessment & Plan Note (Signed)
 Patient is on zoloft  daily.  No SI or HI mentioned.  Symptoms well managed on current medication dosage.  No change in med but refill needed.

## 2023-07-14 NOTE — Assessment & Plan Note (Signed)
 Anthropometric Measurements Height: 5' 4 (1.626 m) Weight: (!) 325 lb (147.4 kg) BMI (Calculated): 55.76 Weight at Last Visit: 321 lb Weight Lost Since Last Visit: 0 Weight Gained Since Last Visit: 4 Starting Weight: 333 lb Total Weight Loss (lbs): 8 lb (3.629 kg) Body Composition  Body Fat %: 66.9 % Fat Mass (lbs): 217.6 lbs Muscle Mass (lbs): 102.4 lbs Visceral Fat Rating : 28 Other Clinical Data Fasting: no Labs: no Today's Visit #: 43 Starting Date: 04/07/20 Comments: Cat 2

## 2023-07-14 NOTE — Addendum Note (Signed)
 Addended by: Arlie Lain on: 07/14/2023 11:05 AM   Modules accepted: Orders

## 2023-07-18 ENCOUNTER — Encounter: Payer: Self-pay | Admitting: *Deleted

## 2023-07-19 ENCOUNTER — Ambulatory Visit: Payer: 59 | Admitting: Neurology

## 2023-07-21 ENCOUNTER — Encounter: Payer: Self-pay | Admitting: Neurology

## 2023-07-21 ENCOUNTER — Ambulatory Visit: Admitting: Neurology

## 2023-07-21 VITALS — BP 105/66 | HR 66 | Ht 64.0 in | Wt 335.0 lb

## 2023-07-21 DIAGNOSIS — Z8669 Personal history of other diseases of the nervous system and sense organs: Secondary | ICD-10-CM | POA: Diagnosis not present

## 2023-07-21 DIAGNOSIS — R419 Unspecified symptoms and signs involving cognitive functions and awareness: Secondary | ICD-10-CM

## 2023-07-21 DIAGNOSIS — R251 Tremor, unspecified: Secondary | ICD-10-CM

## 2023-07-21 DIAGNOSIS — Z820 Family history of epilepsy and other diseases of the nervous system: Secondary | ICD-10-CM

## 2023-07-21 DIAGNOSIS — Z9181 History of falling: Secondary | ICD-10-CM

## 2023-07-21 NOTE — Progress Notes (Signed)
 Subjective:    Patient ID: Angel Watkins is a 63 y.o. female.  HPI    Angel Fairy, MD, PhD [facility] [address] [address] [place], [place] [address]  Dear Angel Watkins,  I saw your patient, Angel Watkins, upon your kind request in my neurologic clinic today for initial consultation of her memory loss.  The patient is unaccompanied today. As you know, [name] is a 63 year old female with an underlying medical history of arthritis, back pain, diabetes, lower extremity swelling, lymphedema, vitamin D  deficiency, fibromyalgia, hypertension, anxiety, palpitations, spinal stenosis, sleep apnea, and morbid obesity with a BMI of over 50, status post gastric bypass surgery many years ago, who reports an intermittent had tremor in both hands, right more than left for over 1 year. Tremor is more noticeable when she holds something such as a cup of coffee.  She reports having fallen, she has fallen trying to get down some steps.  She usually uses a cane, has not used a walker yet.  She has a single-point cane which she has been using for the past 2 years.  She also reports difficulty with her short-term memory and name recall for the past 1+ year.  She reports that she has not had any workup through your office for her cognitive concerns.   She reports a family history of Parkinson's disease or parkinsonism affecting her father and paternal grandmother.  She had blood work with Dr. Marcelyn Servant recently and I reviewed test results in her electronic chart.  She was diagnosed with sleep apnea many years ago and had a CPAP machine but then had another sleep study several years ago and was told she no longer a had sleep apnea.  She does have trouble sleeping at night and takes trazodone, 50 mg at bedtime.  She has nocturia about once per average night and reports occasional morning headaches.  She has a history of snoring occasionally.  She works as an Recruitment consultant and has not had any trouble performing her job duties.  She has had some difficulty sequencing work but has not had any trouble at work.  She still drives, she has not had any trouble navigating.  She reports that she will need sedation for an MRI as she has severe claustrophobia. I reviewed your office note from 03/16/2023.  She had blood work through your office at the time including CBC, CMP, lipid panel, TSH, and vitamin D . She had a remote head CT without contrast and cervical spine CT without contrast on 06/01/2014 and I reviewed the results:  IMPRESSION: 1.  No evidence for acute intracranial abnormality. 2. Mid cervical spondylosis. No evidence for acute cervical spine abnormality. 3. Mid cervical spinal stenosis most notable at C5-6.  She follows with pain management through [facility].  She is on Butrans  patch.  Her Past Medical History Is Significant For: Past Medical History:  Diagnosis Date   Anxiety    Arthritis    Back pain    Bilateral swelling of feet    Bulging lumbar disc    and cervical   Diabetes mellitus without complication (HCC)    Fibromyalgia    Hypertension    Joint pain    Lymphedema    Palpitations    Sleep apnea    Spinal stenosis    Wears glasses     Her Past Surgical History Is Significant For: Past Surgical History:  Procedure Laterality Date   ABDOMINAL HYSTERECTOMY  ANAL FISSURE REPAIR     ANTERIOR CERVICAL DECOMP/DISCECTOMY FUSION N/A 12/03/2018   Procedure: Cervical five-six  Anterior cervical decompression/discectomy/fusion;  Surgeon: Cannon Champion, MD;  Location: [facility] OR;  Service: Neurosurgery;  Laterality: N/A;   BUNIONECTOMY Left 2009   GASTRIC BYPASS     LUMBAR LAMINECTOMY     RADIOLOGY WITH ANESTHESIA N/A 12/14/2017   Procedure: MRI WITH ANESTHESIA, LUMBAR SPINE WITHOUT CONTRAST, CERVICAL SPINE WITHOUT CONTRAST;  Surgeon: Radiologist, Medication, MD;  Location: [facility] OR;  Service: Radiology;  Laterality: N/A;    RADIOLOGY WITH ANESTHESIA N/A 10/08/2019   Procedure: MRI WITH ANESTHESIA    LUMBAR WITHOUT;  Surgeon: Radiologist, Medication, MD;  Location: [facility] OR;  Service: Radiology;  Laterality: N/A;   right shoulder total replacement      Her Family History Is Significant For: Family History  Problem Relation Age of Onset   Hypertension Mother    Cancer Mother    Sleep apnea Mother    Obesity Mother    Diabetes Father    Hypertension Father    Stroke Father    Parkinson's disease Father    Dementia Maternal Grandmother    Parkinson's disease Paternal Grandmother    Dementia Paternal Grandmother     Her Social History Is Significant For: Social History   Socioeconomic History   Marital status: Married    Spouse name: Not on file   Number of children: Not on file   Years of education: Not on file   Highest education level: Not on file  Occupational History   Occupation: Environmental health practitioner  Tobacco Use   Smoking status: Never   Smokeless tobacco: Never  Vaping Use   Vaping status: Never Used  Substance and Sexual Activity   Alcohol use: Yes    Alcohol/week: 1.0 standard drink of alcohol    Types: 1 Glasses of wine per week   Drug use: No   Sexual activity: Not on file  Other Topics Concern   Not on file  Social History Narrative   Pt lives with family    Pt works    Social Drivers of Corporate investment banker Strain: Low Risk  (05/12/2023)   Received from [facility]   Overall Financial Resource Strain (CARDIA)    Difficulty of Paying Living Expenses: Not hard at all  Food Insecurity: No Food Insecurity (05/12/2023)   Received from [facility]   Hunger Vital Sign    Within the past 12 months, you worried that your food would run out before you got the money to buy more.: Never true    Within the past 12 months, the food you bought just didn't last and you didn't have money to get more.: Never true  Transportation Needs: No  Transportation Needs (05/12/2023)   Received from [facility]    In the past 12 months, has lack of transportation kept you from medical appointments or from getting medications?: No    Lack of Transportation (Non-Medical): No  Physical Activity: Not on file  Stress: Not on file  Social Connections: Not on file    Her Allergies Are:  Allergies  Allergen Reactions   Adhesive [Tape] Itching and Other (See Comments)    Bruising    Furosemide Other (See Comments)    Furosemide 40mg  and 80mg  cause leg cramps   Grapeseed Extract [Nutritional Supplements] Itching    muscadine grapes cause facial itching   Meloxicam Swelling  Morphine Other (See Comments)    Headache   Percocet [Oxycodone -Acetaminophen ] Itching   Amlodipine Palpitations        Penicillins Itching and Rash    Has patient had a PCN reaction causing immediate rash, facial/tongue/throat swelling, SOB or lightheadedness with hypotension: No Has patient had a PCN reaction causing severe rash involving mucus membranes or skin necrosis: Yes Has patient had a PCN reaction that required hospitalization: Yes Has patient had a PCN reaction occurring within the last 10 years: No If all of the above answers are NO, then may proceed with Cephalosporin use.   :   Her Current Medications Are:  Outpatient Encounter Medications as of 07/21/2023  Medication Sig   atorvastatin  (LIPITOR) 20 MG tablet Take 20 mg by mouth daily.   buprenorphine  (BUTRANS ) 10 MCG/HR PTWK Place 1 patch onto the skin once a week.   Cholecalciferol (VITAMIN D3) 125 MCG (5000 UT) CAPS Take 1 capsule (5,000 Units total) by mouth daily.   Cyanocobalamin (VITAMIN B-12 PO) Take 1 tablet by mouth daily.    diclofenac  (VOLTAREN ) 75 MG EC tablet Take 75 mg by mouth 2 (two) times daily.   ferrous sulfate 325 (65 FE) MG tablet Take 325 mg by mouth daily with breakfast.   irbesartan  (AVAPRO ) 75 MG tablet Take 75 mg by  mouth daily.   omeprazole (PRILOSEC) 20 MG capsule Take 20 mg by mouth daily.   Semaglutide , 1 MG/DOSE, 4 MG/3ML SOPN Inject 1 mg as directed once a week.   sertraline  (ZOLOFT ) 50 MG tablet Take 1.5 tablets (75 mg total) by mouth daily.   torsemide  (DEMADEX ) 20 MG tablet Take 20 mg by mouth daily.    No facility-administered encounter medications on file as of 07/21/2023.  :   Review of Systems:  Out of a complete 14 point review of systems, all are reviewed and negative with the exception of these symptoms as listed below:  Review of Systems  Neurological:        Pt here for memory decline Pt states long term memory is worse Pt states tremors in both hands        Objective:  Neurological Exam  Physical Exam Physical Examination:   Vitals:   07/21/23 1115  BP: 105/66  Pulse: 66    General Examination: The patient is a very pleasant 63 y.o. female in no acute distress. She appears well-developed and well-nourished and well groomed.   HEENT: Normocephalic, atraumatic, pupils are equal, round and reactive to light, extraocular tracking is good without limitation to gaze excursion or nystagmus noted. Hearing is grossly intact. Face is symmetric with normal facial animation. Speech is clear with no dysarthria noted. There is no hypophonia. There is no lip, neck/head, jaw or voice tremor. Neck is supple with full range of passive and active motion. There are no carotid bruits on auscultation. Oropharynx exam reveals: mild mouth dryness, adequate dental hygiene, moderate airway crowding.  Neck circumference 14-7/8 inches.  No significant overbite.  Tongue protrudes centrally and palate elevates symmetrically.   Chest: Clear to auscultation without wheezing, rhonchi or crackles noted.  Heart: S1+S2+0, regular and normal without murmurs, rubs or gallops noted.   Abdomen: Soft, non-tender and non-distended.  Extremities: There is significant swelling in the lower extremities  bilaterally distally.   Skin: Warm and dry without trophic changes noted.   Musculoskeletal: exam reveals no obvious joint deformities.   Neurologically:  Mental status: The patient is awake, alert and oriented in all 4 spheres. Her  immediate and remote memory, attention, language skills and fund of knowledge are quite appropriate.  Speech is clear with normal prosody and enunciation. Thought process is linear. Mood is normal and affect is normal.      07/21/2023   11:17 AM  Montreal Cognitive Assessment   Visuospatial/ Executive (0/5) 4  Naming (0/3) 2  Attention: Read list of digits (0/2) 2  Attention: Read list of letters (0/1) 1  Attention: Serial 7 subtraction starting at 100 (0/3) 2  Language: Repeat phrase (0/2) 2  Language : Fluency (0/1) 0  Abstraction (0/2) 2  Delayed Recall (0/5) 0  Orientation (0/6) 5  Total 20   On 07/21/2023: CDT: 4/4, AFT: 7/min.  Cranial nerves II - XII are as described above under HEENT exam.  Motor exam: Normal bulk, strength and tone is noted. There is no obvious action or resting tremor.  No significant postural or intention tremor. Fine motor skills and coordination: Intact with finger taps, hand movements and rapid alternating patting in both upper extremities, normal foot taps bilaterally in the lower extremities.    Cerebellar testing: No dysmetria or intention tremor. There is no truncal or gait ataxia.  Reflexes 2+ in the upper extremities and diminished in the lower extremities. Sensory exam: intact to light touch in the upper and lower extremities.  Gait, station and balance: She stands with difficulty and pushes herself up.  She is able to walk without her cane, no shuffling noted, has preserved arm swing, walks slightly wide-based, she brought a single-point cane.  She turns with mild difficulty.  Assessment and Plan:  In summary, Miabella Shannahan is a 63 year old female with an underlying medical history of arthritis, back pain,  diabetes, lower extremity swelling, lymphedema, vitamin D  deficiency, fibromyalgia, hypertension, anxiety, palpitations, spinal stenosis, sleep apnea, and morbid obesity with a BMI of over 50, status post gastric bypass surgery many years ago, who presents for evaluation of her cognitive complaints of at least 1 years duration as well as intermittent tremors of at least 1 years duration and family history of Parkinson's disease or parkinsonism reported.  History and examination are not telltale for Parkinson's disease or parkinsonism.  Exam does not favor essential tremor either.  She does not have much in the way of tremor today and neurological exam is reassuring in that regard.  She has a mildly abnormal MoCA test today but cognitive complaints are vague, could be more of a multifactorial etiology.  Will investigate things further with a brain MRI with and without contrast, she requests to be sedated for this, I will order an MRI under sedation at [facility].  She had her last MRI of the shoulder at [facility] under sedation.  We will proceed with a referral to neuropsychology for more formal and in-depth cognitive testing.  I would like to proceed with a sleep study to investigate her sleep apnea again and see if she still has sleep apnea and may benefit from treatment.  She is advised that if she still has obstructive sleep apnea, I will likely recommend CPAP or AutoPap therapy for her.  She is not sure if she would be interested in going back on PAP therapy but at this point she is agreeable to pursuing further testing.  With her next checkup with your office she is advised to make sure her vitamin B12 level is checked, other blood test results are fairly up-to-date in her chart.   We will plan a follow-up after testing.  This was an extended visit of high complexity of over 60 minutes with copious record review involved, considerable counseling and coordination of care and addressing  multiple issues.   Below is a summary of my recommendations and our discussion points based on today's visit, her chart review and examination.  She was given these instructions verbally during the visit and in writing in her after visit summary which she can access electronically.  << Blood work (which you can have through your primary care, I would recommend checking a vitamin B12 level at the time).  We will do a brain scan, called MRI and call you with the test results. We will have to schedule you for this on a separate date. This test requires authorization from your insurance, and we will take care of the insurance process. We will request a formal cognitive test called neuropsychological evaluation which is done by a licensed neuropsychologist. We will make a referral in that regard.  I recommend we proceed with a sleep study to rule out obstructive sleep apnea.  If you have obstructive sleep apnea I will likely recommend treatment with a CPAP or AutoPap machine. I would recommend you start using a walker for you gait safety as you have fallen before and you had some trouble with your turns today. We will plan to follow-up after testing and we will keep you posted as to your test results by phone call in the interim. >>  Thank you very much for allowing me to participate in the care of this nice patient. If I can be of any further assistance to you please do not hesitate to call me at [phone].  Sincerely,   Angel Fairy, MD, PhD

## 2023-07-21 NOTE — Patient Instructions (Addendum)
 It was nice to meet you today.  I do not see much in the way of tremors today, your history and exam do not favor Parkinson's disease thankfully.  You have complaints of memory loss: memory loss or changes in cognitive function can have many reasons and does not always mean you have dementia.  There are several conditions and situations that can contribute to subjective or objective memory loss.  These factors include: depression, stress, sleep deprivation or poor sleep from insomnia or sleep apnea, dehydration, fluctuation in blood sugar values, thyroid or electrolyte dysfunction, medication effects from sedating medications or narcotic pain medication for example and certain vitamin deficiencies such as vitamin B12 deficiency, and anemia. Dementia can be caused by stroke, brain atherosclerosis or brain vascular disease due to vascular risk factors (smoking, high blood pressure, high cholesterol, obesity and uncontrolled diabetes), certain degenerative brain disorders (including Parkinson's disease and Multiple sclerosis) and by Alzheimer's disease or other, more rare and sometimes hereditary causes.   Here is what I would recommend:   Blood work (which you can have through your primary care, I would recommend checking a vitamin B12 level at the time).  We will do a brain scan, called MRI and call you with the test results. We will have to schedule you for this on a separate date. This test requires authorization from your insurance, and we will take care of the insurance process. We will request a formal cognitive test called neuropsychological evaluation which is done by a licensed neuropsychologist. We will make a referral in that regard.  I recommend we proceed with a sleep study to rule out obstructive sleep apnea.  If you have obstructive sleep apnea I will likely recommend treatment with a CPAP or AutoPap machine. I would recommend you start using a walker for you gait safety as you have fallen  before and you had some trouble with your turns today. We will plan to follow-up after testing and we will keep you posted as to your test results by phone call in the interim.

## 2023-07-25 ENCOUNTER — Encounter: Payer: Self-pay | Admitting: Psychology

## 2023-07-26 ENCOUNTER — Ambulatory Visit: Admitting: Occupational Therapy

## 2023-07-26 ENCOUNTER — Telehealth: Payer: Self-pay | Admitting: Neurology

## 2023-07-26 ENCOUNTER — Encounter: Payer: Self-pay | Admitting: Occupational Therapy

## 2023-07-26 DIAGNOSIS — I890 Lymphedema, not elsewhere classified: Secondary | ICD-10-CM

## 2023-07-26 NOTE — Telephone Encounter (Signed)
 [facility] [employer] pending

## 2023-07-26 NOTE — Therapy (Signed)
 OUTPATIENT OCCUPATIONAL THERAPY TREATMENT  BILATERAL LOWER EXTREMITY LIPO-LYMPHEDEMA  Patient Name: Angel [place] MRN: [id number] DOB:03-25-1960, 63 y.o., female Today's Date: 07/26/2023  END OF SESSION:   OT End of Session - 07/26/23 0813     Visit Number 2    Number of Visits 36    Date for OT Re-Evaluation 10/10/23    OT Start Time 0800    OT Stop Time 0910    OT Time Calculation (min) 70 min    Activity Tolerance Patient tolerated treatment well;No increased pain    Behavior During Therapy WFL for tasks assessed/performed            Past Medical History:  Diagnosis Date   Anxiety    Arthritis    Back pain    Bilateral swelling of feet    Bulging lumbar disc    and cervical   Diabetes mellitus without complication (HCC)    Fibromyalgia    Hypertension    Joint pain    Lymphedema    Palpitations    Sleep apnea    Spinal stenosis    Wears glasses    Past Surgical History:  Procedure Laterality Date   ABDOMINAL HYSTERECTOMY     ANAL FISSURE REPAIR     ANTERIOR CERVICAL DECOMP/DISCECTOMY FUSION N/A 12/03/2018   Procedure: Cervical five-six  Anterior cervical decompression/discectomy/fusion;  Surgeon: Cheryle Debby LABOR, MD;  Location: [facility] OR;  Service: Neurosurgery;  Laterality: N/A;   BUNIONECTOMY Left 2009   GASTRIC BYPASS     LUMBAR LAMINECTOMY     RADIOLOGY WITH ANESTHESIA N/A 12/14/2017   Procedure: MRI WITH ANESTHESIA, LUMBAR SPINE WITHOUT CONTRAST, CERVICAL SPINE WITHOUT CONTRAST;  Surgeon: Radiologist, Medication, MD;  Location: [facility] OR;  Service: Radiology;  Laterality: N/A;   RADIOLOGY WITH ANESTHESIA N/A 10/08/2019   Procedure: MRI WITH ANESTHESIA    LUMBAR WITHOUT;  Surgeon: Radiologist, Medication, MD;  Location: [facility] OR;  Service: Radiology;  Laterality: N/A;   right shoulder total replacement     Patient Active Problem List   Diagnosis Date Noted   Morbid obesity (HCC) 07/14/2023   Hyperlipidemia associated with type 2 diabetes mellitus  (HCC) 05/29/2023   SOBOE (shortness of breath on exertion) 05/24/2023   Lymphedema 03/08/2023   Anxiety and depression 12/06/2022   Eating disorder 12/02/2020   Vitamin D  deficiency 12/02/2020   Type 2 diabetes mellitus with hyperglycemia, without long-term current use of insulin  (HCC) 07/20/2020   Hypertension associated with diabetes (HCC) 07/20/2020   Class 3 severe obesity with serious comorbidity and body mass index (BMI) of 50.0 to 59.9 in adult 07/20/2020   Cervical radiculopathy 12/03/2018    PCP: Gerard Roys, PA ([facility], [place], [place])  REFERRING PROVIDER: Toribio Kerry, MD  REFERRING DIAG: I89.0  THERAPY DIAG:  Lymphedema, not elsewhere classified  Rationale for Evaluation and Treatment: Rehabilitation  ONSET DATE: 2002 s/p gastric bypass.  SUBJECTIVE:  SUBJECTIVE STATEMENT: Angel [place] returns to OT to commence Lymphedema treatment to BLE. Pt has no new complaints and denies LE-related leg pain. Ive been using my compression machine (Tactile NIMBL) every day since I got it last week.  (07/12/23 OT Initial Eval: Angel [place] is referred to Occupational Therapy by Toribio Kerry, MD at [facility] for evaluation and treatment of BLE/BLQ Lipo-lymphedema.Pt reports she first noticed leg swelling after gastric bypass surgery. She states, I lost 100 pounds, but my legs just got bigger instead of going down. Pt reports lower extremity and lower quadrant ( buttocks, abdomen, hips) worsened continues to worsen over time. She is unable to fit off the shelf compression stockings. Diuretics, prescribed in the past, do not reduce swelling. Pt describes lower extremity/ lower quadrant pain and discomfort as heaviness. She rates  this painful sensation as 6-7/10. Pt reports her mother had some  symptoms of lipedema, but she is unaware of other relatives that may have had lymphedema or lipedema. Pt denies hx of cellulitis and blood clots. Pt reports her mother had the same body type with a small waist and large, fatty hips and legs.    PERTINENT HISTORY:   Anxiety    Arthritis    Back pain    Bilateral swelling of feet    Bulging lumbar disc    and cervical   Diabetes mellitus without complication (HCC)    Fibromyalgia    Hypertension    Joint pain    Palpitations    Sleep apnea    Spinal stenosis   Gastric bypass 2002   Class 3 severe obesity with serious comorbidity and body mass index (BMI) of 50.0 to 59.9 in adult (BMI measured this date 55.6)   Cervical radiculopathy    PAIN:  Are you having pain? No  PRECAUTIONS: Other: LYMPHEDEMA PRECAUTIONS  WEIGHT BEARING RESTRICTIONS: No  FALLS:  Has patient fallen in last 6 months? Yes. Number of falls 1; Just got a new scooter. It caught my shirt and pulled me down. But I wasn't hurt.  LIVING ENVIRONMENT: Lives with: lives with their spouse Lives in: House/apartment Stairs: Yes; External: 3 steps; can reach both Has following equipment at home: Single point cane, shower chair, and hand held shower  OCCUPATION: Advertising account executive full time  LEISURE: enjoys visiting vineyards  HAND DOMINANCE: right   PRIOR LEVEL OF FUNCTION: Independent  PATIENT GOALS: 1. Get swelling under control     2. Limit progression   OBJECTIVE: Note: Objective measures were completed at Evaluation unless otherwise noted.  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 Non  cancer related  INFECTIONS: Denies hx of cellulitis  WOUNDS: Denies hx of leg wounds   BLE COMPARATIVE LIMB VOLUMETRICS : Initial 07/26/23  LANDMARK RIGHT  (dominant)  R LEG (A-D) 7303.9 ml  R THIGH (E-G) ml  R FULL LIMB (A-G) ml  Limb Volume differential (LVD)  LVD measures 1.51 % , L>R  Volume change since last measured %  Volume change since initial V  (Blank rows = not tested)  LANDMARK LEFT   L LEG (A-D) 7193.7 ml  L THIGH (E-G) ml  L FULL LIMB (A-G) ml  Limb Volume differential (LVD)  %  Volume change since initial %  Volume change overall %  (Blank rows = not tested)    Moderate-Severe, Stage  II, Bilateral Lower Extremity Lipo- Lymphedema 2/2  Severe, Type III Lipedema involving buttocks, abdomen and ankles  Skin  Description Hyper-Keratosis Peau d' Orange Shiny Tight Fibrotic/ Indurated Fatty Doughy Spongy/ boggy    x   x x x x   Skin dry Flaky WNL Macerated   mild      Color Redness Varicosities Blanching Hemosiderin Stain Mottled    x    x   Odor Malodorous Yeast Fungal infection  WNL      x   Temperature Warm Cool wnl    x     Pitting Edema   1+ 2+ 3+ 4+ Non-pitting         x   Girth Symmetrical Asymmetrical                   Distribution   x  Toes to groin, abdomen, buttocks    Stemmer Sign Positive Negative   +    Lymphorrhea History Of:  Present Absent     x    Wounds History Of Present Absent Venous Arterial Pressure Sheer     x        Signs of Infection Redness Warmth Erythema Acute Swelling Drainage Borders                    Sensation Light Touch Deep pressure Hypersensitivity   Impaired In tact Impaired In tact Absent Impaired    x  x  x    Nails WNL   Fungus nail dystrophy   x     Hair Growth Symmetrical Asymmetrical   x    Skin Creases Base of toes  Ankles   Base of Fingers knees       Abdominal pannus Thigh Lobules  Face/neck    x  x x x     GAIT: Distance walked: >500' Assistive device utilized:  Single point cane Level of assistance: Modified independence Comments: extra time needed  LYMPHEDEMA LIFE IMPACT SCALE (LLIS): 57.35% (The extent to which lymphedema related problems affected your life during the past week.)                                                                                                                          TREATMENT DATE:  Initial BLE limb volumetrics Pt edu for LE self-care R Leg multilayer compression bandaging  PATIENT EDUCATION:  Continued Pt/ CG edu for lymphedema self care home program throughout session. Topics include outcome of comparative limb volumetrics- starting limb volume differentials (LVDs), technology and gradient techniques used for short stretch, multilayer compression wrapping, simple self-MLD, therapeutic lymphatic pumping exercises, skin/nail care, LE precautions, compression garment recommendations and specifications, wear and care schedule and compression garment donning / doffing w assistive devices. Discussed progress towards all OT goals since  commencing CDT. Discussed detrimental impact of obesity on lower and upper extremity lymphedema over time. Reviewed OT goals for lymphedema care with Pt and discussed progress to date.  All questions answered to the Pt's satisfaction. Good return. Person educated: Patient  Education method: Explanation, Demonstration, and Handouts Education comprehension: verbalized understanding, returned demonstration, verbal cues required, and needs further education  Person educated: Patient  Education method: Explanation, Demonstration, and Handouts Education comprehension: verbalized understanding, returned demonstration, verbal cues required, and needs further education  HOME EXERCISE PROGRAM: Lymphedema Self-Care home program  BLE lymphatic pumping there ex using- 1 sets of 10 reps, each exercise in order-  1-2 x daily, bilaterally Simple self MLD 1 x daily Daily skin care to increase  hydration, skin mobility and decrease infection risk- can be done during MLD Intensive Phase CDT: Compression wraps 23/7 until garment fitting complete Self-management Phase CDT: Custom-made gradient compression garments and HOS devices are medically necessary because they are uniquely sized and shaped to fit the exact dimensions of the affected extremities, and to provide appropriate medical grade, graduated compression essential for optimally managing chronic, progressive lymphedema. Multiple custom compression garments are needed to ensure proper hygiene to limit infection risk. Custom compression garments should be replaced q 3-6 months When worn consistently for optimal lipo-lymphedema self-management over time. HOS devices, medically necessary to limit fibrosis buildup in tissue, should be replaced q 2 years and PRN when worn out.     ASSESSMENT:  CLINICAL IMPRESSION: Initial BLE comparative limb volumetrics reveals essentially symmetrical limb volumes in the legs bilaterally, as is typical of Lipedema. Initial LVD measures 1.51 % , L>R today. Please refer to volumetric chart above for detailed measurements. Applied multilayer compression wraps to R leg altering typical gradient patter, Left foot unwrapped to make sure Pt is able to get slip on tennis shoe on her foot since she is headed to work after this session. Wrapped leg from distal ankle to popliteal fossa with single roll of .04 cm thick Rosidal foam. Utilized 1 8 and  one 10 cm wide short stretch with modified gradient pattern over foam. Provided Pt edu throughout session regarding initial volumetrics and short stretch compression technology. Cont as per POC.   (07/12/23 Initial OT eval: Angel [place] is a 63 yo female presenting with moderate to sever, stage II, lipo-lymphedema 2/2 to Type III LIPEDEMA, Class III Obesity, and suspected venous insufficiency. Her condition involves buttocks, abdomen and bilateral lower extremities. It is  exacerbated by joint inflammation, obstructive sleep apnea and hypertension. This patient has a complex constellation of contributing factors.  Lipedema typically affects women and is characterized by painful, symmetrical, excessive deposition of fat below the waist in the legs, thighs and buttocks resulting in a disproportionate waist to hip ratio. It may also include the arms. Unlike lymphedema where swelling extends to the feet and presents as asymmetrical, the feet are spared and cuff-like accumulation of thickened, fibrotic, fatty tissue is noted at distal legs. Accompanying class III obesity favors the development of lipo-lymphedema, and/ or obesity-induced lymphedema. As lipedema progresses fatty fibrosis accumulates in the subcutis putting pressure on delicate lymphatics resulting in excessive lymph accumulation and progressive lymphedema. The combined condition of lipo-lymphedema is difficult to treat as lipedema does not typically respond well to Complete Decongestive Therapy (CDT), while lymphedema may respond to varying degrees. Well fitting, comfortable compression garments that do not constrict or roll down often provide pain relief and enable improved functional performance. An advanced, sequential, pneumatic , compression device, The  Flexitouch from [employer], may reduce pain and discomfort while helping to reduce lymphatic congestion by incorporating anatomically correct lymphatic channels into the device garments.  Extreme obesity can cause lower extremity lymphedema, termed obesity-induced lymphedema (OIL). Obesity-induced lymphedema is secondary lymphedema that may occur once an individual's body mass index (BMI) exceeds 40. Ms Womble-Miller's BMI measures 55.6 today. The risk of lymphatic dysfunction increases with elevated BMI and is almost universal once BMI exceeds 60. Patients with obesity-induced lymphedema also may develop areas of massive, localized lymphedema. Individuals with  OIL are in an unfavorable cycle of weight gain and lymphatic injury. As BMI increases lymphedema worsens, ambulation becomes more difficult, and BMI further rises. The fundamental treatment for OIL is weight loss.   Ms. Sharren [place] will benefit from skilled Occupational Therapy to reduce limb swelling and associated pain, to limit progression and infection risk. BLE lymphedema limits functional performance in all occupational domains, including functional mobility and ambulation,  basic and instrumental ADLs, productive activities, leisure pursuits, social participation and quality of life. This patient will benefit from modified Intensive and Self Management Phase CDT . In addition to MLD, ther ex, skin care and compression bandaging below the knees, Pt will be fitted with custom knee length compression stockings paired with off the shelf , Capri length compression leggings. If insurance benefits allow, she will undergo a trial in the clinic with the advanced Flexitouch device in an effort to reduce discomfort and reduce infection risk. Without skilled OT Li-lymphedema will worsen over time and further functional decline is expected. )   OBJECTIVE IMPAIRMENTS: Abnormal gait, decreased balance, decreased knowledge of condition, decreased knowledge of use of DME, decreased mobility, difficulty walking, decreased ROM, decreased strength, increased edema, obesity, pain, and lymphedema -related pain and chronic limb swelling.   ACTIVITY LIMITATIONS: Mobility and functional ambulation limitations:  abnormal gait pattern, carrying, lifting, bending, sitting, standing, squatting, stairs, transfers, and bed mobility Basic and instrumental ADLs (reaching feet and distal legs to groom nails, inspect skin, apply lotion, bathe lower body, difficulty with LB dressing, fitting shoes and socks, impaired sleeping, meal prep, standing to cook, driving, shopping, yard work, house work Paediatric nurse activities: work related  activities requiring extended standing, walking, sitting; caring for others Social participation in the community, socializing with others, LE affects my body image completely, interferes w intimacy Leisure pursuits requiring extended standing, walking, sitting  PERSONAL FACTORS: Age, Past/current experiences, Time since onset of injury/illness/exacerbation, and 3+ comorbidities: lipedema, class III Obesity, OSA, arthritis are also affecting patient's functional outcome.   REHAB POTENTIAL:Pt has difficulty reaching feet, so requires assistance applying and removing multi-layer compression bandaging at home daily between OT visits to achieve  optimal limb volume reduction and functional outcomes. Without consistent, daily caregiver assistance with compression wrapping throughout CDT prognosis is poor. With daily assistance with wrapping, prognosis is fair due to BMI  and Lipedema co-morbidities   EVALUATION COMPLEXITY: Complex  GOALS: Goals reviewed with patient? Yes  SHORT TERM GOALS: Target date: 4th OT Rx visit   Pt will demonstrate understanding of lymphedema precautions and prevention strategies with modified independence using a printed reference to identify at least 5 precautions and discussing how s/he may implement them into daily life to reduce risk of progression with extra time. Baseline:Max A Goal status: INITIAL  2.  Pt will be able to apply multilayer, knee length, gradient, compression wraps to one leg at a time from toes to below knee with Max caregiver A x 1 to decrease limb volume,  to limit infection risk, and to limit lymphedema progression.  Baseline: Dependent Goal status: INITIAL  LONG TERM GOALS: Target date: 08/08/23 1.Given this patient's Intake score of 57.35 % on the Lymphedema Life Impact Scale (LLIS), patient will experience a reduction of at least 10 points in her perceived level of functional impairment resulting from lymphedema to improve functional performance  and quality of life (QOL). Baseline: 57.35 % Goal status: INITIAL  2.  Pt will achieve at least a 10% volume reduction in B legs to return limb to typical size and shape, to limit infection risk and LE progression, to decrease pain, to improve function. Baseline: Dependent Goal status: INITIAL  3.  Pt will obtain appropriate compression garments/devices and achieve modified independence (extra time + assistive devices) with donning/doffing to optimize limb volume reductions and limit LE progression over time. Baseline: Dependent Goal status: INITIAL  During Intensive phase CDT, with modified independence, Pt will achieve at least 85% compliance with all lymphedema self-care home program components, including daily skin care, compression wraps and /or garments, simple self MLD and lymphatic pumping therex to habituate LE self care protocol  into ADLs for optimal LE self-management over time. Baseline: Dependent Goal status: INITIAL PLAN:  OT FREQUENCY: 2x/week and PRN  PT DURATION: other: 18 weeks and PRN  PLANNED INTERVENTIONS: Complete Decongestive Therapy (CDT): Manual Lymphatic Drainage (MLD) , Skin care, ther ex, gradient [id number]-Therapeutic exercises, 97530- Therapeutic activity, 97535- Self Care, [id number]- Manual therapy, Patient/Family education, Manual lymph drainage, Compression bandaging, DME instructions, and trial with advanced, sequential, pneu,matic compression device ([employer] Flexitouch) Custom-made gradient compression garments and HOS devices are medically necessary because they are uniquely sized and shaped to fit the exact dimensions of the affected extremities, and to provide appropriate medical grade, graduated compression essential for optimally managing chronic, progressive lymphedema. Multiple custom compression garments are needed to ensure proper hygiene to limit infection risk. Custom compression garments should be replaced q 3-6 months When worn  consistently for optimal lipo-lymphedema self-management over time. HOS devices, medically necessary to limit fibrosis buildup in tissue, should be replaced q 2 years and PRN when worn out.   PLAN FOR NEXT SESSION:  Pt/ caregiver edu BLE comparative limb volumetrics  Knee length, Multilayer compression wraps to one leg only-Pt's choice.   Zebedee Dec, MS, OTR/L, CLT-LANA 07/26/23 9:14 AM

## 2023-07-27 NOTE — Telephone Encounter (Signed)
 [employer] shara: [id number] (exp. 07/26/23 to 10/25/23)

## 2023-07-31 NOTE — Telephone Encounter (Signed)
 Left voicemail for patient to call back to schedule

## 2023-08-02 ENCOUNTER — Ambulatory Visit: Attending: Physician Assistant | Admitting: Occupational Therapy

## 2023-08-02 ENCOUNTER — Other Ambulatory Visit (INDEPENDENT_AMBULATORY_CARE_PROVIDER_SITE_OTHER): Payer: Self-pay | Admitting: Family Medicine

## 2023-08-02 ENCOUNTER — Encounter: Payer: Self-pay | Admitting: Occupational Therapy

## 2023-08-02 DIAGNOSIS — I890 Lymphedema, not elsewhere classified: Secondary | ICD-10-CM | POA: Insufficient documentation

## 2023-08-02 DIAGNOSIS — E1165 Type 2 diabetes mellitus with hyperglycemia: Secondary | ICD-10-CM

## 2023-08-02 NOTE — Therapy (Signed)
 OUTPATIENT OCCUPATIONAL THERAPY TREATMENT  BILATERAL LOWER EXTREMITY LIPO-LYMPHEDEMA  Patient Name: Angel Watkins MRN: [id number] DOB:03/01/1960, 63 y.o., female Today's Date: 08/08/2023  END OF SESSION:   OT End of Session - 08/08/23 0815     Visit Number 3    Number of Visits 36    Date for OT Re-Evaluation 10/10/23    OT Start Time 0800    OT Stop Time 0900    OT Time Calculation (min) 60 min    Activity Tolerance Patient tolerated treatment well;No increased pain    Behavior During Therapy WFL for tasks assessed/performed            Past Medical History:  Diagnosis Date   Anxiety    Arthritis    Back pain    Bilateral swelling of feet    Bulging lumbar disc    and cervical   Diabetes mellitus without complication (HCC)    Fibromyalgia    Hypertension    Joint pain    Lymphedema    Palpitations    Sleep apnea    Spinal stenosis    Wears glasses    Past Surgical History:  Procedure Laterality Date   ABDOMINAL HYSTERECTOMY     ANAL FISSURE REPAIR     ANTERIOR CERVICAL DECOMP/DISCECTOMY FUSION N/A 12/03/2018   Procedure: Cervical five-six  Anterior cervical decompression/discectomy/fusion;  Surgeon: Cheryle Debby LABOR, MD;  Location: [facility] OR;  Service: Neurosurgery;  Laterality: N/A;   BUNIONECTOMY Left 2009   GASTRIC BYPASS     LUMBAR LAMINECTOMY     RADIOLOGY WITH ANESTHESIA N/A 12/14/2017   Procedure: MRI WITH ANESTHESIA, LUMBAR SPINE WITHOUT CONTRAST, CERVICAL SPINE WITHOUT CONTRAST;  Surgeon: Radiologist, Medication, MD;  Location: [facility] OR;  Service: Radiology;  Laterality: N/A;   RADIOLOGY WITH ANESTHESIA N/A 10/08/2019   Procedure: MRI WITH ANESTHESIA    LUMBAR WITHOUT;  Surgeon: Radiologist, Medication, MD;  Location: [facility] OR;  Service: Radiology;  Laterality: N/A;   right shoulder total replacement     Patient Active Problem List   Diagnosis Date Noted   Morbid obesity (HCC) 07/14/2023   Hyperlipidemia associated with type 2 diabetes mellitus  (HCC) 05/29/2023   SOBOE (shortness of breath on exertion) 05/24/2023   Lymphedema 03/08/2023   Anxiety and depression 12/06/2022   Eating disorder 12/02/2020   Vitamin D  deficiency 12/02/2020   Type 2 diabetes mellitus with hyperglycemia, without long-term current use of insulin  (HCC) 07/20/2020   Hypertension associated with diabetes (HCC) 07/20/2020   Class 3 severe obesity with serious comorbidity and body mass index (BMI) of 50.0 to 59.9 in adult 07/20/2020   Cervical radiculopathy 12/03/2018    PCP: Gerard Roys, PA ([facility], [place], [place])  REFERRING PROVIDER: Toribio Kerry, MD  REFERRING DIAG: I89.0  THERAPY DIAG:  Lymphedema, not elsewhere classified  Rationale for Evaluation and Treatment: Rehabilitation  ONSET DATE: 2002 s/p gastric bypass.  SUBJECTIVE:  SUBJECTIVE STATEMENT: Asher returns to OT to continue Lymphedema treatment to BLE. Pt denies LE-related leg pain. Pt reports she tolerated compression wraps about 15 hours after last session. She noticed some swelling reduction and reports that her foot did not swell.   (07/12/23 OT Initial Eval: Angel Watkins is referred to Occupational Therapy by Toribio Kerry, MD at [facility] for evaluation and treatment of BLE/BLQ Lipo-lymphedema.Pt reports she first noticed leg swelling after gastric bypass surgery. She states, I lost 100 pounds, but my legs just got bigger instead of going down. Pt reports lower extremity and lower quadrant ( buttocks, abdomen, hips) worsened continues to worsen over time. She is unable to fit off the shelf compression stockings. Diuretics, prescribed in the past, do not reduce swelling. Pt describes lower extremity/ lower quadrant pain and discomfort as heaviness. She rates  this painful sensation as 6-7/10.  Pt reports her mother had some symptoms of lipedema, but she is unaware of other relatives that may have had lymphedema or lipedema. Pt denies hx of cellulitis and blood clots. Pt reports her mother had the same body type with a small waist and large, fatty hips and legs.    PERTINENT HISTORY:   Anxiety    Arthritis    Back pain    Bilateral swelling of feet    Bulging lumbar disc    and cervical   Diabetes mellitus without complication (HCC)    Fibromyalgia    Hypertension    Joint pain    Palpitations    Sleep apnea    Spinal stenosis   Gastric bypass 2002   Class 3 severe obesity with serious comorbidity and body mass index (BMI) of 50.0 to 59.9 in adult (BMI measured this date 55.6)   Cervical radiculopathy    PAIN:  Are you having pain? No  PRECAUTIONS: Other: LYMPHEDEMA PRECAUTIONS  WEIGHT BEARING RESTRICTIONS: No  FALLS:  Has patient fallen in last 6 months? Yes. Number of falls 1; Just got a new scooter. It caught my shirt and pulled me down. But I wasn't hurt.  LIVING ENVIRONMENT: Lives with: lives with their spouse Lives in: House/apartment Stairs: Yes; External: 3 steps; can reach both Has following equipment at home: Single point cane, shower chair, and hand held shower  OCCUPATION: Advertising account executive full time  LEISURE: enjoys visiting vineyards  HAND DOMINANCE: right   PRIOR LEVEL OF FUNCTION: Independent  PATIENT GOALS: 1. Get swelling under control     2. Limit progression   OBJECTIVE: Note: Objective measures were completed at Evaluation unless otherwise noted.  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 Non cancer related  INFECTIONS: Denies hx of cellulitis  WOUNDS: Denies hx of leg wounds   BLE COMPARATIVE LIMB VOLUMETRICS : Initial 07/26/23  LANDMARK RIGHT  (dominant)  R LEG (A-D) 7303.9 ml  R THIGH (E-G) ml  R FULL LIMB (A-G) ml  Limb Volume differential (LVD)  LVD measures 1.51 % , L>R  Volume change since last measured %  Volume change since initial V  (Blank rows = not tested)  LANDMARK LEFT   L LEG (A-D) 7193.7 ml  L THIGH (E-G) ml  L FULL LIMB (A-G) ml  Limb Volume differential (LVD)  %  Volume change since initial %  Volume change overall %  (Blank rows = not tested)    Moderate-Severe, Stage  II, Bilateral Lower Extremity Lipo- Lymphedema 2/2  Severe, Type III Lipedema involving buttocks, abdomen and ankles  Skin  Description Hyper-Keratosis Peau d' Orange Shiny Tight Fibrotic/ Indurated Fatty Doughy Spongy/ boggy    x   x x x x   Skin dry Flaky WNL Macerated   mild      Color Redness Varicosities Blanching Hemosiderin Stain Mottled    x    x   Odor Malodorous Yeast Fungal infection  WNL      x   Temperature Warm Cool wnl    x     Pitting Edema   1+ 2+ 3+ 4+ Non-pitting         x   Girth Symmetrical Asymmetrical                   Distribution   x  Toes to groin, abdomen, buttocks    Stemmer Sign Positive Negative   +    Lymphorrhea History Of:  Present Absent     x    Wounds History Of Present Absent Venous Arterial Pressure Sheer     x        Signs of Infection Redness Warmth Erythema Acute Swelling Drainage Borders                    Sensation Light Touch Deep pressure Hypersensitivity   Impaired In tact Impaired In tact Absent Impaired    x  x  x    Nails WNL   Fungus nail dystrophy   x     Hair Growth Symmetrical Asymmetrical   x    Skin Creases Base of toes  Ankles   Base of Fingers knees       Abdominal pannus Thigh Lobules  Face/neck    x  x x x     GAIT: Distance walked:  >500' Assistive device utilized: Single point cane Level of assistance: Modified independence Comments: extra time needed  LYMPHEDEMA LIFE IMPACT SCALE (LLIS): 57.35% (The extent to which lymphedema related problems affected your life during the past week.)                                                                                                                          TREATMENT DATE:  Initial BLE limb volumetrics Pt edu for LE self-care R Leg multilayer compression bandaging  PATIENT EDUCATION:  Continued Pt/ CG edu for lymphedema self care home program throughout session. Topics include outcome of comparative limb volumetrics- starting limb volume differentials (LVDs), technology and gradient techniques used for short stretch, multilayer compression wrapping, simple self-MLD, therapeutic lymphatic pumping exercises, skin/nail care, LE precautions, compression garment recommendations and specifications, wear and care schedule and compression garment donning / doffing w assistive devices. Discussed  progress towards all OT goals since commencing CDT. Discussed detrimental impact of obesity on lower and upper extremity lymphedema over time. Reviewed OT goals for lymphedema care with Pt and discussed progress to date.  All questions answered to the Pt's satisfaction. Good return. Person educated: Patient  Education method: Explanation, Demonstration, and Handouts Education comprehension: verbalized understanding, returned demonstration, verbal cues required, and needs further education  Person educated: Patient  Education method: Explanation, Demonstration, and Handouts Education comprehension: verbalized understanding, returned demonstration, verbal cues required, and needs further education  HOME EXERCISE PROGRAM: Lymphedema Self-Care home program  BLE lymphatic pumping there ex using- 1 sets of 10 reps, each exercise in order-  1-2 x daily, bilaterally Simple self MLD 1 x  daily Daily skin care to increase hydration, skin mobility and decrease infection risk- can be done during MLD Intensive Phase CDT: Compression wraps 23/7 until garment fitting complete Self-management Phase CDT: Custom-made gradient compression garments and HOS devices are medically necessary because they are uniquely sized and shaped to fit the exact dimensions of the affected extremities, and to provide appropriate medical grade, graduated compression essential for optimally managing chronic, progressive lymphedema. Multiple custom compression garments are needed to ensure proper hygiene to limit infection risk. Custom compression garments should be replaced q 3-6 months When worn consistently for optimal lipo-lymphedema self-management over time. HOS devices, medically necessary to limit fibrosis buildup in tissue, should be replaced q 2 years and PRN when worn out.     ASSESSMENT:  CLINICAL IMPRESSION: Commenced MLD to LLE/LLQ utilizing short neck sequence, deep diaphragmatic breathing, stationary J strokes to functional inguinal LN, then proximal dynamic  J strokes to thigh, knee, bottleneck region, leg and foot. Sequences were repeated in reverse x distal to proximally x 3 to complete manual therapy. Re applied multilayer, knee length compression wraps omitting foot. Excellent tolerance . Cont as per POC.   (07/12/23 Initial OT eval: Bobetta Korf is a 63 yo female presenting with moderate to sever, stage II, lipo-lymphedema 2/2 to Type III LIPEDEMA, Class III Obesity, and suspected venous insufficiency. Her condition involves buttocks, abdomen and bilateral lower extremities. It is exacerbated by joint inflammation, obstructive sleep apnea and hypertension. This patient has a complex constellation of contributing factors.  Lipedema typically affects women and is characterized by painful, symmetrical, excessive deposition of fat below the waist in the legs, thighs and buttocks resulting in a  disproportionate waist to hip ratio. It may also include the arms. Unlike lymphedema where swelling extends to the feet and presents as asymmetrical, the feet are spared and cuff-like accumulation of thickened, fibrotic, fatty tissue is noted at distal legs. Accompanying class III obesity favors the development of lipo-lymphedema, and/ or obesity-induced lymphedema. As lipedema progresses fatty fibrosis accumulates in the subcutis putting pressure on delicate lymphatics resulting in excessive lymph accumulation and progressive lymphedema. The combined condition of lipo-lymphedema is difficult to treat as lipedema does not typically respond well to Complete Decongestive Therapy (CDT), while lymphedema may respond to varying degrees. Well fitting, comfortable compression garments that do not constrict or roll down often provide pain relief and enable improved functional performance. An advanced, sequential, pneumatic , compression device, The Flexitouch from [employer], may reduce pain and discomfort while helping to reduce lymphatic congestion by incorporating anatomically correct lymphatic channels into the device garments.  Extreme obesity can cause lower extremity lymphedema, termed obesity-induced lymphedema (OIL). Obesity-induced lymphedema is secondary lymphedema that may occur once an individual's body mass index (BMI) exceeds 40. Ms Womble-Miller's  BMI measures 55.6 today. The risk of lymphatic dysfunction increases with elevated BMI and is almost universal once BMI exceeds 60. Patients with obesity-induced lymphedema also may develop areas of massive, localized lymphedema. Individuals with OIL are in an unfavorable cycle of weight gain and lymphatic injury. As BMI increases lymphedema worsens, ambulation becomes more difficult, and BMI further rises. The fundamental treatment for OIL is weight loss.   Ms. Sharren Watkins will benefit from skilled Occupational Therapy to reduce limb swelling and  associated pain, to limit progression and infection risk. BLE lymphedema limits functional performance in all occupational domains, including functional mobility and ambulation,  basic and instrumental ADLs, productive activities, leisure pursuits, social participation and quality of life. This patient will benefit from modified Intensive and Self Management Phase CDT . In addition to MLD, ther ex, skin care and compression bandaging below the knees, Pt will be fitted with custom knee length compression stockings paired with off the shelf , Capri length compression leggings. If insurance benefits allow, she will undergo a trial in the clinic with the advanced Flexitouch device in an effort to reduce discomfort and reduce infection risk. Without skilled OT Li-lymphedema will worsen over time and further functional decline is expected. )   OBJECTIVE IMPAIRMENTS: Abnormal gait, decreased balance, decreased knowledge of condition, decreased knowledge of use of DME, decreased mobility, difficulty walking, decreased ROM, decreased strength, increased edema, obesity, pain, and lymphedema -related pain and chronic limb swelling.   ACTIVITY LIMITATIONS: Mobility and functional ambulation limitations:  abnormal gait pattern, carrying, lifting, bending, sitting, standing, squatting, stairs, transfers, and bed mobility Basic and instrumental ADLs (reaching feet and distal legs to groom nails, inspect skin, apply lotion, bathe lower body, difficulty with LB dressing, fitting shoes and socks, impaired sleeping, meal prep, standing to cook, driving, shopping, yard work, house work Paediatric nurse activities: work related activities requiring extended standing, walking, sitting; caring for others Social participation in the community, socializing with others, LE affects my body image completely, interferes w intimacy Leisure pursuits requiring extended standing, walking, sitting  PERSONAL FACTORS: Age, Past/current  experiences, Time since onset of injury/illness/exacerbation, and 3+ comorbidities: lipedema, class III Obesity, OSA, arthritis are also affecting patient's functional outcome.   REHAB POTENTIAL:Pt has difficulty reaching feet, so requires assistance applying and removing multi-layer compression bandaging at home daily between OT visits to achieve  optimal limb volume reduction and functional outcomes. Without consistent, daily caregiver assistance with compression wrapping throughout CDT prognosis is poor. With daily assistance with wrapping, prognosis is fair due to BMI  and Lipedema co-morbidities   EVALUATION COMPLEXITY: Complex  GOALS: Goals reviewed with patient? Yes  SHORT TERM GOALS: Target date: 4th OT Rx visit   Pt will demonstrate understanding of lymphedema precautions and prevention strategies with modified independence using a printed reference to identify at least 5 precautions and discussing how s/he may implement them into daily life to reduce risk of progression with extra time. Baseline:Max A Goal status: INITIAL  2.  Pt will be able to apply multilayer, knee length, gradient, compression wraps to one leg at a time from toes to below knee with Max caregiver A x 1 to decrease limb volume, to limit infection risk, and to limit lymphedema progression.  Baseline: Dependent Goal status: INITIAL  LONG TERM GOALS: Target date: 08/08/23 1.Given this patient's Intake score of 57.35 % on the Lymphedema Life Impact Scale (LLIS), patient will experience a reduction of at least 10 points in her perceived level of functional impairment resulting from  lymphedema to improve functional performance and quality of life (QOL). Baseline: 57.35 % Goal status: INITIAL  2.  Pt will achieve at least a 10% volume reduction in B legs to return limb to typical size and shape, to limit infection risk and LE progression, to decrease pain, to improve function. Baseline: Dependent Goal status:  INITIAL  3.  Pt will obtain appropriate compression garments/devices and achieve modified independence (extra time + assistive devices) with donning/doffing to optimize limb volume reductions and limit LE progression over time. Baseline: Dependent Goal status: INITIAL  During Intensive phase CDT, with modified independence, Pt will achieve at least 85% compliance with all lymphedema self-care home program components, including daily skin care, compression wraps and /or garments, simple self MLD and lymphatic pumping therex to habituate LE self care protocol  into ADLs for optimal LE self-management over time. Baseline: Dependent Goal status: INITIAL PLAN:  OT FREQUENCY: 2x/week and PRN  PT DURATION: other: 18 weeks and PRN  PLANNED INTERVENTIONS: Complete Decongestive Therapy (CDT): Manual Lymphatic Drainage (MLD) , Skin care, ther ex, gradient [id number]-Therapeutic exercises, 97530- Therapeutic activity, 97535- Self Care, [id number]- Manual therapy, Patient/Family education, Manual lymph drainage, Compression bandaging, DME instructions, and trial with advanced, sequential, pneu,matic compression device ([employer] Flexitouch) Custom-made gradient compression garments and HOS devices are medically necessary because they are uniquely sized and shaped to fit the exact dimensions of the affected extremities, and to provide appropriate medical grade, graduated compression essential for optimally managing chronic, progressive lymphedema. Multiple custom compression garments are needed to ensure proper hygiene to limit infection risk. Custom compression garments should be replaced q 3-6 months When worn consistently for optimal lipo-lymphedema self-management over time. HOS devices, medically necessary to limit fibrosis buildup in tissue, should be replaced q 2 years and PRN when worn out.   PLAN FOR NEXT SESSION:  Pt/ caregiver edu BLE comparative limb volumetrics  Knee length, Multilayer  compression wraps to one leg only-Pt's choice.   Zebedee Dec, MS, OTR/L, CLT-LANA 08/08/23 8:16 AM

## 2023-08-09 ENCOUNTER — Ambulatory Visit: Admitting: Occupational Therapy

## 2023-08-09 DIAGNOSIS — I890 Lymphedema, not elsewhere classified: Secondary | ICD-10-CM

## 2023-08-09 NOTE — Therapy (Signed)
 OUTPATIENT OCCUPATIONAL THERAPY TREATMENT  BILATERAL LOWER EXTREMITY/ BILATERAL LOWER QUADRANT LIPO-LYMPHEDEMA  Patient Name: Angel Watkins MRN: [id number] DOB:[birth year], 63 y.o., female Today's Date: 08/09/2023  END OF SESSION:   OT End of Session - 08/09/23 0807     Visit Number 4    Number of Visits 36    Date for OT Re-Evaluation 10/10/23    OT Start Time 0800    OT Stop Time 0906    OT Time Calculation (min) 66 min    Activity Tolerance Patient tolerated treatment well;No increased pain    Behavior During Therapy WFL for tasks assessed/performed            Past Medical History:  Diagnosis Date   Anxiety    Arthritis    Back pain    Bilateral swelling of feet    Bulging lumbar disc    and cervical   Diabetes mellitus without complication (HCC)    Fibromyalgia    Hypertension    Joint pain    Lymphedema    Palpitations    Sleep apnea    Spinal stenosis    Wears glasses    Past Surgical History:  Procedure Laterality Date   ABDOMINAL HYSTERECTOMY     ANAL FISSURE REPAIR     ANTERIOR CERVICAL DECOMP/DISCECTOMY FUSION N/A 12/03/2018   Procedure: Cervical five-six  Anterior cervical decompression/discectomy/fusion;  Surgeon: Cheryle Debby LABOR, MD;  Location: [facility] OR;  Service: Neurosurgery;  Laterality: N/A;   BUNIONECTOMY Left 2009   GASTRIC BYPASS     LUMBAR LAMINECTOMY     RADIOLOGY WITH ANESTHESIA N/A 12/14/2017   Procedure: MRI WITH ANESTHESIA, LUMBAR SPINE WITHOUT CONTRAST, CERVICAL SPINE WITHOUT CONTRAST;  Surgeon: Radiologist, Medication, MD;  Location: [facility] OR;  Service: Radiology;  Laterality: N/A;   RADIOLOGY WITH ANESTHESIA N/A 10/08/2019   Procedure: MRI WITH ANESTHESIA    LUMBAR WITHOUT;  Surgeon: Radiologist, Medication, MD;  Location: [facility] OR;  Service: Radiology;  Laterality: N/A;   right shoulder total replacement     Patient Active Problem List   Diagnosis Date Noted   Morbid obesity (HCC) 07/14/2023   Hyperlipidemia associated with  type 2 diabetes mellitus (HCC) 05/29/2023   SOBOE (shortness of breath on exertion) 05/24/2023   Lymphedema 03/08/2023   Anxiety and depression 12/06/2022   Eating disorder 12/02/2020   Vitamin D  deficiency 12/02/2020   Type 2 diabetes mellitus with hyperglycemia, without long-term current use of insulin  (HCC) 07/20/2020   Hypertension associated with diabetes (HCC) 07/20/2020   Class 3 severe obesity with serious comorbidity and body mass index (BMI) of 50.0 to 59.9 in adult 07/20/2020   Cervical radiculopathy 12/03/2018    PCP: Gerard Roys, PA ([facility], [place], [place])  REFERRING PROVIDER: Toribio Kerry, MD  REFERRING DIAG: I89.0  THERAPY DIAG:  Lymphedema, not elsewhere classified  Rationale for Evaluation and Treatment: Rehabilitation  ONSET DATE: 2002 s/p gastric bypass.  SUBJECTIVE:  SUBJECTIVE STATEMENT: Shirlette returns to OT to continue Lymphedema treatment to BLE. Pt is accompanied by her supportive spouse, Angel Watkins. Angel Watkins is her to learn how to apply compression wraps. Pt denies LE-related leg pain. Pt reports she tolerated compression wraps the full 24 hours after last session. She noticed some swelling reduction and reports that her foot did not swell.   (07/12/23 OT Initial Eval: Angel Watkins is referred to Occupational Therapy by Toribio Kerry, MD at [facility] for evaluation and treatment of BLE/BLQ Lipo-lymphedema.Pt reports she first noticed leg swelling after gastric bypass surgery. She states, I lost 100 pounds, but my legs just got bigger instead of going down. Pt reports lower extremity and lower quadrant ( buttocks, abdomen, hips) worsened continues to worsen over time. She is unable to fit off the shelf compression stockings. Diuretics, prescribed in the past, do not reduce  swelling. Pt describes lower extremity/ lower quadrant pain and discomfort as heaviness. She rates  this painful sensation as 6-7/10. Pt reports her mother had some symptoms of lipedema, but she is unaware of other relatives that may have had lymphedema or lipedema. Pt denies hx of cellulitis and blood clots. Pt reports her mother had the same body type with a small waist and large, fatty hips and legs.    PERTINENT HISTORY:   Anxiety    Arthritis    Back pain    Bilateral swelling of feet    Bulging lumbar disc    and cervical   Diabetes mellitus without complication (HCC)    Fibromyalgia    Hypertension    Joint pain    Palpitations    Sleep apnea    Spinal stenosis   Gastric bypass 2002   Class 3 severe obesity with serious comorbidity and body mass index (BMI) of 50.0 to 59.9 in adult (BMI measured this date 55.6)   Cervical radiculopathy    PAIN:  Are you having pain? No  PRECAUTIONS: Other: LYMPHEDEMA PRECAUTIONS  WEIGHT BEARING RESTRICTIONS: No  FALLS:  Has patient fallen in last 6 months? Yes. Number of falls 1; Just got a new scooter. It caught my shirt and pulled me down. But I wasn't hurt.  LIVING ENVIRONMENT: Lives with: lives with their spouse Lives in: House/apartment Stairs: Yes; External: 3 steps; can reach both Has following equipment at home: Single point cane, shower chair, and hand held shower  OCCUPATION: Advertising account executive full time  LEISURE: enjoys visiting vineyards  HAND DOMINANCE: right   PRIOR LEVEL OF FUNCTION: Independent  PATIENT GOALS: 1. Get swelling under control     2. Limit progression   OBJECTIVE: Note: Objective measures were completed at Evaluation unless otherwise noted.  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 Non cancer related  INFECTIONS: Denies hx of cellulitis  WOUNDS: Denies hx of leg wounds   BLE COMPARATIVE LIMB VOLUMETRICS : Initial 07/26/23  LANDMARK RIGHT  (dominant)  R LEG (A-D) 7303.9 ml  R THIGH (E-G) ml  R FULL LIMB (A-G) ml  Limb Volume differential (LVD)  LVD measures 1.51 % , L>R  Volume change since last measured %  Volume change since initial V  (Blank rows =  not tested)  LANDMARK LEFT   L LEG (A-D) 7193.7 ml  L THIGH (E-G) ml  L FULL LIMB (A-G) ml  Limb Volume differential (LVD)  %  Volume change since initial %  Volume change overall %  (Blank rows = not tested)    Moderate-Severe, Stage  II, Bilateral Lower Extremity Lipo- Lymphedema 2/2  Severe, Type III Lipedema involving buttocks, abdomen and ankles  Skin  Description Hyper-Keratosis Peau d' Orange Shiny Tight Fibrotic/ Indurated Fatty Doughy Spongy/ boggy    x   x x x x   Skin dry Flaky WNL Macerated   mild      Color Redness Varicosities Blanching Hemosiderin Stain Mottled    x    x   Odor Malodorous Yeast Fungal infection  WNL      x   Temperature Warm Cool wnl    x     Pitting Edema   1+ 2+ 3+ 4+ Non-pitting         x   Girth Symmetrical Asymmetrical                   Distribution   x  Toes to groin, abdomen, buttocks    Stemmer Sign Positive Negative   +    Lymphorrhea History Of:  Present Absent     x    Wounds History Of Present Absent Venous Arterial Pressure Sheer     x        Signs of Infection Redness Warmth Erythema Acute Swelling Drainage Borders                    Sensation Light Touch Deep pressure Hypersensitivity   Impaired In tact Impaired In tact Absent Impaired    x  x  x    Nails WNL   Fungus nail dystrophy   x     Hair Growth Symmetrical Asymmetrical   x    Skin Creases Base of  toes  Ankles   Base of Fingers knees       Abdominal pannus Thigh Lobules  Face/neck    x  x x x     GAIT: Distance walked: >500' Assistive device utilized: Single point cane Level of assistance: Modified independence Comments: extra time needed  LYMPHEDEMA LIFE IMPACT SCALE (LLIS): 57.35% (The extent to which lymphedema related problems affected your life during the past week.)                                                                                                                          TREATMENT DATE:  Pt/family edu for LE self-care R Leg multilayer compression bandaging  PATIENT EDUCATION:  Continued Pt/ CG edu for lymphedema self care home program throughout session. Topics include outcome of comparative limb volumetrics- starting limb volume differentials (LVDs), technology and gradient techniques used for short stretch, multilayer compression wrapping, simple self-MLD, therapeutic lymphatic pumping exercises, skin/nail care, LE precautions, compression garment recommendations and specifications,  wear and care schedule and compression garment donning / doffing w assistive devices. Discussed progress towards all OT goals since commencing CDT. Discussed detrimental impact of obesity on lower and upper extremity lymphedema over time. Reviewed OT goals for lymphedema care with Pt and discussed progress to date.  All questions answered to the Pt's satisfaction. Good return. Person educated: Patient and spouse Education method: Explanation, Demonstration, and Handouts Education comprehension: verbalized understanding, returned demonstration, verbal cues required, and needs further education   LYMPHEDEMA SELF-CARE HOME PROGRAM:  BLE lymphatic pumping there ex using- 1 sets of 10 reps, each exercise in order-  1-2 x daily, bilaterally Simple self MLD 1 x daily  MLD utilizing short neck sequence, deep diaphragmatic breathing, stationary J strokes to functional inguinal LN, then  proximal dynamic  J strokes to thigh, knee, bottleneck region, leg and foot. Sequences were repeated in reverse x distal to proximally x 3 to complete manual therapy. Daily skin care to increase hydration, skin mobility and decrease infection risk- can be done during MLD Intensive Phase CDT: Compression wraps 23/7 until garment fitting complete Self-management Phase CDT: Custom-made gradient compression garments and HOS devices are medically necessary because they are uniquely sized and shaped to fit the exact dimensions of the affected extremities, and to provide appropriate medical grade, graduated compression essential for optimally managing chronic, progressive lymphedema. Multiple custom compression garments are needed to ensure proper hygiene to limit infection risk. Custom compression garments should be replaced q 3-6 months When worn consistently for optimal lipo-lymphedema self-management over time. HOS devices, medically necessary to limit fibrosis buildup in tissue, should be replaced q 2 years and PRN when worn out.     ASSESSMENT:  CLINICAL IMPRESSION:   OT session dedicated to teaching spouse to assist Pt with multilayer gradient compression wrapping. OT demonstrated techniques and spouse had an opportunity to practice.He required max assist to apply gradient wraps. Pt spontaneously provided feedback, guidance and corrections. Pt and spouse made a cell phone video for reference. Productive session. Cont as per POC.   (07/12/23 Initial OT eval: Jenesys Casseus is a 63 yo female presenting with moderate to sever, stage II, lipo-lymphedema 2/2 to Type III LIPEDEMA, Class III Obesity, and suspected venous insufficiency. Her condition involves buttocks, abdomen and bilateral lower extremities. It is exacerbated by joint inflammation, obstructive sleep apnea and hypertension. This patient has a complex constellation of contributing factors.  Lipedema typically affects women and is  characterized by painful, symmetrical, excessive deposition of fat below the waist in the legs, thighs and buttocks resulting in a disproportionate waist to hip ratio. It may also include the arms. Unlike lymphedema where swelling extends to the feet and presents as asymmetrical, the feet are spared and cuff-like accumulation of thickened, fibrotic, fatty tissue is noted at distal legs. Accompanying class III obesity favors the development of lipo-lymphedema, and/ or obesity-induced lymphedema. As lipedema progresses fatty fibrosis accumulates in the subcutis putting pressure on delicate lymphatics resulting in excessive lymph accumulation and progressive lymphedema. The combined condition of lipo-lymphedema is difficult to treat as lipedema does not typically respond well to Complete Decongestive Therapy (CDT), while lymphedema may respond to varying degrees. Well fitting, comfortable compression garments that do not constrict or roll down often provide pain relief and enable improved functional performance. An advanced, sequential, pneumatic , compression device, The Flexitouch from [employer], may reduce pain and discomfort while helping to reduce lymphatic congestion by incorporating anatomically correct lymphatic channels into the device garments.  Extreme obesity can  cause lower extremity lymphedema, termed obesity-induced lymphedema (OIL). Obesity-induced lymphedema is secondary lymphedema that may occur once an individual's body mass index (BMI) exceeds 40. Ms Womble-Miller's BMI measures 55.6 today. The risk of lymphatic dysfunction increases with elevated BMI and is almost universal once BMI exceeds 60. Patients with obesity-induced lymphedema also may develop areas of massive, localized lymphedema. Individuals with OIL are in an unfavorable cycle of weight gain and lymphatic injury. As BMI increases lymphedema worsens, ambulation becomes more difficult, and BMI further rises. The fundamental  treatment for OIL is weight loss.   Ms. Sharren Watkins will benefit from skilled Occupational Therapy to reduce limb swelling and associated pain, to limit progression and infection risk. BLE lymphedema limits functional performance in all occupational domains, including functional mobility and ambulation,  basic and instrumental ADLs, productive activities, leisure pursuits, social participation and quality of life. This patient will benefit from modified Intensive and Self Management Phase CDT . In addition to MLD, ther ex, skin care and compression bandaging below the knees, Pt will be fitted with custom knee length compression stockings paired with off the shelf , Capri length compression leggings. If insurance benefits allow, she will undergo a trial in the clinic with the advanced Flexitouch device in an effort to reduce discomfort and reduce infection risk. Without skilled OT Li-lymphedema will worsen over time and further functional decline is expected. )   OBJECTIVE IMPAIRMENTS: Abnormal gait, decreased balance, decreased knowledge of condition, decreased knowledge of use of DME, decreased mobility, difficulty walking, decreased ROM, decreased strength, increased edema, obesity, pain, and lymphedema -related pain and chronic limb swelling.   ACTIVITY LIMITATIONS: Mobility and functional ambulation limitations:  abnormal gait pattern, carrying, lifting, bending, sitting, standing, squatting, stairs, transfers, and bed mobility Basic and instrumental ADLs (reaching feet and distal legs to groom nails, inspect skin, apply lotion, bathe lower body, difficulty with LB dressing, fitting shoes and socks, impaired sleeping, meal prep, standing to cook, driving, shopping, yard work, house work Paediatric nurse activities: work related activities requiring extended standing, walking, sitting; caring for others Social participation in the community, socializing with others, LE affects my body image completely,  interferes w intimacy Leisure pursuits requiring extended standing, walking, sitting  PERSONAL FACTORS: Age, Past/current experiences, Time since onset of injury/illness/exacerbation, and 3+ comorbidities: lipedema, class III Obesity, OSA, arthritis are also affecting patient's functional outcome.   REHAB POTENTIAL:Pt has difficulty reaching feet, so requires assistance applying and removing multi-layer compression bandaging at home daily between OT visits to achieve  optimal limb volume reduction and functional outcomes. Without consistent, daily caregiver assistance with compression wrapping throughout CDT prognosis is poor. With daily assistance with wrapping, prognosis is fair due to BMI  and Lipedema co-morbidities   EVALUATION COMPLEXITY: Complex  GOALS: Goals reviewed with patient? Yes  SHORT TERM GOALS: Target date: 4th OT Rx visit   Pt will demonstrate understanding of lymphedema precautions and prevention strategies with modified independence using a printed reference to identify at least 5 precautions and discussing how s/he may implement them into daily life to reduce risk of progression with extra time. Baseline:Max A Goal status: INITIAL  2.  Pt will be able to apply multilayer, knee length, gradient, compression wraps to one leg at a time from toes to below knee with Max caregiver A x 1 to decrease limb volume, to limit infection risk, and to limit lymphedema progression.  Baseline: Dependent Goal status: INITIAL  LONG TERM GOALS: Target date: 08/08/23 1.Given this patient's Intake score of 57.35 %  on the Lymphedema Life Impact Scale (LLIS), patient will experience a reduction of at least 10 points in her perceived level of functional impairment resulting from lymphedema to improve functional performance and quality of life (QOL). Baseline: 57.35 % Goal status: INITIAL  2.  Pt will achieve at least a 10% volume reduction in B legs to return limb to typical size and shape, to  limit infection risk and LE progression, to decrease pain, to improve function. Baseline: Dependent Goal status: INITIAL  3.  Pt will obtain appropriate compression garments/devices and achieve modified independence (extra time + assistive devices) with donning/doffing to optimize limb volume reductions and limit LE progression over time. Baseline: Dependent Goal status: INITIAL  During Intensive phase CDT, with modified independence, Pt will achieve at least 85% compliance with all lymphedema self-care home program components, including daily skin care, compression wraps and /or garments, simple self MLD and lymphatic pumping therex to habituate LE self care protocol  into ADLs for optimal LE self-management over time. Baseline: Dependent Goal status: INITIAL PLAN:  OT FREQUENCY: 2x/week and PRN  OT DURATION: other: 18 weeks and PRN  PLANNED INTERVENTIONS: Complete Decongestive Therapy (CDT): Manual Lymphatic Drainage (MLD) , Skin care, ther ex, gradient [id number]-Therapeutic exercises, 97530- Therapeutic activity, 97535- Self Care, [id number]- Manual therapy, Patient/Family education, Manual lymph drainage, Compression bandaging, DME instructions, and trial with advanced, sequential, pneumatic, compression device ([employer] Flexitouch) Custom-made gradient compression garments and HOS devices are medically necessary because they are uniquely sized and shaped to fit the exact dimensions of the affected extremities, and to provide appropriate medical grade, graduated compression essential for optimally managing chronic, progressive lymphedema. Multiple custom compression garments are needed to ensure proper hygiene to limit infection risk. Custom compression garments should be replaced q 3-6 months When worn consistently for optimal lipo-lymphedema self-management over time. HOS devices, medically necessary to limit fibrosis buildup in tissue, should be replaced q 2 years and PRN when worn  out.   PLAN FOR NEXT SESSION:  Commence LLE/LLQ MLD Knee length, Multilayer compression wraps to L LEG (omit foot)    Zebedee Dec, MS, OTR/L, CLT-LANA 08/09/23 9:11 AM

## 2023-08-15 ENCOUNTER — Ambulatory Visit (INDEPENDENT_AMBULATORY_CARE_PROVIDER_SITE_OTHER): Admitting: Family Medicine

## 2023-08-15 ENCOUNTER — Encounter (INDEPENDENT_AMBULATORY_CARE_PROVIDER_SITE_OTHER): Payer: Self-pay | Admitting: Family Medicine

## 2023-08-15 DIAGNOSIS — Z6841 Body Mass Index (BMI) 40.0 and over, adult: Secondary | ICD-10-CM | POA: Diagnosis not present

## 2023-08-15 DIAGNOSIS — E1165 Type 2 diabetes mellitus with hyperglycemia: Secondary | ICD-10-CM

## 2023-08-15 DIAGNOSIS — Z7985 Long-term (current) use of injectable non-insulin antidiabetic drugs: Secondary | ICD-10-CM | POA: Diagnosis not present

## 2023-08-15 LAB — HM DIABETES EYE EXAM

## 2023-08-15 MED ORDER — SEMAGLUTIDE (1 MG/DOSE) 4 MG/3ML ~~LOC~~ SOPN: 1.0000 mg | PEN_INJECTOR | SUBCUTANEOUS | 0 refills | Status: DC

## 2023-08-15 NOTE — Progress Notes (Unsigned)
 SUBJECTIVE:  Chief Complaint: Obesity  Interim History: patient has been focused on lymphedema treatment of her left leg.  She is noticing a difference in her leg after beginning treatment. She has been trying to eat a bit more.  She is trying to make more mindful choices.  Her husband is cooking more but he just recently got a pacemaker so he has been limited.  She feels she is doing better with choices.  She has been eating more fruit, particularly cantaloupe. She has incorporated soups into her intake. Has been trying to work on awareness in social setting on intake.  She has a family event in August to celebrate her anniversary.   Angel Watkins is here to discuss her progress with her obesity treatment plan. She is on the Category 2 Plan and states she is following her eating plan approximately 70 % of the time. She states she is exercising 30 minutes 3 times per week.   OBJECTIVE: Visit Diagnoses: Problem List Items Addressed This Visit       Endocrine   Type 2 diabetes mellitus with hyperglycemia, without long-term current use of insulin  (HCC)   Patient working being more mindful of simple carbohydrate intake and focusing on increasing protein.  She is on ozempic  1mg  and is having good control of intake.  Denies needing an increase at this time.  No change in dose but refill sent to pharmacy.      Relevant Medications   Semaglutide , 1 MG/DOSE, 4 MG/3ML SOPN     Other   Morbid obesity (HCC) - Primary   Relevant Medications   Semaglutide , 1 MG/DOSE, 4 MG/3ML SOPN   Other Visit Diagnoses       BMI 50.0-59.9, adult (HCC)       Relevant Medications   Semaglutide , 1 MG/DOSE, 4 MG/3ML SOPN       Vitals Temp: 98.1 F (36.7 C) BP: 97/64 Pulse Rate: 90 SpO2: 99 %   Anthropometric Measurements Height: 5' 4 (1.626 m) Weight: (!) 337 lb (152.9 kg) BMI (Calculated): 57.82 Weight at Last Visit: 325 lb Weight Lost Since Last Visit: 0 Weight Gained Since Last Visit: 12 Starting  Weight: 333 lb Total Weight Loss (lbs): 0 lb (0 kg)   No data recorded Other Clinical Data Fasting: no Labs: no Today's Visit #: 44 Starting Date: 04/07/20 Comments: Cat 2     ASSESSMENT AND PLAN:  Diet: Zya is currently in the action stage of change. As such, her goal is to continue with weight loss efforts and has agreed to the Category 2 Plan.   Exercise:  For substantial health benefits, adults should do at least 150 minutes (2 hours and 30 minutes) a week of moderate-intensity, or 75 minutes (1 hour and 15 minutes) a week of vigorous-intensity aerobic physical activity, or an equivalent combination of moderate- and vigorous-intensity aerobic activity. Aerobic activity should be performed in episodes of at least 10 minutes, and preferably, it should be spread throughout the week.  Behavior Modification:  We discussed the following Behavioral Modification Strategies today: increasing lean protein intake, decreasing simple carbohydrates, no skipping meals, meal planning and cooking strategies, and emotional eating strategies .   Return in about 5 weeks (around 09/19/2023).   She was informed of the importance of frequent follow up visits to maximize her success with intensive lifestyle modifications for her multiple health conditions.  Attestation Statements:   Reviewed by clinician on day of visit: allergies, medications, problem list, medical history, surgical history, family history,  social history, and previous encounter notes.     Adelita Cho, MD

## 2023-08-15 NOTE — Assessment & Plan Note (Signed)
 Patient working being more mindful of simple carbohydrate intake and focusing on increasing protein.  She is on ozempic  1mg  and is having good control of intake.  Denies needing an increase at this time.  No change in dose but refill sent to pharmacy.

## 2023-08-16 ENCOUNTER — Ambulatory Visit: Admitting: Occupational Therapy

## 2023-08-16 ENCOUNTER — Encounter: Payer: Self-pay | Admitting: Occupational Therapy

## 2023-08-16 DIAGNOSIS — I890 Lymphedema, not elsewhere classified: Secondary | ICD-10-CM

## 2023-08-16 NOTE — Therapy (Signed)
 OUTPATIENT OCCUPATIONAL THERAPY TREATMENT  BILATERAL LOWER EXTREMITY/ BILATERAL LOWER QUADRANT LIPO-LYMPHEDEMA  Patient Name: Angel Watkins MRN: [id number] DOB:1960/06/08, 63 y.o., female Today's Date: 08/16/2023  END OF SESSION:   OT End of Session - 08/16/23 0802     Visit Number 5    Number of Visits 36    Date for OT Re-Evaluation 10/10/23    OT Start Time 0800    OT Stop Time 0900    OT Time Calculation (min) 60 min    Activity Tolerance Patient tolerated treatment well;No increased pain    Behavior During Therapy WFL for tasks assessed/performed            Past Medical History:  Diagnosis Date   Anxiety    Arthritis    Back pain    Bilateral swelling of feet    Bulging lumbar disc    and cervical   Diabetes mellitus without complication (HCC)    Fibromyalgia    Hypertension    Joint pain    Lymphedema    Palpitations    Sleep apnea    Spinal stenosis    Wears glasses    Past Surgical History:  Procedure Laterality Date   ABDOMINAL HYSTERECTOMY     ANAL FISSURE REPAIR     ANTERIOR CERVICAL DECOMP/DISCECTOMY FUSION N/A 12/03/2018   Procedure: Cervical five-six  Anterior cervical decompression/discectomy/fusion;  Surgeon: Angel Debby LABOR, MD;  Location: [facility] OR;  Service: Neurosurgery;  Laterality: N/A;   BUNIONECTOMY Left 2009   GASTRIC BYPASS     LUMBAR LAMINECTOMY     RADIOLOGY WITH ANESTHESIA N/A 12/14/2017   Procedure: MRI WITH ANESTHESIA, LUMBAR SPINE WITHOUT CONTRAST, CERVICAL SPINE WITHOUT CONTRAST;  Surgeon: Radiologist, Medication, MD;  Location: [facility] OR;  Service: Radiology;  Laterality: N/A;   RADIOLOGY WITH ANESTHESIA N/A 10/08/2019   Procedure: MRI WITH ANESTHESIA    LUMBAR WITHOUT;  Surgeon: Radiologist, Medication, MD;  Location: [facility] OR;  Service: Radiology;  Laterality: N/A;   right shoulder total replacement     Patient Active Problem List   Diagnosis Date Noted   Morbid obesity (HCC) 07/14/2023   Hyperlipidemia associated with  type 2 diabetes mellitus (HCC) 05/29/2023   SOBOE (shortness of breath on exertion) 05/24/2023   Lymphedema 03/08/2023   Anxiety and depression 12/06/2022   Eating disorder 12/02/2020   Vitamin D  deficiency 12/02/2020   Type 2 diabetes mellitus with hyperglycemia, without long-term current use of insulin  (HCC) 07/20/2020   Hypertension associated with diabetes (HCC) 07/20/2020   Class 3 severe obesity with serious comorbidity and body mass index (BMI) of 50.0 to 59.9 in adult 07/20/2020   Cervical radiculopathy 12/03/2018    PCP: Angel Watkins ([facility], [place], [place])  REFERRING PROVIDER: Toribio Kerry, MD  REFERRING DIAG: I89.0  THERAPY DIAG:  Lymphedema, not elsewhere classified  Rationale for Evaluation and Treatment: Rehabilitation  ONSET DATE: 2002 s/p gastric bypass.  SUBJECTIVE:  SUBJECTIVE STATEMENT: Angel Watkins returns to OT to continue Lymphedema treatment to BLE. Pt is unaccompanied today. Pt reports her husband, , [name], is wrapping her and it's helping, but he still doesn't get the wraps quite high enough. Pt denies LE-related leg pain. Pt reports she tolerates compression wraps without difficulty. She noticed some swelling reduction and reports that her foot does not swell without wraps.  (07/12/23 OT Initial Eval: Angel Watkins is referred to Occupational Therapy by Angel Kerry, MD at [facility] for evaluation and treatment of BLE/BLQ Lipo-lymphedema.Pt reports she first noticed leg swelling after gastric bypass surgery. She states, I lost 100 pounds, but my legs just got bigger instead of going down. Pt reports lower extremity and lower quadrant ( buttocks, abdomen, hips) worsened continues to worsen over time. She is unable to fit off the shelf compression stockings. Diuretics,  prescribed in the past, do not reduce swelling. Pt describes lower extremity/ lower quadrant pain and discomfort as heaviness. She rates  this painful sensation as 6-7/10. Pt reports her mother had some symptoms of lipedema, but she is unaware of other relatives that may have had lymphedema or lipedema. Pt denies hx of cellulitis and blood clots. Pt reports her mother had the same body type with a small waist and large, fatty hips and legs.    PERTINENT HISTORY:   Anxiety    Arthritis    Back pain    Bilateral swelling of feet    Bulging lumbar disc    and cervical   Diabetes mellitus without complication (HCC)    Fibromyalgia    Hypertension    Joint pain    Palpitations    Sleep apnea    Spinal stenosis   Gastric bypass 2002   Class 3 severe obesity with serious comorbidity and body mass index (BMI) of 50.0 to 59.9 in adult (BMI measured this date 55.6)   Cervical radiculopathy    PAIN:  Are you having pain? No  PRECAUTIONS: Other: LYMPHEDEMA PRECAUTIONS  WEIGHT BEARING RESTRICTIONS: No  FALLS:  Has patient fallen in last 6 months? Yes. Number of falls 1; Just got a new scooter. It caught my shirt and pulled me down. But I wasn't hurt.  LIVING ENVIRONMENT: Lives with: lives with their spouse Lives in: House/apartment Stairs: Yes; External: 3 steps; can reach both Has following equipment at home: Single point cane, shower chair, and hand held shower  OCCUPATION: Advertising account executive full time  LEISURE: enjoys visiting vineyards  HAND DOMINANCE: right   PRIOR LEVEL OF FUNCTION: Independent  PATIENT GOALS: 1. Get swelling under control     2. Limit progression   OBJECTIVE: Note: Objective measures were completed at Evaluation unless otherwise noted.  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 Non cancer related  INFECTIONS: Denies hx of cellulitis  WOUNDS: Denies hx of leg wounds   BLE COMPARATIVE LIMB VOLUMETRICS : Initial 07/26/23  LANDMARK RIGHT  (dominant)  R LEG (A-D) 7303.9 ml  R THIGH (E-G) ml  R FULL LIMB (A-G) ml  Limb Volume differential (LVD)  LVD measures 1.51 % , L>R  Volume change since last measured %  Volume change since initial  V  (Blank rows = not tested)  LANDMARK LEFT   L LEG (A-D) 7193.7 ml  L THIGH (E-G) ml  L FULL LIMB (A-G) ml  Limb Volume differential (LVD)  %  Volume change since initial %  Volume change overall %  (Blank rows = not tested)    Moderate-Severe, Stage  II, Bilateral Lower Extremity Lipo- Lymphedema 2/2  Severe, Type III Lipedema involving buttocks, abdomen and ankles  Skin  Description Hyper-Keratosis Peau d' Orange Shiny Tight Fibrotic/ Indurated Fatty Doughy Spongy/ boggy    x   x x x x   Skin dry Flaky WNL Macerated   mild      Color Redness Varicosities Blanching Hemosiderin Stain Mottled    x    x   Odor Malodorous Yeast Fungal infection  WNL      x   Temperature Warm Cool wnl    x     Pitting Edema   1+ 2+ 3+ 4+ Non-pitting         x   Girth Symmetrical Asymmetrical                   Distribution   x  Toes to groin, abdomen, buttocks    Stemmer Sign Positive Negative   +    Lymphorrhea History Of:  Present Absent     x    Wounds History Of Present Absent Venous Arterial Pressure Sheer     x        Signs of Infection Redness Warmth Erythema Acute Swelling Drainage Borders                    Sensation Light Touch Deep pressure Hypersensitivity   Impaired In tact Impaired In tact Absent Impaired    x  x  x    Nails WNL   Fungus nail dystrophy   x     Hair Growth Symmetrical  Asymmetrical   x    Skin Creases Base of toes  Ankles   Base of Fingers knees       Abdominal pannus Thigh Lobules  Face/neck    x  x x x     GAIT: Distance walked: >500' Assistive device utilized: Single point cane Level of assistance: Modified independence Comments: extra time needed  LYMPHEDEMA LIFE IMPACT SCALE (LLIS): 57.35% (The extent to which lymphedema related problems affected your life during the past week.)                                                                                                                          TREATMENT DATE:  Pt/family edu for LE self-care R Leg multilayer compression bandaging  PATIENT EDUCATION:  Continued Pt/ CG edu for lymphedema self care home program throughout session. Topics include outcome of comparative limb volumetrics- starting limb volume differentials (LVDs), technology and gradient techniques used for short stretch, multilayer compression wrapping, simple self-MLD, therapeutic lymphatic pumping exercises, skin/nail care, LE precautions,  compression garment recommendations and specifications, wear and care schedule and compression garment donning / doffing w assistive devices. Discussed progress towards all OT goals since commencing CDT. Discussed detrimental impact of obesity on lower and upper extremity lymphedema over time. Reviewed OT goals for lymphedema care with Pt and discussed progress to date.  All questions answered to the Pt's satisfaction. Good return. Person educated: Patient and spouse Education method: Explanation, Demonstration, and Handouts Education comprehension: verbalized understanding, returned demonstration, verbal cues required, and needs further education   LYMPHEDEMA SELF-CARE HOME PROGRAM:  BLE lymphatic pumping there ex using- 1 sets of 10 reps, each exercise in order-  1-2 x daily, bilaterally Simple self MLD 1 x daily  MLD utilizing short neck sequence, deep diaphragmatic breathing,  stationary J strokes to functional inguinal LN, then proximal dynamic  J strokes to thigh, knee, bottleneck region, leg and foot. Sequences were repeated in reverse x distal to proximally x 3 to complete manual therapy. Daily skin care to increase hydration, skin mobility and decrease infection risk- can be done during MLD Intensive Phase CDT: Compression wraps 23/7 until garment fitting complete Self-management Phase CDT: Custom-made gradient compression garments and HOS devices are medically necessary because they are uniquely sized and shaped to fit the exact dimensions of the affected extremities, and to provide appropriate medical grade, graduated compression essential for optimally managing chronic, progressive lymphedema. Multiple custom compression garments are needed to ensure proper hygiene to limit infection risk. Custom compression garments should be replaced q 3-6 months When worn consistently for optimal lipo-lymphedema self-management over time. HOS devices, medically necessary to limit fibrosis buildup in tissue, should be replaced q 2 years and PRN when worn out.     ASSESSMENT:  CLINICAL IMPRESSION:   Pt education directed at differences between [employer] basic NIMBL device and the advanced Flexitouch Plus. Discussed application for the treatment of lipedema and lipo lymphedema. Reviewed compression wrapping while applying them. Pt encouraged to apply top part of wraps to get them positioned correctly and to demonstrate for her spouse. Pt tolerated LLE/LLQ MLD  without increased pain. Reviewed MLD sequence while demonstrating. Productive session. Cont as per POC.   (07/12/23 Initial OT eval: Angel Watkins is a 63 yo female presenting with moderate to sever, stage II, lipo-lymphedema 2/2 to Type III LIPEDEMA, Class III Obesity, and suspected venous insufficiency. Her condition involves buttocks, abdomen and bilateral lower extremities. It is exacerbated by joint inflammation,  obstructive sleep apnea and hypertension. This patient has a complex constellation of contributing factors.  Lipedema typically affects women and is characterized by painful, symmetrical, excessive deposition of fat below the waist in the legs, thighs and buttocks resulting in a disproportionate waist to hip ratio. It may also include the arms. Unlike lymphedema where swelling extends to the feet and presents as asymmetrical, the feet are spared and cuff-like accumulation of thickened, fibrotic, fatty tissue is noted at distal legs. Accompanying class III obesity favors the development of lipo-lymphedema, and/ or obesity-induced lymphedema. As lipedema progresses fatty fibrosis accumulates in the subcutis putting pressure on delicate lymphatics resulting in excessive lymph accumulation and progressive lymphedema. The combined condition of lipo-lymphedema is difficult to treat as lipedema does not typically respond well to Complete Decongestive Therapy (CDT), while lymphedema may respond to varying degrees. Well fitting, comfortable compression garments that do not constrict or roll down often provide pain relief and enable improved functional performance. An advanced, sequential, pneumatic , compression device, The Flexitouch from [employer], may reduce pain and  discomfort while helping to reduce lymphatic congestion by incorporating anatomically correct lymphatic channels into the device garments.  Extreme obesity can cause lower extremity lymphedema, termed obesity-induced lymphedema (OIL). Obesity-induced lymphedema is secondary lymphedema that may occur once an individual's body mass index (BMI) exceeds 40. Ms Womble-Miller's BMI measures 55.6 today. The risk of lymphatic dysfunction increases with elevated BMI and is almost universal once BMI exceeds 60. Patients with obesity-induced lymphedema also may develop areas of massive, localized lymphedema. Individuals with OIL are in an unfavorable cycle of  weight gain and lymphatic injury. As BMI increases lymphedema worsens, ambulation becomes more difficult, and BMI further rises. The fundamental treatment for OIL is weight loss.   Ms. Sharren Watkins will benefit from skilled Occupational Therapy to reduce limb swelling and associated pain, to limit progression and infection risk. BLE lymphedema limits functional performance in all occupational domains, including functional mobility and ambulation,  basic and instrumental ADLs, productive activities, leisure pursuits, social participation and quality of life. This patient will benefit from modified Intensive and Self Management Phase CDT . In addition to MLD, ther ex, skin care and compression bandaging below the knees, Pt will be fitted with custom knee length compression stockings paired with off the shelf , Capri length compression leggings. If insurance benefits allow, she will undergo a trial in the clinic with the advanced Flexitouch device in an effort to reduce discomfort and reduce infection risk. Without skilled OT Li-lymphedema will worsen over time and further functional decline is expected. )   OBJECTIVE IMPAIRMENTS: Abnormal gait, decreased balance, decreased knowledge of condition, decreased knowledge of use of DME, decreased mobility, difficulty walking, decreased ROM, decreased strength, increased edema, obesity, pain, and lymphedema -related pain and chronic limb swelling.   ACTIVITY LIMITATIONS: Mobility and functional ambulation limitations:  abnormal gait pattern, carrying, lifting, bending, sitting, standing, squatting, stairs, transfers, and bed mobility Basic and instrumental ADLs (reaching feet and distal legs to groom nails, inspect skin, apply lotion, bathe lower body, difficulty with LB dressing, fitting shoes and socks, impaired sleeping, meal prep, standing to cook, driving, shopping, yard work, house work Paediatric nurse activities: work related activities requiring extended  standing, walking, sitting; caring for others Social participation in the community, socializing with others, LE affects my body image completely, interferes w intimacy Leisure pursuits requiring extended standing, walking, sitting  PERSONAL FACTORS: Age, Past/current experiences, Time since onset of injury/illness/exacerbation, and 3+ comorbidities: lipedema, class III Obesity, OSA, arthritis are also affecting patient's functional outcome.   REHAB POTENTIAL:Pt has difficulty reaching feet, so requires assistance applying and removing multi-layer compression bandaging at home daily between OT visits to achieve  optimal limb volume reduction and functional outcomes. Without consistent, daily caregiver assistance with compression wrapping throughout CDT prognosis is poor. With daily assistance with wrapping, prognosis is fair due to BMI  and Lipedema co-morbidities   EVALUATION COMPLEXITY: Complex  GOALS: Goals reviewed with patient? Yes  SHORT TERM GOALS: Target date: 4th OT Rx visit   Pt will demonstrate understanding of lymphedema precautions and prevention strategies with modified independence using a printed reference to identify at least 5 precautions and discussing how s/he may implement them into daily life to reduce risk of progression with extra time. Baseline:Max A Goal status: INITIAL  2.  Pt will be able to apply multilayer, knee length, gradient, compression wraps to one leg at a time from toes to below knee with Max caregiver A x 1 to decrease limb volume, to limit infection risk, and to limit lymphedema  progression.  Baseline: Dependent Goal status: INITIAL  LONG TERM GOALS: Target date: 08/08/23 1.Given this patient's Intake score of 57.35 % on the Lymphedema Life Impact Scale (LLIS), patient will experience a reduction of at least 10 points in her perceived level of functional impairment resulting from lymphedema to improve functional performance and quality of life  (QOL). Baseline: 57.35 % Goal status: INITIAL  2.  Pt will achieve at least a 10% volume reduction in B legs to return limb to typical size and shape, to limit infection risk and LE progression, to decrease pain, to improve function. Baseline: Dependent Goal status: INITIAL  3.  Pt will obtain appropriate compression garments/devices and achieve modified independence (extra time + assistive devices) with donning/doffing to optimize limb volume reductions and limit LE progression over time. Baseline: Dependent Goal status: INITIAL  During Intensive phase CDT, with modified independence, Pt will achieve at least 85% compliance with all lymphedema self-care home program components, including daily skin care, compression wraps and /or garments, simple self MLD and lymphatic pumping therex to habituate LE self care protocol  into ADLs for optimal LE self-management over time. Baseline: Dependent Goal status: INITIAL PLAN:  OT FREQUENCY: 2x/week and PRN  OT DURATION: other: 18 weeks and PRN  PLANNED INTERVENTIONS: Complete Decongestive Therapy (CDT): Manual Lymphatic Drainage (MLD) , Skin care, ther ex, gradient [id number]-Therapeutic exercises, 97530- Therapeutic activity, 97535- Self Care, [id number]- Manual therapy, Patient/Family education, Manual lymph drainage, Compression bandaging, DME instructions, and trial with advanced, sequential, pneumatic, compression device ([employer] Flexitouch) Custom-made gradient compression garments and HOS devices are medically necessary because they are uniquely sized and shaped to fit the exact dimensions of the affected extremities, and to provide appropriate medical grade, graduated compression essential for optimally managing chronic, progressive lymphedema. Multiple custom compression garments are needed to ensure proper hygiene to limit infection risk. Custom compression garments should be replaced q 3-6 months When worn consistently for optimal  lipo-lymphedema self-management over time. HOS devices, medically necessary to limit fibrosis buildup in tissue, should be replaced q 2 years and PRN when worn out.   PLAN FOR NEXT SESSION:  Commence LLE/LLQ MLD Knee length, Multilayer compression wraps to L LEG (omit foot)    Zebedee Dec, MS, OTR/L, CLT-LANA 08/16/23 9:12 AM

## 2023-09-05 ENCOUNTER — Encounter: Payer: Self-pay | Admitting: Physical Therapy

## 2023-09-05 ENCOUNTER — Encounter: Admitting: Psychology

## 2023-09-06 ENCOUNTER — Encounter: Payer: Self-pay | Admitting: Occupational Therapy

## 2023-09-06 ENCOUNTER — Ambulatory Visit: Attending: Physician Assistant | Admitting: Occupational Therapy

## 2023-09-06 DIAGNOSIS — I890 Lymphedema, not elsewhere classified: Secondary | ICD-10-CM | POA: Diagnosis present

## 2023-09-06 NOTE — Therapy (Signed)
 OUTPATIENT OCCUPATIONAL THERAPY TREATMENT  BILATERAL LOWER EXTREMITY/ BILATERAL LOWER QUADRANT LIPO-LYMPHEDEMA  Patient Name: Angel Watkins MRN: [id number] DOB:[birth year], 63 y.o., female Today's Date: 09/06/2023  END OF SESSION:   OT End of Session - 09/06/23 0814     Visit Number 6    Number of Visits 36    Date for OT Re-Evaluation 10/10/23    OT Start Time 0805    OT Stop Time 0920    OT Time Calculation (min) 75 min    Activity Tolerance Patient tolerated treatment well;No increased pain    Behavior During Therapy WFL for tasks assessed/performed            Past Medical History:  Diagnosis Date   Anxiety    Arthritis    Back pain    Bilateral swelling of feet    Bulging lumbar disc    and cervical   Diabetes mellitus without complication (HCC)    Fibromyalgia    Hypertension    Joint pain    Lymphedema    Palpitations    Sleep apnea    Spinal stenosis    Wears glasses    Past Surgical History:  Procedure Laterality Date   ABDOMINAL HYSTERECTOMY     ANAL FISSURE REPAIR     ANTERIOR CERVICAL DECOMP/DISCECTOMY FUSION N/A 12/03/2018   Procedure: Cervical five-six  Anterior cervical decompression/discectomy/fusion;  Surgeon: Cheryle Debby LABOR, MD;  Location: [facility] OR;  Service: Neurosurgery;  Laterality: N/A;   BUNIONECTOMY Left 2009   GASTRIC BYPASS     LUMBAR LAMINECTOMY     RADIOLOGY WITH ANESTHESIA N/A 12/14/2017   Procedure: MRI WITH ANESTHESIA, LUMBAR SPINE WITHOUT CONTRAST, CERVICAL SPINE WITHOUT CONTRAST;  Surgeon: Radiologist, Medication, MD;  Location: [facility] OR;  Service: Radiology;  Laterality: N/A;   RADIOLOGY WITH ANESTHESIA N/A 10/08/2019   Procedure: MRI WITH ANESTHESIA    LUMBAR WITHOUT;  Surgeon: Radiologist, Medication, MD;  Location: [facility] OR;  Service: Radiology;  Laterality: N/A;   right shoulder total replacement     Patient Active Problem List   Diagnosis Date Noted   Morbid obesity (HCC) 07/14/2023   Hyperlipidemia associated with  type 2 diabetes mellitus (HCC) 05/29/2023   SOBOE (shortness of breath on exertion) 05/24/2023   Lymphedema 03/08/2023   Anxiety and depression 12/06/2022   Eating disorder 12/02/2020   Vitamin D  deficiency 12/02/2020   Type 2 diabetes mellitus with hyperglycemia, without long-term current use of insulin  (HCC) 07/20/2020   Hypertension associated with diabetes (HCC) 07/20/2020   Class 3 severe obesity with serious comorbidity and body mass index (BMI) of 50.0 to 59.9 in adult 07/20/2020   Cervical radiculopathy 12/03/2018    PCP: Gerard Roys, PA ([facility], [place], [place])  REFERRING PROVIDER: Toribio Kerry, MD  REFERRING DIAG: I89.0  THERAPY DIAG:  Lymphedema, not elsewhere classified  Rationale for Evaluation and Treatment: Rehabilitation  ONSET DATE: 2002 s/p gastric bypass.  SUBJECTIVE:  SUBJECTIVE STATEMENT: Angel Watkins returns to OT to continue Lymphedema treatment to BLE. Pt is accompanied by her husband, , [name], today. [name] has been wrapping her and it's helping.  Pt reports 3/10 pins and needles in bilateral thighs. Manufacturer's rep from [employer] due here this morning to assist with a trial of the advanced Flexitouch sequential pneumatic compression device. Rep cancelled via email due to illness. Rescheduled for next week.  (07/12/23 OT Initial Eval: Angel Watkins is referred to Occupational Therapy by Toribio Kerry, MD at [facility] for evaluation and treatment of BLE/BLQ Lipo-lymphedema.Pt reports she first noticed leg swelling after gastric bypass surgery. She states, I lost 100 pounds, but my legs just got bigger instead of going down. Pt reports lower extremity and lower quadrant ( buttocks, abdomen, hips) worsened continues to worsen over time. She is unable to fit off the  shelf compression stockings. Diuretics, prescribed in the past, do not reduce swelling. Pt describes lower extremity/ lower quadrant pain and discomfort as heaviness. She rates  this painful sensation as 6-7/10. Pt reports her mother had some symptoms of lipedema, but she is unaware of other relatives that may have had lymphedema or lipedema. Pt denies hx of cellulitis and blood clots. Pt reports her mother had the same body type with a small waist and large, fatty hips and legs.    PERTINENT HISTORY:   Anxiety    Arthritis    Back pain    Bilateral swelling of feet    Bulging lumbar disc    and cervical   Diabetes mellitus without complication (HCC)    Fibromyalgia    Hypertension    Joint pain    Palpitations    Sleep apnea    Spinal stenosis   Gastric bypass 2002   Class 3 severe obesity with serious comorbidity and body mass index (BMI) of 50.0 to 59.9 in adult (BMI measured this date 55.6)   Cervical radiculopathy    PAIN:  Are you having pain? No Yes, 3/10 pins and needs, R>L thigh  PRECAUTIONS: Other: LYMPHEDEMA PRECAUTIONS  WEIGHT BEARING RESTRICTIONS: No  FALLS:  Has patient fallen in last 6 months? Yes. Number of falls 1; Just got a new scooter. It caught my shirt and pulled me down. But I wasn't hurt.  LIVING ENVIRONMENT: Lives with: lives with their spouse Lives in: House/apartment Stairs: Yes; External: 3 steps; can reach both Has following equipment at home: Single point cane, shower chair, and hand held shower  OCCUPATION: Advertising account executive full time  LEISURE: enjoys visiting vineyards  HAND DOMINANCE: right   PRIOR LEVEL OF FUNCTION: Independent  PATIENT GOALS: 1. Get swelling under control     2. Limit progression   OBJECTIVE: Note: Objective measures were completed at Evaluation unless otherwise noted.  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 Non cancer related  INFECTIONS: Denies hx of cellulitis  WOUNDS: Denies hx of leg wounds   BLE COMPARATIVE LIMB VOLUMETRICS : Initial 07/26/23  LANDMARK RIGHT  (dominant)  R LEG (A-D) 7303.9 ml  R THIGH (E-G) ml  R FULL LIMB (A-G) ml  Limb Volume differential (LVD)  LVD measures 1.51 % ,  L>R  Volume change since last measured %  Volume change since initial V  (Blank rows = not tested)  LANDMARK LEFT   L LEG (A-D) 7193.7 ml  L THIGH (E-G) ml  L FULL LIMB (A-G) ml  Limb Volume differential (LVD)  %  Volume change since initial %  Volume change overall %  (Blank rows = not tested)    Moderate-Severe, Stage  II, Bilateral Lower Extremity Lipo- Lymphedema 2/2  Severe, Type III Lipedema involving buttocks, abdomen and ankles  Skin  Description Hyper-Keratosis Peau d' Orange Shiny Tight Fibrotic/ Indurated Fatty Doughy Spongy/ boggy    x   x x x x   Skin dry Flaky WNL Macerated   mild      Color Redness Varicosities Blanching Hemosiderin Stain Mottled    x    x   Odor Malodorous Yeast Fungal infection  WNL      x   Temperature Warm Cool wnl    x     Pitting Edema   1+ 2+ 3+ 4+ Non-pitting         x   Girth Symmetrical Asymmetrical                   Distribution   x  Toes to groin, abdomen, buttocks    Stemmer Sign Positive Negative   +    Lymphorrhea History Of:  Present Absent     x    Wounds History Of Present Absent Venous Arterial Pressure Sheer     x        Signs of Infection Redness Warmth Erythema Acute Swelling Drainage Borders                    Sensation Light Touch Deep pressure Hypersensitivity   Impaired In tact Impaired In tact Absent Impaired    x  x  x     Nails WNL   Fungus nail dystrophy   x     Hair Growth Symmetrical Asymmetrical   x    Skin Creases Base of toes  Ankles   Base of Fingers knees       Abdominal pannus Thigh Lobules  Face/neck    x  x x x     GAIT: Distance walked: >500' Assistive device utilized: Single point cane Level of assistance: Modified independence Comments: extra time needed  LYMPHEDEMA LIFE IMPACT SCALE (LLIS): 57.35% (The extent to which lymphedema related problems affected your life during the past week.)                                                                                                                          TREATMENT DATE:  Pt/family edu for LE self-care R Leg multilayer compression bandaging  PATIENT EDUCATION:  Continued Pt/ CG edu for lymphedema self care home program throughout session. Topics include outcome of comparative limb volumetrics- starting limb volume differentials (LVDs), technology and gradient techniques used for short stretch,  multilayer compression wrapping, simple self-MLD, therapeutic lymphatic pumping exercises, skin/nail care, LE precautions, compression garment recommendations and specifications, wear and care schedule and compression garment donning / doffing w assistive devices. Discussed progress towards all OT goals since commencing CDT. Discussed detrimental impact of obesity on lower and upper extremity lymphedema over time. Reviewed OT goals for lymphedema care with Pt and discussed progress to date.  All questions answered to the Pt's satisfaction. Good return. Person educated: Patient and spouse Education method: Explanation, Demonstration, and Handouts Education comprehension: verbalized understanding, returned demonstration, verbal cues required, and needs further education   LYMPHEDEMA SELF-CARE HOME PROGRAM:  BLE lymphatic pumping there ex using- 1 sets of 10 reps, each exercise in order-  1-2 x daily, bilaterally Simple self MLD 1 x daily   MLD utilizing short neck sequence, deep diaphragmatic breathing, stationary J strokes to functional inguinal LN, then proximal dynamic  J strokes to thigh, knee, bottleneck region, leg and foot. Sequences were repeated in reverse x distal to proximally x 3 to complete manual therapy. Daily skin care to increase hydration, skin mobility and decrease infection risk- can be done during MLD Intensive Phase CDT: Compression wraps 23/7 until garment fitting complete Self-management Phase CDT: Custom-made gradient compression garments and HOS devices are medically necessary because they are uniquely sized and shaped to fit the exact dimensions of the affected extremities, and to provide appropriate medical grade, graduated compression essential for optimally managing chronic, progressive lymphedema. Multiple custom compression garments are needed to ensure proper hygiene to limit infection risk. Custom compression garments should be replaced q 3-6 months When worn consistently for optimal lipo-lymphedema self-management over time. HOS devices, medically necessary to limit fibrosis buildup in tissue, should be replaced q 2 years and PRN when worn out.     ASSESSMENT:  CLINICAL IMPRESSION:   Upon visual assessment and palpation L Leg volume is obviously reduced since last visit on 7/16. Since [employer] rep is a no show we dedicated this visit to Pt and family edu re  compression garment and device options and recommendations. We worked with off the shelf , Velcro wrap style leggings that Pt purchased on [employer] thinking these would come in handy on mornings when she's not had enough time to apply wraps. Despite spouse's assistance , morning wrapping on work days has been an ongoing problem. The OTS wraps do not fit and are unable to be altered for correct fit. Demonstrated [employer] Juxtafit Essentials leggings and measurements reveal Pt is able to fit into a ready made size bilaterally. Educated Pt re Juzo Sensation  leggings to be pared with custom Elvarex stockings. Measured her and it appears she will fit in to the largest Ready Made size, XLP. Provided resources for Pt to do some further research. Applied multilayer gradient compression wraps to RLE today instead of left by patient request. Rescheduled Flex[employer] trial with rep after visit. Cont as per POC.  (07/12/23 Initial OT eval: Tikesha Mort is a 63 yo female presenting with moderate to sever, stage II, lipo-lymphedema 2/2 to Type III LIPEDEMA, Class III Obesity, and suspected venous insufficiency. Her condition involves buttocks, abdomen and bilateral lower extremities. It is exacerbated by joint inflammation, obstructive sleep apnea and hypertension. This patient has a complex constellation of contributing factors.  Lipedema typically affects women and is characterized by painful, symmetrical, excessive deposition of fat below the waist in the legs, thighs and buttocks resulting in a disproportionate waist to hip ratio. It may also include the arms. Unlike lymphedema  where swelling extends to the feet and presents as asymmetrical, the feet are spared and cuff-like accumulation of thickened, fibrotic, fatty tissue is noted at distal legs. Accompanying class III obesity favors the development of lipo-lymphedema, and/ or obesity-induced lymphedema. As lipedema progresses fatty fibrosis accumulates in the subcutis putting pressure on delicate lymphatics resulting in excessive lymph accumulation and progressive lymphedema. The combined condition of lipo-lymphedema is difficult to treat as lipedema does not typically respond well to Complete Decongestive Therapy (CDT), while lymphedema may respond to varying degrees. Well fitting, comfortable compression garments that do not constrict or roll down often provide pain relief and enable improved functional performance. An advanced, sequential, pneumatic , compression device, The Flexitouch from [employer], may  reduce pain and discomfort while helping to reduce lymphatic congestion by incorporating anatomically correct lymphatic channels into the device garments.  Extreme obesity can cause lower extremity lymphedema, termed obesity-induced lymphedema (OIL). Obesity-induced lymphedema is secondary lymphedema that may occur once an individual's body mass index (BMI) exceeds 40. Ms Womble-Miller's BMI measures 55.6 today. The risk of lymphatic dysfunction increases with elevated BMI and is almost universal once BMI exceeds 60. Patients with obesity-induced lymphedema also may develop areas of massive, localized lymphedema. Individuals with OIL are in an unfavorable cycle of weight gain and lymphatic injury. As BMI increases lymphedema worsens, ambulation becomes more difficult, and BMI further rises. The fundamental treatment for OIL is weight loss.   Ms. Sharren Watkins will benefit from skilled Occupational Therapy to reduce limb swelling and associated pain, to limit progression and infection risk. BLE lymphedema limits functional performance in all occupational domains, including functional mobility and ambulation,  basic and instrumental ADLs, productive activities, leisure pursuits, social participation and quality of life. This patient will benefit from modified Intensive and Self Management Phase CDT . In addition to MLD, ther ex, skin care and compression bandaging below the knees, Pt will be fitted with custom knee length compression stockings paired with off the shelf , Capri length compression leggings. If insurance benefits allow, she will undergo a trial in the clinic with the advanced Flexitouch device in an effort to reduce discomfort and reduce infection risk. Without skilled OT Li-lymphedema will worsen over time and further functional decline is expected. )   OBJECTIVE IMPAIRMENTS: Abnormal gait, decreased balance, decreased knowledge of condition, decreased knowledge of use of DME, decreased mobility,  difficulty walking, decreased ROM, decreased strength, increased edema, obesity, pain, and lymphedema -related pain and chronic limb swelling.   ACTIVITY LIMITATIONS: Mobility and functional ambulation limitations:  abnormal gait pattern, carrying, lifting, bending, sitting, standing, squatting, stairs, transfers, and bed mobility Basic and instrumental ADLs (reaching feet and distal legs to groom nails, inspect skin, apply lotion, bathe lower body, difficulty with LB dressing, fitting shoes and socks, impaired sleeping, meal prep, standing to cook, driving, shopping, yard work, house work Paediatric nurse activities: work related activities requiring extended standing, walking, sitting; caring for others Social participation in the community, socializing with others, LE affects my body image completely, interferes w intimacy Leisure pursuits requiring extended standing, walking, sitting  PERSONAL FACTORS: Age, Past/current experiences, Time since onset of injury/illness/exacerbation, and 3+ comorbidities: lipedema, class III Obesity, OSA, arthritis are also affecting patient's functional outcome.   REHAB POTENTIAL:Pt has difficulty reaching feet, so requires assistance applying and removing multi-layer compression bandaging at home daily between OT visits to achieve  optimal limb volume reduction and functional outcomes. Without consistent, daily caregiver assistance with compression wrapping throughout CDT prognosis is poor. With daily  assistance with wrapping, prognosis is fair due to BMI  and Lipedema co-morbidities   EVALUATION COMPLEXITY: Complex  GOALS: Goals reviewed with patient? Yes  SHORT TERM GOALS: Target date: 4th OT Rx visit   Pt will demonstrate understanding of lymphedema precautions and prevention strategies with modified independence using a printed reference to identify at least 5 precautions and discussing how s/he may implement them into daily life to reduce risk of progression  with extra time. Baseline:Max A Goal status: INITIAL  2.  Pt will be able to apply multilayer, knee length, gradient, compression wraps to one leg at a time from toes to below knee with Max caregiver A x 1 to decrease limb volume, to limit infection risk, and to limit lymphedema progression.  Baseline: Dependent Goal status: INITIAL  LONG TERM GOALS: Target date: 08/08/23 1.Given this patient's Intake score of 57.35 % on the Lymphedema Life Impact Scale (LLIS), patient will experience a reduction of at least 10 points in her perceived level of functional impairment resulting from lymphedema to improve functional performance and quality of life (QOL). Baseline: 57.35 % Goal status: INITIAL  2.  Pt will achieve at least a 10% volume reduction in B legs to return limb to typical size and shape, to limit infection risk and LE progression, to decrease pain, to improve function. Baseline: Dependent Goal status: INITIAL  3.  Pt will obtain appropriate compression garments/devices and achieve modified independence (extra time + assistive devices) with donning/doffing to optimize limb volume reductions and limit LE progression over time. Baseline: Dependent Goal status: INITIAL  During Intensive phase CDT, with modified independence, Pt will achieve at least 85% compliance with all lymphedema self-care home program components, including daily skin care, compression wraps and /or garments, simple self MLD and lymphatic pumping therex to habituate LE self care protocol  into ADLs for optimal LE self-management over time. Baseline: Dependent Goal status: INITIAL PLAN:  OT FREQUENCY: 2x/week and PRN  OT DURATION: other: 18 weeks and PRN  PLANNED INTERVENTIONS: Complete Decongestive Therapy (CDT): Manual Lymphatic Drainage (MLD) , Skin care, ther ex, gradient [id number]-Therapeutic exercises, 97530- Therapeutic activity, 97535- Self Care, [id number]- Manual therapy, Patient/Family education, Manual  lymph drainage, Compression bandaging, DME instructions, and trial with advanced, sequential, pneumatic, compression device ([employer] Flexitouch) Custom-made gradient compression garments and HOS devices are medically necessary because they are uniquely sized and shaped to fit the exact dimensions of the affected extremities, and to provide appropriate medical grade, graduated compression essential for optimally managing chronic, progressive lymphedema. Multiple custom compression garments are needed to ensure proper hygiene to limit infection risk. Custom compression garments should be replaced q 3-6 months When worn consistently for optimal lipo-lymphedema self-management over time. HOS devices, medically necessary to limit fibrosis buildup in tissue, should be replaced q 2 years and PRN when worn out.   PLAN FOR NEXT SESSION:  Commence LLE/LLQ MLD Knee length, Multilayer compression wraps to L LEG (omit foot)    Zebedee Dec, MS, OTR/L, CLT-LANA 09/06/23 9:38 AM

## 2023-09-12 ENCOUNTER — Ambulatory Visit: Admitting: Nurse Practitioner

## 2023-09-13 ENCOUNTER — Encounter: Payer: Self-pay | Admitting: Occupational Therapy

## 2023-09-13 ENCOUNTER — Ambulatory Visit: Admitting: Occupational Therapy

## 2023-09-13 DIAGNOSIS — I890 Lymphedema, not elsewhere classified: Secondary | ICD-10-CM | POA: Diagnosis not present

## 2023-09-13 NOTE — Therapy (Signed)
 OUTPATIENT OCCUPATIONAL THERAPY TREATMENT - Flexitouch Trial  BILATERAL LOWER EXTREMITY/ BILATERAL LOWER QUADRANT LIPO-LYMPHEDEMA  Patient Name: Angel Watkins MRN: [id number] DOB:Dec 10, 1960, 63 y.o., female Today's Date: 09/13/2023  END OF SESSION:   OT End of Session - 09/13/23 0821     Visit Number 7    Number of Visits 36    Date for OT Re-Evaluation 10/10/23    OT Start Time 0800    Activity Tolerance Patient tolerated treatment well;No increased pain    Behavior During Therapy WFL for tasks assessed/performed            Past Medical History:  Diagnosis Date   Anxiety    Arthritis    Back pain    Bilateral swelling of feet    Bulging lumbar disc    and cervical   Diabetes mellitus without complication (HCC)    Fibromyalgia    Hypertension    Joint pain    Lymphedema    Palpitations    Sleep apnea    Spinal stenosis    Wears glasses    Past Surgical History:  Procedure Laterality Date   ABDOMINAL HYSTERECTOMY     ANAL FISSURE REPAIR     ANTERIOR CERVICAL DECOMP/DISCECTOMY FUSION N/A 12/03/2018   Procedure: Cervical five-six  Anterior cervical decompression/discectomy/fusion;  Surgeon: Cheryle Debby LABOR, MD;  Location: [facility] OR;  Service: Neurosurgery;  Laterality: N/A;   BUNIONECTOMY Left 2009   GASTRIC BYPASS     LUMBAR LAMINECTOMY     RADIOLOGY WITH ANESTHESIA N/A 12/14/2017   Procedure: MRI WITH ANESTHESIA, LUMBAR SPINE WITHOUT CONTRAST, CERVICAL SPINE WITHOUT CONTRAST;  Surgeon: Radiologist, Medication, MD;  Location: [facility] OR;  Service: Radiology;  Laterality: N/A;   RADIOLOGY WITH ANESTHESIA N/A 10/08/2019   Procedure: MRI WITH ANESTHESIA    LUMBAR WITHOUT;  Surgeon: Radiologist, Medication, MD;  Location: [facility] OR;  Service: Radiology;  Laterality: N/A;   right shoulder total replacement     Patient Active Problem List   Diagnosis Date Noted   Morbid obesity (HCC) 07/14/2023   Hyperlipidemia associated with type 2 diabetes mellitus (HCC)  05/29/2023   SOBOE (shortness of breath on exertion) 05/24/2023   Lymphedema 03/08/2023   Anxiety and depression 12/06/2022   Eating disorder 12/02/2020   Vitamin D  deficiency 12/02/2020   Type 2 diabetes mellitus with hyperglycemia, without long-term current use of insulin  (HCC) 07/20/2020   Hypertension associated with diabetes (HCC) 07/20/2020   Class 3 severe obesity with serious comorbidity and body mass index (BMI) of 50.0 to 59.9 in adult 07/20/2020   Cervical radiculopathy 12/03/2018    PCP: Gerard Roys, PA ([facility], [place], [place])  REFERRING PROVIDER: Toribio Kerry, MD  REFERRING DIAG: I89.0  THERAPY DIAG:  Lymphedema, not elsewhere classified  Rationale for Evaluation and Treatment: Rehabilitation  ONSET DATE: 2002 s/p gastric bypass.  SUBJECTIVE:  SUBJECTIVE STATEMENT: Angel Watkins returns to OT to continue Lymphedema treatment to BLE. Pt is accompanied by her husband, Angel Watkins, today. Angel Watkins has been wrapping her and it's helping.  Pt reports 3/10 pins and needles in bilateral thighs. Manufacturer's rep from [employer] due here this morning to assist with a trial of the advanced Flexitouch sequential pneumatic compression device. Mrs Onstad commenced OT for Complete Decongestive Therapy (CDT)  on 07/12/23. Pt has been using the [employer] basic NIMBL sequential pneumatic compression device, or pump, daily for over a month without significant improvement in her condition. The advanced device is medically necessary to treat swelling that extends proximally above the thighs into the hips, buttocks and abdomen that the basic device does not cover.  (07/12/23 OT Initial Eval: Angel Watkins is referred to Occupational Therapy by Toribio Kerry, MD at [facility] for evaluation and  treatment of BLE/BLQ Lipo-lymphedema.Pt reports she first noticed leg swelling after gastric bypass surgery. She states, I lost 100 pounds, but my legs just got bigger instead of going down. Pt reports lower extremity and lower quadrant ( buttocks, abdomen, hips) worsened continues to worsen over time. She is unable to fit off the shelf compression stockings. Diuretics, prescribed in the past, do not reduce swelling. Pt describes lower extremity/ lower quadrant pain and discomfort as heaviness. She rates  this painful sensation as 6-7/10. Pt reports her mother had some symptoms of lipedema, but she is unaware of other relatives that may have had lymphedema or lipedema. Pt denies hx of cellulitis and blood clots. Pt reports her mother had the same body type with a small waist and large, fatty hips and legs.    PERTINENT HISTORY:   Anxiety    Arthritis    Back pain    Bilateral swelling of feet    Bulging lumbar disc    and cervical   Diabetes mellitus without complication (HCC)    Fibromyalgia    Hypertension    Joint pain    Palpitations    Sleep apnea    Spinal stenosis   Gastric bypass 2002   Class 3 severe obesity with serious comorbidity and body mass index (BMI) of 50.0 to 59.9 in adult (BMI measured this date 55.6)   Cervical radiculopathy    PAIN:  Are you having pain?  Yes, 3/10 pins and needs, R>L thigh  PRECAUTIONS: Other: LYMPHEDEMA PRECAUTIONS  WEIGHT BEARING RESTRICTIONS: No  FALLS:  Has patient fallen in last 6 months? Yes. Number of falls 1; Just got a new scooter. It caught my shirt and pulled me down. But I wasn't hurt.  LIVING ENVIRONMENT: Lives with: lives with their spouse Lives in: House/apartment Stairs: Yes; External: 3 steps; can reach both Has following equipment at home: Single point cane, shower chair, and hand held shower  OCCUPATION: Advertising account executive full time  LEISURE: enjoys visiting vineyards  HAND DOMINANCE: right   PRIOR LEVEL OF  FUNCTION: Independent  PATIENT GOALS: 1. Get swelling under control     2. Limit progression  OBJECTIVE: Note: Objective measures were completed at Evaluation unless otherwise noted.  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 Non cancer related  INFECTIONS:  Denies hx of cellulitis  WOUNDS: Denies hx of leg wounds   BLE COMPARATIVE LIMB VOLUMETRICS : Initial 07/26/23  LANDMARK RIGHT  (dominant)  R LEG (A-D) 7303.9 ml  R THIGH (E-G) ml  R FULL LIMB (A-G) ml  Limb Volume differential (LVD)  LVD measures 1.51 % , L>R  Volume change since last measured %  Volume change since initial V  (Blank rows = not tested)  LANDMARK LEFT   L LEG (A-D) 7193.7 ml  L THIGH (E-G) ml  L FULL LIMB (A-G) ml  Limb Volume differential (LVD)  %  Volume change since initial %  Volume change overall %  (Blank rows = not tested)    Moderate-Severe, Stage  II, Bilateral Lower Extremity Lipo- Lymphedema 2/2  Severe, Type III Lipedema involving buttocks, abdomen and ankles  Skin  Description Hyper-Keratosis Peau d' Orange Hyperplasia Tight Fibrotic/ Indurated Fatty Doughy Spongy/ boggy    x x  x x x x   Skin dry Flaky WNL Macerated   mild      Color Redness Varicosities Blanching Hemosiderin Stain Mottled    x    x   Odor Malodorous Yeast Fungal infection  WNL      x   Temperature Warm Cool wnl    x     Pitting Edema   1+ 2+ 3+ 4+ Non-pitting         x   Girth Symmetrical Asymmetrical                   Distribution   x  Toes to groin, abdomen, buttocks    Stemmer Sign Positive Negative   +    Lymphorrhea  History Of:  Present Absent     x    Wounds History Of Present Absent Venous Arterial Pressure Sheer     x        Signs of Infection Redness Warmth Erythema Acute Swelling Drainage Borders                    Sensation Light Touch Deep pressure Hypersensitivity   Impaired In tact Impaired In tact Absent Impaired    x  x  x    Nails WNL   Fungus nail dystrophy   x     Hair Growth Symmetrical Asymmetrical   x    Skin Creases Base of toes  Ankles   Base of Fingers knees       Abdominal pannus Thigh Lobules  Face/neck    x  x x x     GAIT: Distance walked: >500' Assistive device utilized: Single point cane Level of assistance: Modified independence Comments: extra time needed  LYMPHEDEMA LIFE IMPACT SCALE (LLIS): 57.35% (The extent to which lymphedema related problems affected your life during the past week.)  TREATMENT THIS DATE:  Pt/family edu for LE self-care 60 minute, RLE/RLQ trial of [employer] Flexitouch advanced sequential device trial -30-40 mmHg Multilayer gradient compression bandages- knee length on R  PATIENT EDUCATION:  Continued Pt/ CG edu for lymphedema self care home program throughout session. Topics include outcome of comparative limb volumetrics- starting limb volume differentials (LVDs), technology and gradient techniques used for short stretch, multilayer compression wrapping, simple self-MLD, therapeutic lymphatic pumping exercises, skin/nail care, LE precautions, compression garment recommendations and specifications, wear and care schedule and compression garment donning / doffing w assistive devices. Discussed progress towards all OT goals since commencing CDT. Discussed detrimental impact of obesity on lower and upper extremity lymphedema over time. Reviewed OT goals for lymphedema care with Pt and discussed progress to  date.   Provided Pt education re precautions related to use of advanced sequential pneumatic compression device. Pt verbalized understanding that she should never use device on 2 arms/legs simultaneously and should not use device 2 x on same day to avoid overloading her heart with fluid volume return both at present, and in years to come should her medical condition change. Pt instructed to remove device immediately should she experience atypical SOP, light headedness, or acute pain. Pt instructed to discontinue pump if she suspects, or has any infection, blood clot, cellulitis, the flu, corona virus, etc.  All questions answered to the Pt's satisfaction. Good return. Person educated: Patient and spouse Education method: Explanation, Demonstration, and Handouts Education comprehension: verbalized understanding, returned demonstration, verbal cues required, and needs further education   LYMPHEDEMA SELF-CARE HOME PROGRAM:  BLE lymphatic pumping there ex using- 1 sets of 10 reps, each exercise in order-  1-2 x daily, bilaterally Simple self MLD 1 x daily  MLD utilizing short neck sequence, deep diaphragmatic breathing, stationary J strokes to functional inguinal LN, then proximal dynamic  J strokes to thigh, knee, bottleneck region, leg and foot. Sequences were repeated in reverse x distal to proximally x 3 to complete manual therapy. Daily skin care to increase hydration, skin mobility and decrease infection risk- can be done during MLD Intensive Phase CDT: Compression wraps 23/7 until garment fitting complete Self-management Phase CDT: Custom-made gradient compression garments and HOS devices are medically necessary because they are uniquely sized and shaped to fit the exact dimensions of the affected extremities, and to provide appropriate medical grade, graduated compression essential for optimally managing chronic, progressive lymphedema. Multiple custom compression garments are needed to ensure  proper hygiene to limit infection risk. Custom compression garments should be replaced q 3-6 months When worn consistently for optimal lipo-lymphedema self-management over time. HOS devices, medically necessary to limit fibrosis buildup in tissue, should be replaced q 2 years and PRN when worn out.    Daily   ASSESSMENT:  CLINICAL IMPRESSION:   Kyona Chauncey presents with moderate-severe, stage II, BLE Lipo-lymphedema involving bilateral lower extremities, hips, buttocks and abdomen. Pt has undergone OT for CDT since 07/12/23. Pt has used the [employer] basic NIMBL sequential pneumatic compression device, or pump, for more than 4 weeks with persistent chronic, progressive swelling and associated pain. The basic pump has failed to control swelling and reduce pain. An advanced Flexitouch device is medically necessary to decongest the lower quadrant hips, buttocks and abdomen to facilitate lymphatic decongestion out of the limbs and through proximal pathways above the groin to the thoracic duct.   Moderate-Severe, Stage  II, Bilateral Lower Extremity Lipo- Lymphedema 2/2  Severe, Type III Lipedema involving BLE, buttocks, abdomen and  hips DIAGNOSIS: [x]  Lymphedema, not elsewhere classified [I89.0]  []  Hereditary lymphedema [Q82.0]  []  Postmastectomy lymphedema [I97.2]   []  Chronic Venous Stasis ulcers [I87.2] (non-healing despite 6 months of ongoing treatment)  SWELLING SEVERITY:  International Society of Lymphology clinical classification: []  Stage 0: Latent or subclinical condition where swelling is not yet evident despite impaired lymph transport, subtle alterations in tissue fluid/composition and changes in subjective symptoms.  It may exist months or years before overt edema occurs.  []  Stage I: Early accumulation of fluid relatively high in protein content (e.g., in comparison with "venous" edema) which subsides with limb elevation.  Pitting may occur.  An increase in various types of  proliferating cells may also be seen.  [x]  Stage II: Limb elevation alone rarely reduces the tissue swelling and pitting is manifest.  Later in Stage II, the limb may not pit as excess subcutaneous fat and fibrosis develop.  []  Stage III: Lymphostatic elephantiasis where pitting can be absent and trophic skin changes such as acanthosis, alterations in skin character and thickness, further deposition of fat and fibrosis, and warty overgrowths have developed.   SYMPTOMS: Patient exhibits the following symptoms despite conservative therapy: []  Hyperkeratosis  [x]  Hyperplasia  [x]  Hyperpigmentation  []  Skin breakdown with lymphorrhea (skin weeping)  []  Papillomatosis  []  Recurrent cellulitis  [x]  Fibrosis   []  Elephantiasis [x]  Progressive edema [x]  Truncal/abdominal swelling []  Chest/axillary swelling []  Genital swelling [x]  Unable to control swelling [x]  Impaired ROM [x]  Impaired mobility [x]  Pain []  Scarring CONSERVATIVE TREATMENT: Patient has tried conservative treatments:  [x]  Yes []  No  If YES, specify type of conservative treatment and duration of use: Compression garments:    []  <4 weeks   []  1-5 months   []  6-12 months  []  >1 year Bandaging:     []  <4 weeks   [x]  1-5 months   []  6-12 months  []  >1 year Elevation:       []  <4 weeks   [x]  1-5 months   []  6-12 months  []  >1 year  Exercise:         []  <4 weeks   [x]  1-5 months   []  6-12 months  []  >1 year  Complete Decongestive Therapy (CDT) []  <4 weeks   [x]  1-5 months   []  6-12 months  []  >1 year  Instructed on self-manual lymphatic drainage techniques (self-MLD): [x]  Yes []  No Instructed on appropriate skin/nail care practices:    [x]  Yes []  No Evaluation of diet and medications:      [x]  Yes []  No  PNEUMATIC COMPRESSION DEVICE EVALUATION: Patient has tried an Z9348 basic pneumatic compression device?  [x]  Yes []  No  If YES: Z9348 basic pneumatic compression device [employer] NIM[employer] was used at 30-400 mmHg for:  []  <4 weeks    [x]  1-5 months   []  6-12 months  []  >1 year  []  Discontinued use prior to 4 weeks due to negative symptoms Was the E0651 basic pneumatic compression device effective in managing the patient's lymphedema?  []  Yes []  No   If NO, an [phone] was not used, the following symptoms demonstrate why it was ineffective or deemed not clinically appropriate to manage the patient's lymphedema: []  E0651 basic pneumatic compression device caused or exacerbated swelling proximal to pump sleeve (top of thigh, genitals, abdomen, hips, buttocks, chest, etc.)  []  Z9348 basic pneumatic compression device was unable to accommodate limb size  []  Patient unable to tolerate or do not recommend Z9348 basic pneumatic compression  device due to pain/compromised skin integrity (e.g., open wounds/ulcers, scarring, sensitivity)  [x]  Z9348 basic pneumatic compression device did not control swelling or result in clinical improvement  Is an E0652 advanced pneumatic compression device medically necessary to treat the clinical symptoms listed above that prevent effective treatment with the E0651 basic pneumatic compression device? [x]  Yes []  No   TREATMENT PLAN: Patient to complete or continue the following treatment(s):  Compression Garments and/or Bandaging   Yes [x]  No Exercise       [x]  Yes []  No Elevation      [x]  Yes []  No E0651 Basic Pneumatic Compression Device   []  Yes [x]  No E0652 Advanced Pneumatic Compression Device  [x]  Yes []  No Professional Lymphedema Treatment (CDT)  [x]  Yes []  No Self-MLD       [x]  Yes []  No   MEASUREMENTS: NIMBL Pre and post trial circumferential measurements reveal increases in circumferences at all anatomical landmarks over time. These data demonstrate a failure of the NIMBL basic sequential pneumatic device for lymphedema control.   Lower extremity measurements (measure the largest circumferential point of the listed area):  Date: 04/20/23 NIMBL Pre trial  Date: 04/20/23 NIMBL Pre trial   [x]   Right Leg  Measurements   [x]   Left Leg Measurements  Inseam 63 cm  Inseam 63 cm  Thigh 87 cm  Thigh 84 cm  Knee  58 cm  Knee 61 cm  Calf 47 cm  Calf 48 cm  Ankle 27 cm  Ankle 28 cm  Hips 160 cm  Hips _________cm  Abdomen/Trunk _________cm  Abdomen/Trunk _________cm    Date:09/13/23 NIMBL Post trial  Date:09/13/23  NIMBL Post trial   [x]   Right Leg  Measurements   [x]   Left Leg Measurements    Inseam _________cm  Inseam _________cm   Thigh 93.5 cm  Thigh 96 cm   Knee 60.5 cm  Knee 61.5 cm   Calf 60 cm  Calf  61 cm   Ankle 34 cm  Ankle 35 cm   Hips 168 cm  Hips _________cm   Abdomen _________cm  Abdomen _________cm     Clinician Signature: TG  (07/12/23 Initial OT eval: Lilybeth Vien is a 63 yo female presenting with moderate to sever, stage II, lipo-lymphedema 2/2 to Type III LIPEDEMA, Class III Obesity, and suspected venous insufficiency. Her condition involves buttocks, abdomen and bilateral lower extremities. It is exacerbated by joint inflammation, obstructive sleep apnea and hypertension. This patient has a complex constellation of contributing factors.  Lipedema typically affects women and is characterized by painful, symmetrical, excessive deposition of fat below the waist in the legs, thighs and buttocks resulting in a disproportionate waist to hip ratio. It may also include the arms. Unlike lymphedema where swelling extends to the feet and presents as asymmetrical, the feet are spared and cuff-like accumulation of thickened, fibrotic, fatty tissue is noted at distal legs. Accompanying class III obesity favors the development of lipo-lymphedema, and/ or obesity-induced lymphedema. As lipedema progresses fatty fibrosis accumulates in the subcutis putting pressure on delicate lymphatics resulting in excessive lymph accumulation and progressive lymphedema. The combined condition of lipo-lymphedema is difficult to treat as lipedema does not typically respond well to  Complete Decongestive Therapy (CDT), while lymphedema may respond to varying degrees. Well fitting, comfortable compression garments that do not constrict or roll down often provide pain relief and enable improved functional performance. An advanced, sequential, pneumatic , compression device, The Flexitouch from [employer], may reduce pain and discomfort while helping  to reduce lymphatic congestion by incorporating anatomically correct lymphatic channels into the device garments.  Extreme obesity can cause lower extremity lymphedema, termed obesity-induced lymphedema (OIL). Obesity-induced lymphedema is secondary lymphedema that may occur once an individual's body mass index (BMI) exceeds 40. Ms Womble-Miller's BMI measures 55.6 today. The risk of lymphatic dysfunction increases with elevated BMI and is almost universal once BMI exceeds 60. Patients with obesity-induced lymphedema also may develop areas of massive, localized lymphedema. Individuals with OIL are in an unfavorable cycle of weight gain and lymphatic injury. As BMI increases lymphedema worsens, ambulation becomes more difficult, and BMI further rises. The fundamental treatment for OIL is weight loss.   Ms. Sharren Watkins will benefit from skilled Occupational Therapy to reduce limb swelling and associated pain, to limit progression and infection risk. BLE lymphedema limits functional performance in all occupational domains, including functional mobility and ambulation,  basic and instrumental ADLs, productive activities, leisure pursuits, social participation and quality of life. This patient will benefit from modified Intensive and Self Management Phase CDT . In addition to MLD, ther ex, skin care and compression bandaging below the knees, Pt will be fitted with custom knee length compression stockings paired with off the shelf , Capri length compression leggings. If insurance benefits allow, she will undergo a trial in the clinic with the  advanced Flexitouch device in an effort to reduce discomfort and reduce infection risk. Without skilled OT Li-lymphedema will worsen over time and further functional decline is expected. )   OBJECTIVE IMPAIRMENTS: Abnormal gait, decreased balance, decreased knowledge of condition, decreased knowledge of use of DME, decreased mobility, difficulty walking, decreased ROM, decreased strength, increased edema, obesity, pain, and lymphedema -related pain and chronic limb swelling.   ACTIVITY LIMITATIONS: Mobility and functional ambulation limitations:  abnormal gait pattern, carrying, lifting, bending, sitting, standing, squatting, stairs, transfers, and bed mobility Basic and instrumental ADLs (reaching feet and distal legs to groom nails, inspect skin, apply lotion, bathe lower body, difficulty with LB dressing, fitting shoes and socks, impaired sleeping, meal prep, standing to cook, driving, shopping, yard work, house work Paediatric nurse activities: work related activities requiring extended standing, walking, sitting; caring for others Social participation in the community, socializing with others, LE affects my body image completely, interferes w intimacy Leisure pursuits requiring extended standing, walking, sitting  PERSONAL FACTORS: Age, Past/current experiences, Time since onset of injury/illness/exacerbation, and 3+ comorbidities: lipedema, class III Obesity, OSA, arthritis are also affecting patient's functional outcome.   REHAB POTENTIAL:Pt has difficulty reaching feet, so requires assistance applying and removing multi-layer compression bandaging at home daily between OT visits to achieve  optimal limb volume reduction and functional outcomes. Without consistent, daily caregiver assistance with compression wrapping throughout CDT prognosis is poor. With daily assistance with wrapping, prognosis is fair due to BMI  and Lipedema co-morbidities   EVALUATION COMPLEXITY: Complex  GOALS: Goals  reviewed with patient? Yes  SHORT TERM GOALS: Target date: 4th OT Rx visit   Pt will demonstrate understanding of lymphedema precautions and prevention strategies with modified independence using a printed reference to identify at least 5 precautions and discussing how s/he may implement them into daily life to reduce risk of progression with extra time. Baseline:Max A Goal status: INITIAL  2.  Pt will be able to apply multilayer, knee length, gradient, compression wraps to one leg at a time from toes to below knee with Max caregiver A x 1 to decrease limb volume, to limit infection risk, and to limit lymphedema progression.  Baseline:  Dependent Goal status: INITIAL  LONG TERM GOALS: Target date: 08/08/23 1.Given this patient's Intake score of 57.35 % on the Lymphedema Life Impact Scale (LLIS), patient will experience a reduction of at least 10 points in her perceived level of functional impairment resulting from lymphedema to improve functional performance and quality of life (QOL). Baseline: 57.35 % Goal status: INITIAL  2.  Pt will achieve at least a 10% volume reduction in B legs to return limb to typical size and shape, to limit infection risk and LE progression, to decrease pain, to improve function. Baseline: Dependent Goal status: INITIAL  3.  Pt will obtain appropriate compression garments/devices and achieve modified independence (extra time + assistive devices) with donning/doffing to optimize limb volume reductions and limit LE progression over time. Baseline: Dependent Goal status: INITIAL  During Intensive phase CDT, with modified independence, Pt will achieve at least 85% compliance with all lymphedema self-care home program components, including daily skin care, compression wraps and /or garments, simple self MLD and lymphatic pumping therex to habituate LE self care protocol  into ADLs for optimal LE self-management over time. Baseline: Dependent Goal status:  INITIAL PLAN:  OT FREQUENCY: 2x/week and PRN  OT DURATION: other: 18 weeks and PRN  PLANNED INTERVENTIONS: Complete Decongestive Therapy (CDT): Manual Lymphatic Drainage (MLD) , Skin care, ther ex, gradient [id number]-Therapeutic exercises, 97530- Therapeutic activity, 97535- Self Care, [id number]- Manual therapy, Patient/Family education, Manual lymph drainage, Compression bandaging, DME instructions, and trial with advanced, sequential, pneumatic, compression device (Tactile Medical Flexitouch) Custom-made gradient compression garments and HOS devices are medically necessary because they are uniquely sized and shaped to fit the exact dimensions of the affected extremities, and to provide appropriate medical grade, graduated compression essential for optimally managing chronic, progressive lymphedema. Multiple custom compression garments are needed to ensure proper hygiene to limit infection risk. Custom compression garments should be replaced q 3-6 months When worn consistently for optimal lipo-lymphedema self-management over time. HOS devices, medically necessary to limit fibrosis buildup in tissue, should be replaced q 2 years and PRN when worn out.   PLAN FOR NEXT SESSION:  Continue MLD and compression wraps as established. Continue skin care during MLD to reduce infection risk.  Continue teaching lymphedema self-care  Zebedee Dec, MS, OTR/L, CLT-LANA 09/13/23 8:25 AM

## 2023-09-19 ENCOUNTER — Ambulatory Visit (INDEPENDENT_AMBULATORY_CARE_PROVIDER_SITE_OTHER): Admitting: Family Medicine

## 2023-09-19 ENCOUNTER — Encounter (INDEPENDENT_AMBULATORY_CARE_PROVIDER_SITE_OTHER): Payer: Self-pay | Admitting: Family Medicine

## 2023-09-19 ENCOUNTER — Ambulatory Visit (INDEPENDENT_AMBULATORY_CARE_PROVIDER_SITE_OTHER): Admitting: Neurology

## 2023-09-19 VITALS — BP 121/75 | HR 77 | Temp 98.0°F | Ht 64.0 in | Wt 342.0 lb

## 2023-09-19 DIAGNOSIS — Z8669 Personal history of other diseases of the nervous system and sense organs: Secondary | ICD-10-CM

## 2023-09-19 DIAGNOSIS — G4733 Obstructive sleep apnea (adult) (pediatric): Secondary | ICD-10-CM

## 2023-09-19 DIAGNOSIS — R419 Unspecified symptoms and signs involving cognitive functions and awareness: Secondary | ICD-10-CM

## 2023-09-19 DIAGNOSIS — Z820 Family history of epilepsy and other diseases of the nervous system: Secondary | ICD-10-CM

## 2023-09-19 DIAGNOSIS — F32A Depression, unspecified: Secondary | ICD-10-CM

## 2023-09-19 DIAGNOSIS — R251 Tremor, unspecified: Secondary | ICD-10-CM

## 2023-09-19 DIAGNOSIS — E1165 Type 2 diabetes mellitus with hyperglycemia: Secondary | ICD-10-CM | POA: Diagnosis not present

## 2023-09-19 DIAGNOSIS — Z6841 Body Mass Index (BMI) 40.0 and over, adult: Secondary | ICD-10-CM

## 2023-09-19 DIAGNOSIS — Z7985 Long-term (current) use of injectable non-insulin antidiabetic drugs: Secondary | ICD-10-CM

## 2023-09-19 DIAGNOSIS — F419 Anxiety disorder, unspecified: Secondary | ICD-10-CM | POA: Diagnosis not present

## 2023-09-19 DIAGNOSIS — E669 Obesity, unspecified: Secondary | ICD-10-CM | POA: Diagnosis not present

## 2023-09-19 MED ORDER — SEMAGLUTIDE (1 MG/DOSE) 4 MG/3ML ~~LOC~~ SOPN: 1.0000 mg | PEN_INJECTOR | SUBCUTANEOUS | 0 refills | Status: DC

## 2023-09-19 MED ORDER — SERTRALINE HCL 50 MG PO TABS: 75.0000 mg | ORAL_TABLET | Freq: Every day | ORAL | 0 refills | Status: DC

## 2023-09-19 NOTE — Progress Notes (Unsigned)
   SUBJECTIVE:  Chief Complaint: Obesity  Interim History: Over the last month she has tried a few new foods.  She found a new group on facebook and it has recipes for patients who have had bariatric surgery. She voices she fell into an old habit of sweets and did get this out of her system. She drinks quite a bit of diet soda. Met with her PT last Wednesday to try a different machine on her legs which led to a 5-6cm difference after using the new machine.  She isn't eating dinner everyday but she does eat dinner 3-4 times a week.  She eats lunch daily at 1pm.  Sometimes her lunch is delayed and that often leads to her skipping dinner.  Having a family and friends gathering at her house as part of a anniversary party at end of August.  Angel Watkins is here to discuss her progress with her obesity treatment plan. She is on the Category 2 Plan and states she is following her eating plan approximately 50 % of the time. She states she is not exercising much.   OBJECTIVE: Visit Diagnoses: Problem List Items Addressed This Visit   None   Vitals Temp: 98 F (36.7 C) BP: 121/75 Pulse Rate: 77 SpO2: 100 %   Anthropometric Measurements Height: 5' 4 (1.626 m) Weight: (!) 342 lb (155.1 kg) BMI (Calculated): 58.68 Weight at Last Visit: 337 lb Weight Gained Since Last Visit: 5 Starting Weight: 333 lb   No data recorded Other Clinical Data Today's Visit #: 45 Starting Date: 04/07/20 Comments: Cat 2     ASSESSMENT AND PLAN: Assessment & Plan Type 2 diabetes mellitus with hyperglycemia, without long-term current use of insulin  (HCC) Patient is tolerating 1 mg of Ozempic  weekly.  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  Will refill Ozempic  at current dose. Anxiety and depression Patient doing well on 75 mg of sertraline  daily.  No suicidal or homicidal ideation but  improvement in depressive and anxiety symptoms.  Needs refill today.  Patient was told if necessary she could increase to 100 mg daily. Obesity with starting BMI of 57.1  BMI 50.0-59.9, adult (HCC)    Diet: Angel Watkins is currently in the action stage of change. As such, her goal is to continue with weight loss efforts and has agreed to the Category 2 Plan.   Exercise:  For substantial health benefits, adults should do at least 150 minutes (2 hours and 30 minutes) a week of moderate-intensity, or 75 minutes (1 hour and 15 minutes) a week of vigorous-intensity aerobic physical activity, or an equivalent combination of moderate- and vigorous-intensity aerobic activity. Aerobic activity should be performed in episodes of at least 10 minutes, and preferably, it should be spread throughout the week.  Behavior Modification:  We discussed the following Behavioral Modification Strategies today: increasing lean protein intake, decreasing simple carbohydrates, increasing vegetables, and meal planning and cooking strategies.   No follow-ups on file.   She was informed of the importance of frequent follow up visits to maximize her success with intensive lifestyle modifications for her multiple health conditions.  Attestation Statements:   Reviewed by clinician on day of visit: allergies, medications, problem list, medical history, surgical history, family history, social history, and previous encounter notes.    Angel Cho, MD

## 2023-09-20 ENCOUNTER — Ambulatory Visit: Admitting: Occupational Therapy

## 2023-09-20 NOTE — Assessment & Plan Note (Signed)
 Patient is tolerating 1 mg of Ozempic  weekly.  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  Will refill Ozempic  at current dose.

## 2023-09-20 NOTE — Assessment & Plan Note (Signed)
 Patient doing well on 75 mg of sertraline  daily.  No suicidal or homicidal ideation but improvement in depressive and anxiety symptoms.  Needs refill today.  Patient was told if necessary she could increase to 100 mg daily.

## 2023-09-21 ENCOUNTER — Other Ambulatory Visit (HOSPITAL_COMMUNITY): Payer: Self-pay | Admitting: Neurosurgery

## 2023-09-21 DIAGNOSIS — M47816 Spondylosis without myelopathy or radiculopathy, lumbar region: Secondary | ICD-10-CM

## 2023-09-21 DIAGNOSIS — G959 Disease of spinal cord, unspecified: Secondary | ICD-10-CM

## 2023-09-26 ENCOUNTER — Ambulatory Visit: Payer: Self-pay | Admitting: Neurology

## 2023-09-26 NOTE — Procedures (Signed)
 Physician Interpretation: Please see link under Procedure Tab or under Encounters tab for physician report, technical report, as well as O2 titration and/or PAP titration tables (if applicable).            I certify that I have reviewed the entire raw data recording prior to the issuance of this report in accordance with the Standards of Accreditation of the [employer] Academy of Sleep [employer] (AASM).   True Mar, MD, PhD Medical Director, [facility]) Diplomat, ABPN (Neurology and Sleep)

## 2023-09-27 ENCOUNTER — Ambulatory Visit: Admitting: Occupational Therapy

## 2023-09-27 DIAGNOSIS — I890 Lymphedema, not elsewhere classified: Secondary | ICD-10-CM

## 2023-09-27 NOTE — Therapy (Signed)
 OUTPATIENT OCCUPATIONAL THERAPY TREATMENT   BILATERAL LOWER EXTREMITY/ BILATERAL LOWER QUADRANT LIPO-LYMPHEDEMA  Patient Name: Angel Watkins MRN: [id number] DOB:October 12, 1960, 63 y.o., female Today's Date: 09/27/2023  END OF SESSION:   OT End of Session - 09/27/23 0802     Visit Number 9    Number of Visits 36    Date for OT Re-Evaluation 10/10/23    OT Start Time 0800    OT Stop Time 0900    OT Time Calculation (min) 60 min    Activity Tolerance Patient tolerated treatment well;No increased pain    Behavior During Therapy WFL for tasks assessed/performed            Past Medical History:  Diagnosis Date   Anxiety    Arthritis    Back pain    Bilateral swelling of feet    Bulging lumbar disc    and cervical   Diabetes mellitus without complication (HCC)    Fibromyalgia    Hypertension    Joint pain    Lymphedema    Palpitations    Sleep apnea    Spinal stenosis    Wears glasses    Past Surgical History:  Procedure Laterality Date   ABDOMINAL HYSTERECTOMY     ANAL FISSURE REPAIR     ANTERIOR CERVICAL DECOMP/DISCECTOMY FUSION N/A 12/03/2018   Procedure: Cervical five-six  Anterior cervical decompression/discectomy/fusion;  Surgeon: Cheryle Debby LABOR, MD;  Location: [facility] OR;  Service: Neurosurgery;  Laterality: N/A;   BUNIONECTOMY Left 2009   GASTRIC BYPASS     LUMBAR LAMINECTOMY     RADIOLOGY WITH ANESTHESIA N/A 12/14/2017   Procedure: MRI WITH ANESTHESIA, LUMBAR SPINE WITHOUT CONTRAST, CERVICAL SPINE WITHOUT CONTRAST;  Surgeon: Radiologist, Medication, MD;  Location: [facility] OR;  Service: Radiology;  Laterality: N/A;   RADIOLOGY WITH ANESTHESIA N/A 10/08/2019   Procedure: MRI WITH ANESTHESIA    LUMBAR WITHOUT;  Surgeon: Radiologist, Medication, MD;  Location: [facility] OR;  Service: Radiology;  Laterality: N/A;   right shoulder total replacement     Patient Active Problem List   Diagnosis Date Noted   Morbid obesity (HCC) 07/14/2023   Hyperlipidemia associated with  type 2 diabetes mellitus (HCC) 05/29/2023   SOBOE (shortness of breath on exertion) 05/24/2023   Lymphedema 03/08/2023   Anxiety and depression 12/06/2022   Eating disorder 12/02/2020   Vitamin D  deficiency 12/02/2020   Type 2 diabetes mellitus with hyperglycemia, without long-term current use of insulin  (HCC) 07/20/2020   Hypertension associated with diabetes (HCC) 07/20/2020   Class 3 severe obesity with serious comorbidity and body mass index (BMI) of 50.0 to 59.9 in adult 07/20/2020   Cervical radiculopathy 12/03/2018    PCP: Gerard Roys, PA ([facility], [place], [place])  REFERRING PROVIDER: Toribio Kerry, MD  REFERRING DIAG: I89.0  THERAPY DIAG:  Lymphedema, not elsewhere classified  Rationale for Evaluation and Treatment: Rehabilitation  ONSET DATE: 2002 s/p gastric bypass.  SUBJECTIVE:  SUBJECTIVE STATEMENT:  Pt presents to OT for lymphedema care. She was last seen on 09/13/23. Pt reports she continues to have difficulty with morning time constraints  and being able to apply wraps before leaving for work. Consequently compression wrapping continues to be somewhat inconsistent, and Pt expresses interest in obtaining adjustable alternatives to compression wraps.   (09/13/23 FLEXITOUCH TRIAL : Angel Watkins returns to OT to continue Lymphedema treatment to BLE. Pt is accompanied by her husband, Angel Watkins, today. Angel Watkins has been wrapping her and it's helping.  Pt reports 3/10 pins and needles in bilateral thighs. Manufacturer's rep from [employer] due here this morning to assist with a trial of the advanced Flexitouch sequential pneumatic compression device. Mrs Dower commenced OT for Complete Decongestive Therapy (CDT)  on 07/12/23. Pt has been using the [employer] basic NIMBL sequential  pneumatic compression device, or pump, daily for over a month without significant improvement in her condition. The advanced device is medically necessary to treat swelling that extends proximally above the thighs into the hips, buttocks and abdomen that the basic device does not cover.)  (07/12/23 OT Initial Eval: Angel Watkins is referred to Occupational Therapy by Toribio Kerry, MD at [facility] for evaluation and treatment of BLE/BLQ Lipo-lymphedema.Pt reports she first noticed leg swelling after gastric bypass surgery. She states, I lost 100 pounds, but my legs just got bigger instead of going down. Pt reports lower extremity and lower quadrant ( buttocks, abdomen, hips) worsened continues to worsen over time. She is unable to fit off the shelf compression stockings. Diuretics, prescribed in the past, do not reduce swelling. Pt describes lower extremity/ lower quadrant pain and discomfort as heaviness. She rates  this painful sensation as 6-7/10. Pt reports her mother had some symptoms of lipedema, but she is unaware of other relatives that may have had lymphedema or lipedema. Pt denies hx of cellulitis and blood clots. Pt reports her mother had the same body type with a small waist and large, fatty hips and legs.  )  PERTINENT HISTORY:   Anxiety    Arthritis    Back pain    Bilateral swelling of feet    Bulging lumbar disc    and cervical   Diabetes mellitus without complication (HCC)    Fibromyalgia    Hypertension    Joint pain    Palpitations    Sleep apnea    Spinal stenosis   Gastric bypass 2002   Class 3 severe obesity with serious comorbidity and body mass index (BMI) of 50.0 to 59.9 in adult (BMI measured this date 55.6)   Cervical radiculopathy    PAIN:  Are you having pain?  Yes, 3/10 pins and needs, R>L thigh  PRECAUTIONS: Other: LYMPHEDEMA PRECAUTIONS  WEIGHT BEARING RESTRICTIONS: No  FALLS:  Has patient fallen in last 6 months? Yes. Number of falls 1; Just  got a new scooter. It caught my shirt and pulled me down. But I wasn't hurt.  LIVING ENVIRONMENT: Lives with: lives with their spouse Lives in: House/apartment Stairs: Yes; External: 3 steps; can reach both Has following equipment at home: Single point cane, shower chair, and hand held shower  OCCUPATION: Advertising account executive full time  LEISURE: enjoys visiting vineyards  HAND DOMINANCE: right   PRIOR LEVEL OF FUNCTION: Independent  PATIENT GOALS: 1. Get swelling under control     2. Limit progression  OBJECTIVE: Note: Objective measures were completed at Evaluation unless otherwise noted.  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 Non cancer related  INFECTIONS: Denies hx of cellulitis  WOUNDS: Denies hx of leg wounds   BLE COMPARATIVE LIMB VOLUMETRICS : Initial 07/26/23  LANDMARK RIGHT  (dominant)  R LEG (A-D) 7303.9 ml  R THIGH (E-G) ml  R FULL LIMB (A-G) ml  Limb Volume differential (LVD)  LVD measures 1.51 % , L>R  Volume change since last measured %  Volume change since initial V  (Blank rows = not tested)  LANDMARK LEFT   L LEG (A-D) 7193.7 ml  L THIGH (E-G) ml  L FULL LIMB (A-G) ml  Limb Volume differential (LVD)  %  Volume change since initial %  Volume change overall %  (Blank rows = not tested)    Moderate-Severe, Stage  II, Bilateral Lower Extremity Lipo- Lymphedema 2/2  Severe, Type III Lipedema involving buttocks, abdomen and ankles  Skin  Description Hyper-Keratosis Peau d' Orange Hyperplasia Tight Fibrotic/ Indurated Fatty Doughy Spongy/ boggy    x x  x x x x   Skin dry Flaky  WNL Macerated   mild      Color Redness Varicosities Blanching Hemosiderin Stain Mottled    x    x   Odor Malodorous Yeast Fungal infection  WNL      x   Temperature Warm Cool wnl    x     Pitting Edema   1+ 2+ 3+ 4+ Non-pitting         x   Girth Symmetrical Asymmetrical                   Distribution   x  Toes to groin, abdomen, buttocks    Stemmer Sign Positive Negative   +    Lymphorrhea History Of:  Present Absent     x    Wounds History Of Present Absent Venous Arterial Pressure Sheer     x        Signs of Infection Redness Warmth Erythema Acute Swelling Drainage Borders                    Sensation Light Touch Deep pressure Hypersensitivity   Impaired In tact Impaired In tact Absent Impaired    x  x  x    Nails WNL   Fungus nail dystrophy   x     Hair Growth Symmetrical Asymmetrical   x    Skin Creases Base of toes  Ankles   Base of Fingers knees       Abdominal pannus Thigh Lobules  Face/neck    x  x x x     GAIT: Distance walked: >500' Assistive device utilized: Single point cane Level of assistance: Modified independence Comments: extra time needed  LYMPHEDEMA LIFE IMPACT SCALE (LLIS): 57.35% (The extent to which lymphedema related problems affected your life during the past week.)  TREATMENT THIS DATE:  Pt/family edu for LE self-care: CircAid alternative compression leggings Anatomical measurements for CircAids Multilayer gradient compression bandages- knee length on R  PATIENT EDUCATION:  Continued Pt/ CG edu for lymphedema self care home program throughout session. Topics include outcome of comparative limb volumetrics- starting limb volume differentials (LVDs), technology and gradient techniques used for short stretch, multilayer compression wrapping, simple self-MLD, therapeutic lymphatic pumping exercises,  skin/nail care, LE precautions, compression garment recommendations and specifications, wear and care schedule and compression garment donning / doffing w assistive devices. Discussed progress towards all OT goals since commencing CDT. Discussed detrimental impact of obesity on lower and upper extremity lymphedema over time. Reviewed OT goals for lymphedema care with Pt and discussed progress to date.   Provided Pt education re precautions related to use of advanced sequential pneumatic compression device. Pt verbalized understanding that she should never use device on 2 arms/legs simultaneously and should not use device 2 x on same day to avoid overloading her heart with fluid volume return both at present, and in years to come should her medical condition change. Pt instructed to remove device immediately should she experience atypical SOP, light headedness, or acute pain. Pt instructed to discontinue pump if she suspects, or has any infection, blood clot, cellulitis, the flu, corona virus, etc.  All questions answered to the Pt's satisfaction. Good return. Person educated: Patient and spouse Education method: Explanation, Demonstration, and Handouts Education comprehension: verbalized understanding, returned demonstration, verbal cues required, and needs further education   LYMPHEDEMA SELF-CARE HOME PROGRAM:  BLE lymphatic pumping there ex using- 1 sets of 10 reps, each exercise in order-  1-2 x daily, bilaterally Simple self MLD 1 x daily  MLD utilizing short neck sequence, deep diaphragmatic breathing, stationary J strokes to functional inguinal LN, then proximal dynamic  J strokes to thigh, knee, bottleneck region, leg and foot. Sequences were repeated in reverse x distal to proximally x 3 to complete manual therapy. Daily skin care to increase hydration, skin mobility and decrease infection risk- can be done during MLD Intensive Phase CDT: Compression wraps 23/7 until garment fitting  complete Self-management Phase CDT: Custom-made gradient compression garments and HOS devices are medically necessary because they are uniquely sized and shaped to fit the exact dimensions of the affected extremities, and to provide appropriate medical grade, graduated compression essential for optimally managing chronic, progressive lymphedema. Multiple custom compression garments are needed to ensure proper hygiene to limit infection risk. Custom compression garments should be replaced q 3-6 months When worn consistently for optimal lipo-lymphedema self-management over time. HOS devices, medically necessary to limit fibrosis buildup in tissue, should be replaced q 2 years and PRN when worn out.    Daily   ASSESSMENT:  CLINICAL IMPRESSION:    Pt continues to struggle with getting assistance with compression wrapping daily before going to work. Consequently compression wrapping has been somewhat inconsistent . We shifted gears away from ultimately fitting Pt with a traditional elastic compression garment, which I suspect will require assistance in her case, to fitting her with the adjustable, CircAid, Velcro-style alternative compression leggings. Pt educated on pros and cons of CircAids vs traditional compression garments, as well as various features and options.  OT demonstrated a sample garment which Pt is able to don with minimal assistance and much more quickly to get the distal end started. OT completed anatomical measurements bilaterally, and Pt does fit into an off the shelf size.   Pt should be fit with adjustable compression  garment alternative, [employer] CircAid Juxtafit Essentials, knee length bilaterally, size XL-WIDE CALF-LONG are medically necessary to provide accessible compression to control lipo-lymphedema of the legs over time,  that are easy to doff and don, and provide a built in gauging system allowing for graduated , adjustable compression.     (09/13/23  [facility] TRIAL: Angel Watkins presents with moderate-severe, stage II, BLE Lipo-lymphedema involving bilateral lower extremities, hips, buttocks and abdomen. Pt has undergone OT for CDT since 07/12/23. Pt has used the [employer] basic NIMBL sequential pneumatic compression device, or pump, for more than 4 weeks with persistent chronic, progressive swelling and associated pain. The basic pump has failed to control swelling and reduce pain. An advanced Flexitouch device is medically necessary to decongest the lower quadrant hips, buttocks and abdomen to facilitate lymphatic decongestion out of the limbs and through proximal pathways above the groin to the thoracic duct.   Moderate-Severe, Stage  II, Bilateral Lower Extremity Lipo- Lymphedema 2/2  Severe, Type III Lipedema involving BLE, buttocks, abdomen and hips DIAGNOSIS: [x]  Lymphedema, not elsewhere classified [I89.0]  []  Hereditary lymphedema [Q82.0]  []  Postmastectomy lymphedema [I97.2]   []  Chronic Venous Stasis ulcers [I87.2] (non-healing despite 6 months of ongoing treatment)  SWELLING SEVERITY:  International Society of Lymphology clinical classification: []  Stage 0: Latent or subclinical condition where swelling is not yet evident despite impaired lymph transport, subtle alterations in tissue fluid/composition and changes in subjective symptoms.  It may exist months or years before overt edema occurs.  []  Stage I: Early accumulation of fluid relatively high in protein content (e.g., in comparison with "venous" edema) which subsides with limb elevation.  Pitting may occur.  An increase in various types of proliferating cells may also be seen.  [x]  Stage II: Limb elevation alone rarely reduces the tissue swelling and pitting is manifest.  Later in Stage II, the limb may not pit as excess subcutaneous fat and fibrosis develop.  []  Stage III: Lymphostatic elephantiasis where pitting can be absent and trophic skin changes such as acanthosis, alterations in  skin character and thickness, further deposition of fat and fibrosis, and warty overgrowths have developed.   SYMPTOMS: Patient exhibits the following symptoms despite conservative therapy: []  Hyperkeratosis  [x]  Hyperplasia  [x]  Hyperpigmentation  []  Skin breakdown with lymphorrhea (skin weeping)  []  Papillomatosis  []  Recurrent cellulitis  [x]  Fibrosis   []  Elephantiasis [x]  Progressive edema [x]  Truncal/abdominal swelling []  Chest/axillary swelling []  Genital swelling [x]  Unable to control swelling [x]  Impaired ROM [x]  Impaired mobility [x]  Pain []  Scarring CONSERVATIVE TREATMENT: Patient has tried conservative treatments:  [x]  Yes []  No  If YES, specify type of conservative treatment and duration of use: Compression garments:    []  <4 weeks   []  1-5 months   []  6-12 months  []  >1 year Bandaging:     []  <4 weeks   [x]  1-5 months   []  6-12 months  []  >1 year Elevation:       []  <4 weeks   [x]  1-5 months   []  6-12 months  []  >1 year  Exercise:         []  <4 weeks   [x]  1-5 months   []  6-12 months  []  >1 year  Complete Decongestive Therapy (CDT) []  <4 weeks   [x]  1-5 months   []  6-12 months  []  >1 year  Instructed on self-manual lymphatic drainage techniques (self-MLD): [x]  Yes []  No Instructed on appropriate skin/nail care practices:    [x]  Yes []   No Evaluation of diet and medications:      [x]  Yes []  No  PNEUMATIC COMPRESSION DEVICE EVALUATION: Patient has tried an Z9348 basic pneumatic compression device?  [x]  Yes []  No  If YES: Z9348 basic pneumatic compression device [employer] NIMBL was used at 30-400 mmHg for:  []  <4 weeks   [x]  1-5 months   []  6-12 months  []  >1 year  []  Discontinued use prior to 4 weeks due to negative symptoms Was the E0651 basic pneumatic compression device effective in managing the patient's lymphedema?  []  Yes []  No   If NO, an [id number] was not used, the following symptoms demonstrate why it was ineffective or deemed not clinically appropriate to  manage the patient's lymphedema: []  E0651 basic pneumatic compression device caused or exacerbated swelling proximal to pump sleeve (top of thigh, genitals, abdomen, hips, buttocks, chest, etc.)  []  Z9348 basic pneumatic compression device was unable to accommodate limb size  []  Patient unable to tolerate or do not recommend E0651 basic pneumatic compression device due to pain/compromised skin integrity (e.g., open wounds/ulcers, scarring, sensitivity)  [x]  Z9348 basic pneumatic compression device did not control swelling or result in clinical improvement  Is an E0652 advanced pneumatic compression device medically necessary to treat the clinical symptoms listed above that prevent effective treatment with the Z9348 basic pneumatic compression device? [x]  Yes []  No   TREATMENT PLAN: Patient to complete or continue the following treatment(s):  Compression Garments and/or Bandaging   Yes [x]  No Exercise       [x]  Yes []  No Elevation      [x]  Yes []  No E0651 Basic Pneumatic Compression Device   []  Yes [x]  No E0652 Advanced Pneumatic Compression Device  [x]  Yes []  No Professional Lymphedema Treatment (CDT)  [x]  Yes []  No Self-MLD       [x]  Yes []  No   MEASUREMENTS: NIMBL Pre and post trial circumferential measurements reveal increases in circumferences at all anatomical landmarks over time. These data demonstrate a failure of the NIMBL basic sequential pneumatic device for lymphedema control.   Lower extremity measurements (measure the largest circumferential point of the listed area):  Date: 04/20/23 NIMBL Pre trial  Date: 04/20/23 NIMBL Pre trial  [x]   Right Leg  Measurements   [x]   Left Leg Measurements  Inseam 63 cm  Inseam 63 cm  Thigh 87 cm  Thigh 84 cm  Knee  58 cm  Knee 61 cm  Calf 47 cm  Calf 48 cm  Ankle 27 cm  Ankle 28 cm  Hips 160 cm  Hips _________cm  Abdomen/Trunk _________cm  Abdomen/Trunk _________cm    Date:09/13/23 NIMBL Post trial  Date:09/13/23  NIMBL Post trial    [x]   Right Leg  Measurements   [x]   Left Leg Measurements    Inseam _________cm  Inseam _________cm   Thigh 93.5 cm  Thigh 96 cm   Knee 60.5 cm  Knee 61.5 cm   Calf 60 cm  Calf  61 cm   Ankle 34 cm  Ankle 35 cm   Hips 168 cm  Hips _________cm   Abdomen _________cm  Abdomen _________cm     Clinician Signature: TG  (07/12/23 Initial OT eval: Angel Watkins is a 63 yo female presenting with moderate to sever, stage II, lipo-lymphedema 2/2 to Type III LIPEDEMA, Class III Obesity, and suspected venous insufficiency. Her condition involves buttocks, abdomen and bilateral lower extremities. It is exacerbated by joint inflammation, obstructive sleep apnea and hypertension. This patient has a  complex constellation of contributing factors.  Lipedema typically affects women and is characterized by painful, symmetrical, excessive deposition of fat below the waist in the legs, thighs and buttocks resulting in a disproportionate waist to hip ratio. It may also include the arms. Unlike lymphedema where swelling extends to the feet and presents as asymmetrical, the feet are spared and cuff-like accumulation of thickened, fibrotic, fatty tissue is noted at distal legs. Accompanying class III obesity favors the development of lipo-lymphedema, and/ or obesity-induced lymphedema. As lipedema progresses fatty fibrosis accumulates in the subcutis putting pressure on delicate lymphatics resulting in excessive lymph accumulation and progressive lymphedema. The combined condition of lipo-lymphedema is difficult to treat as lipedema does not typically respond well to Complete Decongestive Therapy (CDT), while lymphedema may respond to varying degrees. Well fitting, comfortable compression garments that do not constrict or roll down often provide pain relief and enable improved functional performance. An advanced, sequential, pneumatic , compression device, The Flexitouch from [employer], may reduce pain and  discomfort while helping to reduce lymphatic congestion by incorporating anatomically correct lymphatic channels into the device garments.  Extreme obesity can cause lower extremity lymphedema, termed obesity-induced lymphedema (OIL). Obesity-induced lymphedema is secondary lymphedema that may occur once an individual's body mass index (BMI) exceeds 40. Angel Watkins's BMI measures 55.6 today. The risk of lymphatic dysfunction increases with elevated BMI and is almost universal once BMI exceeds 60. Patients with obesity-induced lymphedema also may develop areas of massive, localized lymphedema. Individuals with OIL are in an unfavorable cycle of weight gain and lymphatic injury. As BMI increases lymphedema worsens, ambulation becomes more difficult, and BMI further rises. The fundamental treatment for OIL is weight loss.   Angel Watkins will benefit from skilled Occupational Therapy to reduce limb swelling and associated pain, to limit progression and infection risk. BLE lymphedema limits functional performance in all occupational domains, including functional mobility and ambulation,  basic and instrumental ADLs, productive activities, leisure pursuits, social participation and quality of life. This patient will benefit from modified Intensive and Self Management Phase CDT . In addition to MLD, ther ex, skin care and compression bandaging below the knees, Pt will be fitted with custom knee length compression stockings paired with off the shelf , Capri length compression leggings. If insurance benefits allow, she will undergo a trial in the clinic with the advanced Flexitouch device in an effort to reduce discomfort and reduce infection risk. Without skilled OT Li-lymphedema will worsen over time and further functional decline is expected. )   OBJECTIVE IMPAIRMENTS: Abnormal gait, decreased balance, decreased knowledge of condition, decreased knowledge of use of DME, decreased mobility, difficulty  walking, decreased ROM, decreased strength, increased edema, obesity, pain, and lymphedema -related pain and chronic limb swelling.   ACTIVITY LIMITATIONS: Mobility and functional ambulation limitations:  abnormal gait pattern, carrying, lifting, bending, sitting, standing, squatting, stairs, transfers, and bed mobility Basic and instrumental ADLs (reaching feet and distal legs to groom nails, inspect skin, apply lotion, bathe lower body, difficulty with LB dressing, fitting shoes and socks, impaired sleeping, meal prep, standing to cook, driving, shopping, yard work, house work Paediatric nurse activities: work related activities requiring extended standing, walking, sitting; caring for others Social participation in the community, socializing with others, LE affects my body image completely, interferes w intimacy Leisure pursuits requiring extended standing, walking, sitting  PERSONAL FACTORS: Age, Past/current experiences, Time since onset of injury/illness/exacerbation, and 3+ comorbidities: lipedema, class III Obesity, OSA, arthritis are also affecting patient's functional outcome.   REHAB  POTENTIAL:Pt has difficulty reaching feet, so requires assistance applying and removing multi-layer compression bandaging at home daily between OT visits to achieve  optimal limb volume reduction and functional outcomes. Without consistent, daily caregiver assistance with compression wrapping throughout CDT prognosis is poor. With daily assistance with wrapping, prognosis is fair due to BMI  and Lipedema co-morbidities   EVALUATION COMPLEXITY: Complex  GOALS: Goals reviewed with patient? Yes  SHORT TERM GOALS: Target date: 4th OT Rx visit   Pt will demonstrate understanding of lymphedema precautions and prevention strategies with modified independence using a printed reference to identify at least 5 precautions and discussing how s/he may implement them into daily life to reduce risk of progression with extra  time. Baseline:Max A Goal status: INITIAL  2.  Pt will be able to apply multilayer, knee length, gradient, compression wraps to one leg at a time from toes to below knee with Max caregiver A x 1 to decrease limb volume, to limit infection risk, and to limit lymphedema progression.  Baseline: Dependent Goal status: INITIAL  LONG TERM GOALS: Target date: 08/08/23 1.Given this patient's Intake score of 57.35 % on the Lymphedema Life Impact Scale (LLIS), patient will experience a reduction of at least 10 points in her perceived level of functional impairment resulting from lymphedema to improve functional performance and quality of life (QOL). Baseline: 57.35 % Goal status: INITIAL  2.  Pt will achieve at least a 10% volume reduction in B legs to return limb to typical size and shape, to limit infection risk and LE progression, to decrease pain, to improve function. Baseline: Dependent Goal status: INITIAL  3.  Pt will obtain appropriate compression garments/devices and achieve modified independence (extra time + assistive devices) with donning/doffing to optimize limb volume reductions and limit LE progression over time. Baseline: Dependent Goal status: INITIAL  During Intensive phase CDT, with modified independence, Pt will achieve at least 85% compliance with all lymphedema self-care home program components, including daily skin care, compression wraps and /or garments, simple self MLD and lymphatic pumping therex to habituate LE self care protocol  into ADLs for optimal LE self-management over time. Baseline: Dependent Goal status: INITIAL PLAN:  OT FREQUENCY: 2x/week and PRN  OT DURATION: other: 18 weeks and PRN  PLANNED INTERVENTIONS: Complete Decongestive Therapy (CDT): Manual Lymphatic Drainage (MLD) , Skin care, ther ex, gradient [id number]-Therapeutic exercises, 97530- Therapeutic activity, 97535- Self Care, [id number]- Manual therapy, Patient/Family education, Manual lymph  drainage, Compression bandaging, DME instructions, and trial with advanced, sequential, pneumatic, compression device (Tactile Medical Flexitouch) Custom-made gradient compression garments and HOS devices are medically necessary because they are uniquely sized and shaped to fit the exact dimensions of the affected extremities, and to provide appropriate medical grade, graduated compression essential for optimally managing chronic, progressive lymphedema. Multiple custom compression garments are needed to ensure proper hygiene to limit infection risk. Custom compression garments should be replaced q 3-6 months When worn consistently for optimal lipo-lymphedema self-management over time. HOS devices, medically necessary to limit fibrosis buildup in tissue, should be replaced q 2 years and PRN when worn out.   PLAN FOR NEXT SESSION:  Continue MLD and compression wraps as established. Continue skin care during MLD to reduce infection risk.  Continue teaching lymphedema self-care  Angel Dec, Angel, OTR/L, CLT-LANA 09/27/23 10:33 AM

## 2023-09-28 ENCOUNTER — Telehealth: Payer: Self-pay | Admitting: Neurology

## 2023-09-28 NOTE — Telephone Encounter (Signed)
 She is schedule for anesthesia at [facility] on 10/9. MRI brain [employer] no auth required Case Wlfazm:[id number]

## 2023-10-04 ENCOUNTER — Ambulatory Visit: Attending: Physician Assistant | Admitting: Occupational Therapy

## 2023-10-04 DIAGNOSIS — I890 Lymphedema, not elsewhere classified: Secondary | ICD-10-CM | POA: Diagnosis present

## 2023-10-04 NOTE — Therapy (Signed)
 OUTPATIENT OCCUPATIONAL THERAPY TREATMENT NOTE AND PROGRESS REPORT  BILATERAL LOWER EXTREMITY/ BILATERAL LOWER QUADRANT LIPO-LYMPHEDEMA  Patient Name: Angel Watkins MRN: [id number] DOB:Mar 29, 1960, 63 y.o., female Today's Date: 10/04/2023  REPORTING PERIOD 07/12/23 - 10/04/23  END OF SESSION:   OT End of Session - 10/04/23 0808     Visit Number 10    Number of Visits 36    Date for OT Re-Evaluation 10/10/23    OT Start Time 0800    OT Stop Time 0901    OT Time Calculation (min) 61 min    Activity Tolerance Patient tolerated treatment well;No increased pain    Behavior During Therapy WFL for tasks assessed/performed            Past Medical History:  Diagnosis Date   Anxiety    Arthritis    Back pain    Bilateral swelling of feet    Bulging lumbar disc    and cervical   Diabetes mellitus without complication (HCC)    Fibromyalgia    Hypertension    Joint pain    Lymphedema    Palpitations    Sleep apnea    Spinal stenosis    Wears glasses    Past Surgical History:  Procedure Laterality Date   ABDOMINAL HYSTERECTOMY     ANAL FISSURE REPAIR     ANTERIOR CERVICAL DECOMP/DISCECTOMY FUSION N/A 12/03/2018   Procedure: Cervical five-six  Anterior cervical decompression/discectomy/fusion;  Surgeon: Cheryle Debby LABOR, MD;  Location: [facility] OR;  Service: Neurosurgery;  Laterality: N/A;   BUNIONECTOMY Left 2009   GASTRIC BYPASS     LUMBAR LAMINECTOMY     RADIOLOGY WITH ANESTHESIA N/A 12/14/2017   Procedure: MRI WITH ANESTHESIA, LUMBAR SPINE WITHOUT CONTRAST, CERVICAL SPINE WITHOUT CONTRAST;  Surgeon: Radiologist, Medication, MD;  Location: [facility] OR;  Service: Radiology;  Laterality: N/A;   RADIOLOGY WITH ANESTHESIA N/A 10/08/2019   Procedure: MRI WITH ANESTHESIA    LUMBAR WITHOUT;  Surgeon: Radiologist, Medication, MD;  Location: [facility] OR;  Service: Radiology;  Laterality: N/A;   right shoulder total replacement     Patient Active Problem List   Diagnosis Date Noted    Morbid obesity (HCC) 07/14/2023   Hyperlipidemia associated with type 2 diabetes mellitus (HCC) 05/29/2023   SOBOE (shortness of breath on exertion) 05/24/2023   Lymphedema 03/08/2023   Anxiety and depression 12/06/2022   Eating disorder 12/02/2020   Vitamin D  deficiency 12/02/2020   Type 2 diabetes mellitus with hyperglycemia, without long-term current use of insulin  (HCC) 07/20/2020   Hypertension associated with diabetes (HCC) 07/20/2020   Class 3 severe obesity with serious comorbidity and body mass index (BMI) of 50.0 to 59.9 in adult 07/20/2020   Cervical radiculopathy 12/03/2018    PCP: Gerard Roys, PA ([facility], [place], [place])  REFERRING PROVIDER: Toribio Kerry, MD  REFERRING DIAG: I89.0  THERAPY DIAG:  Lymphedema, not elsewhere classified  Rationale for Evaluation and Treatment: Rehabilitation  ONSET DATE: 2002 s/p gastric bypass.  SUBJECTIVE:  SUBJECTIVE STATEMENT:  Pt presents to OT for lymphedema care. Pt is unaccompanied this morning. Pt reports 5/10 R knee pain. Pt reports she continues to have difficulty with morning time constraints and being able to apply wraps before leaving for work. Consequently compression wrapping continues to be somewhat inconsistent. Pt believes adjustable compression leggings ordered last session will assist with more consistent compression.  Pt reports advanced Flexitouch device arrived yesterday and a trainer from [employer] is scheduled to come assist her with set up on Sept 17th.  (09/13/23 FLEXITOUCH TRIAL : Renesmae returns to OT to continue Lymphedema treatment to BLE. Pt is accompanied by her husband, [name], today. [name] has been wrapping her and it's helping.  Pt reports 3/10 pins and needles in bilateral thighs. Manufacturer's rep from  [employer] due here this morning to assist with a trial of the advanced Flexitouch sequential pneumatic compression device. Angel Watkins commenced OT for Complete Decongestive Therapy (CDT)  on 07/12/23. Pt has been using the [employer] basic NIMBL sequential pneumatic compression device, or pump, daily for over a month without significant improvement in her condition. The advanced device is medically necessary to treat swelling that extends proximally above the thighs into the hips, buttocks and abdomen that the basic device does not cover.)  (07/12/23 OT Initial Eval: Angel Watkins is referred to Occupational Therapy by Toribio Kerry, MD at [facility] for evaluation and treatment of BLE/BLQ Lipo-lymphedema.Pt reports she first noticed leg swelling after gastric bypass surgery. She states, I lost 100 pounds, but my legs just got bigger instead of going down. Pt reports lower extremity and lower quadrant ( buttocks, abdomen, hips) worsened continues to worsen over time. She is unable to fit off the shelf compression stockings. Diuretics, prescribed in the past, do not reduce swelling. Pt describes lower extremity/ lower quadrant pain and discomfort as heaviness. She rates  this painful sensation as 6-7/10. Pt reports her mother had some symptoms of lipedema, but she is unaware of other relatives that may have had lymphedema or lipedema. Pt denies hx of cellulitis and blood clots. Pt reports her mother had the same body type with a small waist and large, fatty hips and legs.  )  PERTINENT HISTORY:   Anxiety    Arthritis    Back pain    Bilateral swelling of feet    Bulging lumbar disc    and cervical   Diabetes mellitus without complication (HCC)    Fibromyalgia    Hypertension    Joint pain    Palpitations    Sleep apnea    Spinal stenosis   Gastric bypass 2002   Class 3 severe obesity with serious comorbidity and body mass index (BMI) of 50.0 to 59.9 in adult (BMI measured  this date 55.6)   Cervical radiculopathy    PAIN:  Are you having pain?  Yes, 5/10 pins and needs, R>L knee  PRECAUTIONS: Other: LYMPHEDEMA PRECAUTIONS  WEIGHT BEARING RESTRICTIONS: No  FALLS:  Has patient fallen in last 6 months? Yes. Number of falls 1; Just got a new scooter. It caught my shirt and pulled me down. But I wasn't hurt.  LIVING ENVIRONMENT: Lives with: lives with their spouse Lives in: House/apartment Stairs: Yes; External: 3 steps; can reach both Has following equipment at home: Single point cane, shower chair, and hand held shower  OCCUPATION: Advertising account executive full time  LEISURE: enjoys visiting vineyards  HAND DOMINANCE: right   PRIOR LEVEL OF FUNCTION: Independent  PATIENT GOALS: 1.  Get swelling under control     2. Limit progression  OBJECTIVE: Note: Objective measures were completed at Evaluation unless otherwise noted.  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 Non cancer related  INFECTIONS: Denies hx of cellulitis  WOUNDS: Denies hx of leg wounds   BLE COMPARATIVE LIMB VOLUMETRICS : Initial 07/26/23  LANDMARK RIGHT  (dominant)  R LEG (A-D) 7303.9 ml  R THIGH (E-G) ml  R FULL LIMB (A-G) ml  Limb Volume differential (LVD)  LVD measures 1.51 % , L>R  Volume change since last measured %  Volume change since initial V  (Blank rows = not tested)  LANDMARK LEFT   L LEG (A-D) 7193.7 ml  L THIGH (E-G) ml  L FULL LIMB (A-G) ml  Limb Volume differential (LVD)  %  Volume change since initial %  Volume change overall %  (Blank rows = not tested)   RLE COMPARATIVE LIMB  VOLUMETRICS :  10/05/23: 10th visit Progress Report  LANDMARK RIGHT  (dominant)  R LEG (A-D) 9040.2 ml  R THIGH (E-G) ml  R FULL LIMB (A-G) ml  Limb Volume differential (LVD)    Volume change since last measured on 07/26/23  R LEG VOLUME INCREASED by 23.8%%  Volume change since initial V  (Blank rows = not tested)  Moderate-Severe, Stage  II, Bilateral Lower Extremity Lipo- Lymphedema 2/2  Severe, Type III Lipedema involving buttocks, abdomen and ankles  Skin  Description Hyper-Keratosis Peau d' Orange Hyperplasia Tight Fibrotic/ Indurated Fatty Doughy Spongy/ boggy    x x  x x x x   Skin dry Flaky WNL Macerated   mild      Color Redness Varicosities Blanching Hemosiderin Stain Mottled    x    x   Odor Malodorous Yeast Fungal infection  WNL      x   Temperature Warm Cool wnl    x     Pitting Edema   1+ 2+ 3+ 4+ Non-pitting         x   Girth Symmetrical Asymmetrical                   Distribution   x  Toes to groin, abdomen, buttocks    Stemmer Sign Positive Negative   +    Lymphorrhea History Of:  Present Absent     x    Wounds History Of Present Absent Venous Arterial Pressure Sheer     x        Signs of Infection Redness Warmth Erythema Acute Swelling Drainage Borders                    Sensation Light Touch Deep pressure Hypersensitivity   Impaired In tact Impaired In tact Absent Impaired    x  x  x    Nails WNL   Fungus nail dystrophy   x     Hair Growth Symmetrical Asymmetrical   x    Skin Creases Base of toes  Ankles   Base of Fingers knees       Abdominal pannus Thigh Lobules  Face/neck    x  x x x  GAIT: Distance walked: >500' Assistive device utilized: Single point cane Level of assistance: Modified independence Comments: extra time needed  LYMPHEDEMA LIFE IMPACT SCALE (LLIS): 57.35% (The extent to which lymphedema related problems affected your life during the past week.)                                                                                                                           TREATMENT THIS DATE:  Pt edu for LE self-care: progress towards goals to date; LE precautions; Signs/ symptoms of cellulitis RLE/RLQ MLD and skin care as established Multilayer gradient compression bandages- knee length on R  PATIENT EDUCATION:  Continued Pt/ CG edu for lymphedema self care home program throughout session. Topics include outcome of comparative limb volumetrics- starting limb volume differentials (LVDs), technology and gradient techniques used for short stretch, multilayer compression wrapping, simple self-MLD, therapeutic lymphatic pumping exercises, skin/nail care, LE precautions, compression garment recommendations and specifications, wear and care schedule and compression garment donning / doffing w assistive devices. Discussed progress towards all OT goals since commencing CDT. Discussed detrimental impact of obesity on lower and upper extremity lymphedema over time. Reviewed OT goals for lymphedema care with Pt and discussed progress to date.   Provided Pt education re precautions related to use of advanced sequential pneumatic compression device. Pt verbalized understanding that she should never use device on 2 arms/legs simultaneously and should not use device 2 x on same day to avoid overloading her heart with fluid volume return both at present, and in years to come should her medical condition change. Pt instructed to remove device immediately should she experience atypical SOP, light headedness, or acute pain. Pt instructed to discontinue pump if she suspects, or has any infection, blood clot, cellulitis, the flu, corona virus, etc.  All questions answered to the Pt's satisfaction. Good return. Person educated: Patient and spouse Education method: Explanation, Demonstration, and Handouts Education comprehension: verbalized understanding, returned demonstration, verbal cues required, and needs further  education   LYMPHEDEMA SELF-CARE HOME PROGRAM:  BLE lymphatic pumping there ex using- 1 sets of 10 reps, each exercise in order-  1-2 x daily, bilaterally Simple self MLD 1 x daily  MLD utilizing short neck sequence, deep diaphragmatic breathing, stationary J strokes to functional inguinal LN, then proximal dynamic  J strokes to thigh, knee, bottleneck region, leg and foot. Sequences were repeated in reverse x distal to proximally x 3 to complete manual therapy. Daily skin care to increase hydration, skin mobility and decrease infection risk- can be done during MLD Intensive Phase CDT: Compression wraps 23/7 until garment fitting complete Self-management Phase CDT: Custom-made gradient compression garments and HOS devices are medically necessary because they are uniquely sized and shaped to fit the exact dimensions of the affected extremities, and to provide appropriate medical grade, graduated compression essential for optimally managing chronic, progressive lymphedema. Multiple custom compression garments are needed to ensure proper hygiene to limit infection risk. Custom compression garments should be replaced  q 3-6 months When worn consistently for optimal lipo-lymphedema self-management over time. HOS devices, medically necessary to limit fibrosis buildup in tissue, should be replaced q 2 years and PRN when worn out.    Daily   ASSESSMENT:  CLINICAL IMPRESSION:   Ms Kluttz demonstrates progress towards most OT goals for lymphedema management. A significant obstacle to limb volume reduction since commencing CDT is consistent compression wrapping. He spouse has learned how to apply wraps correctly, but , by report, they are only able to apply wraps in the morning before leaving for work about 50% of the time. Unfortunately without consistent compression wrapping limb volume reduction is not realistically achievable or sustainable. R LEG comparable limb volumetrics reveal a significant 23.8%  limb volume increase since commencing CDT on 07/26/23. This is very discouraging for Pt, no doubt. Last session we submitted anatomical measurements and specifications for Velcro wrap-style compression leggings, alternatives to traditional elastic stockings, that hopefully Pt will be able to don and doff with greater independence (extra time/ modified independence). Pt is awaiting insurance pre-authorization so these can be ordered.   Pt should be fit with adjustable compression garment alternative, Mediven CircAid Juxtafit Essentials, knee length bilaterally, size XL-WIDE CALF-LONG are medically necessary to provide accessible compression to control lipo-lymphedema of the legs over time,  that are easy to doff and don, and provide a built in gauging system allowing for graduated , adjustable compression.   Another significant obstacle to limb volume reduction is limited treatment frequency. Instead of attending OT 2 x weekly as recommended in the POC, she attends 1 x weekly. Reduced frequency is slowing progress and limiting potential for optimal clinical outcome.   Pt has obtained an advanced sequential compression device, the Flexitouch, to assist with hom,e lymphedema self-management. Unlike the basic vaso-pneumatic devices, the Flexitouch follows lymphatic anatomy using 32 chambers and includes treatment to involved proximal pathways, including buttocks, hips and abdomen.   Please refer to GOALS section below for additional details on progress to date.  Pt tolerated MLD to RLE/RLQ as established today without increased pain. Applied multilayer wraps below the knee on the R. Cont as per POC.  (09/13/23  FLEXITOUCH TRIAL: Shavelle Runkel presents with moderate-severe, stage II, BLE Lipo-lymphedema involving bilateral lower extremities, hips, buttocks and abdomen. Pt has undergone OT for CDT since 07/12/23. Pt has used the [employer] basic NIMBL sequential pneumatic compression device, or pump,  for more than 4 weeks with persistent chronic, progressive swelling and associated pain. The basic pump has failed to control swelling and reduce pain. An advanced Flexitouch device is medically necessary to decongest the lower quadrant hips, buttocks and abdomen to facilitate lymphatic decongestion out of the limbs and through proximal pathways above the groin to the thoracic duct.   Moderate-Severe, Stage  II, Bilateral Lower Extremity Lipo- Lymphedema 2/2  Severe, Type III Lipedema involving BLE, buttocks, abdomen and hips DIAGNOSIS: [x]  Lymphedema, not elsewhere classified [I89.0]  []  Hereditary lymphedema [Q 82.0]  []  Postmastectomy lymphedema [I 97.2]   []  Chronic Venous Stasis ulcers [I 87.2] (non-healing despite 6 months of ongoing treatment)  SWELLING SEVERITY:  International Society of Lymphology clinical classification: []  Stage 0: Latent or subclinical condition where swelling is not yet evident despite impaired lymph transport, subtle alterations in tissue fluid/composition and changes in subjective symptoms.  It may exist months or years before overt edema occurs.  []  Stage I: Early accumulation of fluid relatively high in protein content (e.G., in comparison with "venous" edema) which subsides  with limb elevation.  Pitting may occur.  An increase in various types of proliferating cells may also be seen.  [x]  Stage II: Limb elevation alone rarely reduces the tissue swelling and pitting is manifest.  Later in Stage II, the limb may not pit as excess subcutaneous fat and fibrosis develop.  []  Stage III: Lymphostatic elephantiasis where pitting can be absent and trophic skin changes such as acanthosis, alterations in skin character and thickness, further deposition of fat and fibrosis, and warty overgrowths have developed.   SYMPTOMS: Patient exhibits the following symptoms despite conservative therapy: []  Hyperkeratosis  [x]  Hyperplasia  [x]  Hyperpigmentation  []  Skin breakdown with  lymphorrhea (skin weeping)  []  Papillomatosis  []  Recurrent cellulitis  [x]  Fibrosis   []  Elephantiasis [x]  Progressive edema [x]  Truncal/abdominal swelling []  Chest/axillary swelling []  Genital swelling [x]  Unable to control swelling [x]  Impaired ROM [x]  Impaired mobility [x]  Pain []  Scarring CONSERVATIVE TREATMENT: Patient has tried conservative treatments:  [x]  Yes []  No  If YES, specify type of conservative treatment and duration of use: Compression garments:    []  <4 weeks   []  1-5 months   []  6-12 months  []  >1 year Bandaging:     []  <4 weeks   [x]  1-5 months   []  6-12 months  []  >1 year Elevation:       []  <4 weeks   [x]  1-5 months   []  6-12 months  []  >1 year  Exercise:         []  <4 weeks   [x]  1-5 months   []  6-12 months  []  >1 year  Complete Decongestive Therapy (CDT) []  <4 weeks   [x]  1-5 months   []  6-12 months  []  >1 year  Instructed on self-manual lymphatic drainage techniques (self-MLD): [x]  Yes []  No Instructed on appropriate skin/nail care practices:    [x]  Yes []  No Evaluation of diet and medications:      [x]  Yes []  No  PNEUMATIC COMPRESSION DEVICE EVALUATION: Patient has tried an Z9348 basic pneumatic compression device?  [x]  Yes []  No  If YES: Z9348 basic pneumatic compression device [employer] NIMBL was used at 30-400 mmHg for:  []  <4 weeks   [x]  1-5 months   []  6-12 months  []  >1 year  []  Discontinued use prior to 4 weeks due to negative symptoms Was the E0651 basic pneumatic compression device effective in managing the patient's lymphedema?  []  Yes []  No   If NO, an [phone] was not used, the following symptoms demonstrate why it was ineffective or deemed not clinically appropriate to manage the patient's lymphedema: []  E0651 basic pneumatic compression device caused or exacerbated swelling proximal to pump sleeve (top of thigh, genitals, abdomen, hips, buttocks, chest, etc.)  []  Z9348 basic pneumatic compression device was unable to accommodate limb size  []   Patient unable to tolerate or do not recommend E0651 basic pneumatic compression device due to pain/compromised skin integrity (e.G., open wounds/ulcers, scarring, sensitivity)  [x]  Z9348 basic pneumatic compression device did not control swelling or result in clinical improvement  Is an E0652 advanced pneumatic compression device medically necessary to treat the clinical symptoms listed above that prevent effective treatment with the Z9348 basic pneumatic compression device? [x]  Yes []  No   TREATMENT PLAN: Patient to complete or continue the following treatment(s):  Compression Garments and/or Bandaging   Yes [x]  No Exercise       [x]  Yes []  No Elevation      [x]  Yes []  No  Z9348 Basic Pneumatic Compression Device   []  Yes [x]  No E0652 Advanced Pneumatic Compression Device  [x]  Yes []  No Professional Lymphedema Treatment (CDT)  [x]  Yes []  No Self-MLD       [x]  Yes []  No   MEASUREMENTS: NIMBL Pre and post trial circumferential measurements reveal increases in circumferences at all anatomical landmarks over time. These data demonstrate a failure of the NIMBL basic sequential pneumatic device for lymphedema control.   Lower extremity measurements (measure the largest circumferential point of the listed area):  Date: 04/20/23 NIMBL Pre trial  Date: 04/20/23 NIMBL Pre trial  [x]   Right Leg  Measurements   [x]   Left Leg Measurements  Inseam 63 cm  Inseam 63 cm  Thigh 87 cm  Thigh 84 cm  Knee  58 cm  Knee 61 cm  Calf 47 cm  Calf 48 cm  Ankle 27 cm  Ankle 28 cm  Hips 160 cm  Hips _________cm  Abdomen/Trunk _________cm  Abdomen/Trunk _________cm    Date:09/13/23 NIMBL Post trial  Date:09/13/23  NIMBL Post trial   [x]   Right Leg  Measurements   [x]   Left Leg Measurements    Inseam _________cm  Inseam _________cm   Thigh 93.5 cm  Thigh 96 cm   Knee 60.5 cm  Knee 61.5 cm   Calf 60 cm  Calf  61 cm   Ankle 34 cm  Ankle 35 cm   Hips 168 cm  Hips _________cm   Abdomen _________cm   Abdomen _________cm     Clinician Signature: TG  (07/12/23 Initial OT eval: Kioni Stahl is a 63 yo female presenting with moderate to sever, stage II, lipo-lymphedema 2/2 to Type III LIPEDEMA, Class III Obesity, and suspected venous insufficiency. Her condition involves buttocks, abdomen and bilateral lower extremities. It is exacerbated by joint inflammation, obstructive sleep apnea and hypertension. This patient has a complex constellation of contributing factors.  Lipedema typically affects women and is characterized by painful, symmetrical, excessive deposition of fat below the waist in the legs, thighs and buttocks resulting in a disproportionate waist to hip ratio. It may also include the arms. Unlike lymphedema where swelling extends to the feet and presents as asymmetrical, the feet are spared and cuff-like accumulation of thickened, fibrotic, fatty tissue is noted at distal legs. Accompanying class III obesity favors the development of lipo-lymphedema, and/ or obesity-induced lymphedema. As lipedema progresses fatty fibrosis accumulates in the subcutis putting pressure on delicate lymphatics resulting in excessive lymph accumulation and progressive lymphedema. The combined condition of lipo-lymphedema is difficult to treat as lipedema does not typically respond well to Complete Decongestive Therapy (CDT), while lymphedema may respond to varying degrees. Well fitting, comfortable compression garments that do not constrict or roll down often provide pain relief and enable improved functional performance. An advanced, sequential, pneumatic , compression device, The Flexitouch from [employer], may reduce pain and discomfort while helping to reduce lymphatic congestion by incorporating anatomically correct lymphatic channels into the device garments.  Extreme obesity can cause lower extremity lymphedema, termed obesity-induced lymphedema (OIL). Obesity-induced lymphedema is secondary  lymphedema that may occur once an individual's body mass index (BMI) exceeds 40. Ms Womble-Miller's BMI measures 55.6 today. The risk of lymphatic dysfunction increases with elevated BMI and is almost universal once BMI exceeds 60. Patients with obesity-induced lymphedema also may develop areas of massive, localized lymphedema. Individuals with OIL are in an unfavorable cycle of weight gain and lymphatic injury. As BMI increases lymphedema worsens, ambulation becomes more difficult, and  BMI further rises. The fundamental treatment for OIL is weight loss.   Ms. Sharren Watkins will benefit from skilled Occupational Therapy to reduce limb swelling and associated pain, to limit progression and infection risk. BLE lymphedema limits functional performance in all occupational domains, including functional mobility and ambulation,  basic and instrumental ADLs, productive activities, leisure pursuits, social participation and quality of life. This patient will benefit from modified Intensive and Self Management Phase CDT . In addition to MLD, ther ex, skin care and compression bandaging below the knees, Pt will be fitted with custom knee length compression stockings paired with off the shelf , Capri length compression leggings. If insurance benefits allow, she will undergo a trial in the clinic with the advanced Flexitouch device in an effort to reduce discomfort and reduce infection risk. Without skilled OT Li-lymphedema will worsen over time and further functional decline is expected. )   OBJECTIVE IMPAIRMENTS: Abnormal gait, decreased balance, decreased knowledge of condition, decreased knowledge of use of DME, decreased mobility, difficulty walking, decreased ROM, decreased strength, increased edema, obesity, pain, and lymphedema -related pain and chronic limb swelling.   ACTIVITY LIMITATIONS: Mobility and functional ambulation limitations:  abnormal gait pattern, carrying, lifting, bending, sitting, standing,  squatting, stairs, transfers, and bed mobility Basic and instrumental ADLs (reaching feet and distal legs to groom nails, inspect skin, apply lotion, bathe lower body, difficulty with LB dressing, fitting shoes and socks, impaired sleeping, meal prep, standing to cook, driving, shopping, yard work, house work Paediatric nurse activities: work related activities requiring extended standing, walking, sitting; caring for others Social participation in the community, socializing with others, LE affects my body image completely, interferes w intimacy Leisure pursuits requiring extended standing, walking, sitting  PERSONAL FACTORS: Age, Past/current experiences, Time since onset of injury/illness/exacerbation, and 3+ comorbidities: lipedema, class III Obesity, OSA, arthritis are also affecting patient's functional outcome.   REHAB POTENTIAL:Pt has difficulty reaching feet, so requires assistance applying and removing multi-layer compression bandaging at home daily between OT visits to achieve  optimal limb volume reduction and functional outcomes. Without consistent, daily caregiver assistance with compression wrapping throughout CDT prognosis is poor. With daily assistance with wrapping, prognosis is fair due to BMI  and Lipedema co-morbidities   EVALUATION COMPLEXITY: Complex  GOALS: Goals reviewed with patient? Yes  SHORT TERM GOALS: Target date: 4th OT Rx visit   Pt will demonstrate understanding of lymphedema precautions and prevention strategies with modified independence using a printed reference to identify at least 5 precautions and discussing how s/he may implement them into daily life to reduce risk of progression with extra time. Baseline:Max A Goal status: GOAL MET  2.  Pt will be able to apply multilayer, knee length, gradient, compression wraps to one leg at a time from toes to below knee with Max caregiver A x 1 to decrease limb volume, to limit infection risk, and to limit lymphedema  progression.  Baseline: Dependent Goal status: GOAL MET  LONG TERM GOALS: Target date: 08/08/23 1.Given this patient's Intake score of 57.35 % on the Lymphedema Life Impact Scale (LLIS), patient will experience a reduction of at least 10 points in her perceived level of functional impairment resulting from lymphedema to improve functional performance and quality of life (QOL). Baseline: 57.35 % Goal status:PROGRESSING  2.  Pt will achieve at least a 10% volume reduction in B legs to return limb to typical size and shape, to limit infection risk and LE progression, to decrease pain, to improve function. Baseline: Dependent Goal status:  PROGRESSING: To date R LEG volume is significantly increased by 23.8%  3.  Pt will obtain appropriate compression garments/devices and achieve modified independence (extra time + assistive devices) with donning/doffing to optimize limb volume reductions and limit LE progression over time. Baseline: Dependent Goal status: PROGRESSING- CircAid order submitted to DME provider  During Intensive phase CDT, with modified independence, Pt will achieve at least 85% compliance with all lymphedema self-care home program components, including daily skin care, compression wraps and /or garments, simple self MLD and lymphatic pumping therex to habituate LE self care protocol  into ADLs for optimal LE self-management over time. Baseline: Dependent Goal status: PROGRESSING- consistent wrapping remains challenging. Pt reports wrapping about 50% of the time.  PLAN:  OT FREQUENCY: 2x/week and PRN  OT DURATION: other: 18 weeks and PRN  PLANNED INTERVENTIONS: Complete Decongestive Therapy (CDT): Manual Lymphatic Drainage (MLD) , Skin care, ther ex, gradient [id number]-Therapeutic exercises, 97530- Therapeutic activity, 97535- Self Care, [id number]- Manual therapy, Patient/Family education, Manual lymph drainage, Compression bandaging, DME instructions, and trial with advanced,  sequential, pneumatic, compression device ([employer] Flexitouch) Custom-made gradient compression garments and HOS devices are medically necessary because they are uniquely sized and shaped to fit the exact dimensions of the affected extremities, and to provide appropriate medical grade, graduated compression essential for optimally managing chronic, progressive lymphedema. Multiple custom compression garments are needed to ensure proper hygiene to limit infection risk. Custom compression garments should be replaced q 3-6 months When worn consistently for optimal lipo-lymphedema self-management over time. HOS devices, medically necessary to limit fibrosis buildup in tissue, should be replaced q 2 years and PRN when worn out.   PLAN FOR NEXT SESSION:  Continue MLD and compression wraps as established. Continue skin care during MLD to reduce infection risk.  Continue teaching lymphedema self-care  Zebedee Dec, MS, OTR/L, CLT-LANA 10/04/23 9:03 AM

## 2023-10-11 ENCOUNTER — Ambulatory Visit: Admitting: Occupational Therapy

## 2023-10-18 ENCOUNTER — Encounter: Payer: Self-pay | Admitting: Psychology

## 2023-10-18 ENCOUNTER — Ambulatory Visit: Admitting: Occupational Therapy

## 2023-10-18 ENCOUNTER — Encounter: Attending: Psychology | Admitting: Psychology

## 2023-10-18 DIAGNOSIS — R413 Other amnesia: Secondary | ICD-10-CM | POA: Insufficient documentation

## 2023-10-18 NOTE — Progress Notes (Signed)
 NEUROPSYCHOLOGICAL EVALUATION [place]  Physical Medicine and Rehabilitation     Patient: Angel Watkins  MRN: [id number] DOB: 07/29/1960  Age: 63 y.o. Sex: female  Race/Ethnicity: Black or African American  Years of Education: 16 Handedness: Right  Referring Provider: Buck Saucer, MD, PhD  Provider/Clinical Neuropsychologist: Evalene DOROTHA Riff, PsyD  Date of Service: 10/18/2023 Start Time: 3 PM End Time: 5 PM  Location of Service:  [facility] Physical Medicine & Rehabilitation Department [place] [address], [place]. [address] [place], [place] [address] Phone: [phone]  Billing Code/Service:            96116/96121  Individuals Present: Patient was seen unaccompanied, in-person, by the provider. 1 hour and 15 minutes spent in face-to-face clinical interview and remaining 45 minutes was spent in record review, documentation, and testing protocol construction.    PATIENT CONSENT AND CONFIDENTIALITY The patient's understanding of the reason for referral was intact. Discussed limits of confidentiality including, but not limited to, posting of final evaluation report in the patient's electronic medical record for both the patient and for the referring provider and appropriate medical professionals. Patient was given the opportunity to have their questions answered. The neuropsychological evaluation process was discussed with the patient and they consented to proceed with the evaluation.  Consent for Evaluation and Treatment: Signed: Yes Explanation of Privacy Policies: Signed: Yes Discussion of Confidentiality Limits: Yes  REASON FOR REFERRAL:The patient was referred by Dr. Buck from [facility] on 07/21/2023 for neuropsychological evaluation for cognitive testing within the context of subjective cognitive complaints and mildly abnormal score on MoCA (20/30). Per records from the referring provider and record  review, the patient's medical history includes arthritis, back pain, diabetes, lower extremity swelling, lymphedema, vitamin D  deficiency, fibromyalgia, hypertension, anxiety, palpitations, spinal stenosis, sleep apnea, obesity, s/p gastric bypass surgery many years prior. Cognitive complaints were reported to have been present for at least 1 year. Intermittent tremors were also reported to be present during that duration. She has a family history of Parkinson's disease (PD) or parkinsonism. Per notes from her initial neurology consult on 07/21/23, examination did not show telltale signs of PD or parkinsonism, nor essential tremor. Neurological exam was reported to be reassuring in that regard. Cognitive symptoms may reflect multifactorial etiology, per neurology. Further workup was ordered including MRI, sleep study, and checking B12 level with her PCP. Some sleep difficulties were noted. 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  She has had some difficulty sequencing work but has not had any trouble at work.  She still drives and she has not had any trouble navigating." MRI is scheduled for 10/9 per records.   Upon interview, the patient expressed concerns regarding her cognitive and physical symptoms.  The provider described the evaluation process in detail and discussed with the patient the reason for the evaluation and how results may provide insight into her cognitive difficulties.  HISTORY OF PRESENTING CONCERNS:  Cognitive Symptom Onset & Course: Patient described gradual onset of cognitive symptoms approximately 1 year ago.  She indicated that she suspects that symptoms have slowly progressed over time.  The patient noted multiple significant life events within the last few years including the passing of her mother in 2018, her mother-in-law shortly after, the loss of her dad in 2021, and her grandmother in 2024.  Patient  reported first onset of difficulties with depression around the time  of the loss of her mother.  Depressive symptoms have persisted although are currently of reduced intensity relative to a few years ago. Current Cognitive Complaints:  Memory: The patient endorsed relatively infrequent difficulties remembering details from conversations.  She denied problems with remembering recent events or frequently misplacing things.  She indicated that former coworkers of hers have told her that she repeats herself without realizing it although this has been long running and present since at least 2011.  She denies significant problems tracking her medical appointments and managing her medications.  She denied any problems with getting lost in familiar locations. Processing Speed: Patient reported no noticeable changes with respect to processing speed. Attention & Concentration: The patient indicated that she has noticed changes with respect to attention and concentration such that she will be easily sidetracked due to both internal and external distractions.  She reported jumping to different tasks before completing the first one and ultimately less efficient at work and at home.  She endorsed occasional difficulty with losing her train of thought.  Patient described indications that she is capable of sustaining attention over time however. Language: The patient Dors difficulties with word finding.  She denies instances of others pointing out to her that she is unintentionally using the wrong word.  She denied any problems with receptive language in conversation.  She denied any significant changes in comprehension and reading although noted some attentional interference.  She indicated she has not noticed any significant changes with mental arithmetic and denied any problems with writing composition. Visual-Spatial: Based on the patient's descriptions, there is no clear indications of difficulty with visual-spatial  skills. Executive Functioning: The patient denied any changes in her abilities to make decisions or solve day-to-day problems.  She denied changes in her ability to plan or stay organized.  She reported no clear indications of changes in personality or behavior.  Motor/Sensory Complaints:   Sensory changes: No difficulties or significant changes in her sense of smell, taste, hearing, or vision. Balance/coordination difficulties: The patient reported some difficulties with balance but indicated these are due to identified causes.  She did describe one slightly atypical.  In which my feet just did not want to move occurring around the time of loss of her mother.  One fall resulted from this.  This is since resolved. Frequent instances of dizziness/vertigo: None. Other motor difficulties: As noted, records from neurology indicated generally reassuring results from exam with respect to parkinsonism or essential tremor.  The patient expressed awareness of this finding.  Patient reported that she began experiencing intermittent tremors around 9 to 10 months ago involving her hands.  It is presents if holding a coffee cup or phone for extended period of time.  She indicated that her spouse indicated that her bottom lip will sometimes tremor although she has not noticed this.  Patient describes some changes in her handwriting although noted that she underwent a right shoulder replacement in March of this year.  She denied any significant changes in handwriting aside from those attributable to this.  Emotional and Behavioral Functioning:  History: The patient denied any significant history of depression or anxiety prior to the loss of her mother.  She did describe a period of increased frequency of panic attack during adulthood and indicated that it was related to a trauma that she experienced.  She reported no clear indications of active PTSD symptomology.  Patient noted that the recent loss of loved ones family  members has been particularly challenging for  her as she is an only child and has no children herself.  She did however indicate that she does have loved ones in her life still.  The patient had been involved in individual therapy for.  After the death of her first husband.  She had no other instances of treatment other than medication which started around the time over the mother's death. Depression: Patient denied feeling down or blue more days than not but her mood is lower relative to her premorbid baseline.  She endorsed more easily being brought to tears.  She denied indications of anhedonia and reported that her energy is relatively good overall.  She denied any current or past suicidal ideation or other clear indications of depression.  She reported that the medication she has been on has been beneficial for evening out her mood and helping her maintain perspective. Anxiety: She denied problems with generalized worry, difficulties relaxing, frequent feelings of tension. Other: She denied any current or past suicidal ideation, homicidal ideation, paranoia, hallucinations, indications of mania, inpatient psychiatric admission.  She denied any significant life stressors at present.  Sleep: Denied significant difficulties with sleep onset.  Typically sleeps about 6 hours on a good night and 4 hours on a bad night.  She indicated that sleep is good approximately five nights of the week.  She reported that her difficulties are primarily related to waking up during the night, sometimes due to back pain, and struggles with returning to sleep.  She indicated she typically needs about 6 hours of sleep to feel rested.  She denied any reports of RBD's.  She indicated that her recent sleep apnea evaluation, consistent with records, was mild. Appetite: No significant changes in weight or difficulties with appetite. Caffeine: 2 cups of coffee each morning on average. Alcohol Use: None Tobacco Use:  None Recreational Substance Use: None  Level of Functional Independence: The patient is intact with both basic and instrumental activities of daily living.   Medical History/Record Review: History of traumatic brain injury/concussion: None History of stroke: None History of heart attack: None History of cancer/chemotherapy: None History of seizure activity: None Symptoms of chronic pain: Yes.  Average pain levels are six or 7 out of 10.  Highest pain levels are an 8-9 out of 10 which occurs once or twice per week. Experience of frequent headaches/migraines: Over the last 6 months has been experiencing, on average, one headache a week that lasts for 2 hours on average.  She suspects there are tension headaches.  Past Medical History:  Diagnosis Date   Anxiety    Arthritis    Back pain    Bilateral swelling of feet    Bulging lumbar disc    and cervical   Diabetes mellitus without complication (HCC)    Fibromyalgia    Hypertension    Joint pain    Lymphedema    Palpitations    Sleep apnea    Spinal stenosis    Wears glasses     Patient Active Problem List   Diagnosis Date Noted   Morbid obesity (HCC) 07/14/2023   Hyperlipidemia associated with type 2 diabetes mellitus (HCC) 05/29/2023   SOBOE (shortness of breath on exertion) 05/24/2023   Lymphedema 03/08/2023   Anxiety and depression 12/06/2022   Eating disorder 12/02/2020   Vitamin D  deficiency 12/02/2020   Type 2 diabetes mellitus with hyperglycemia, without long-term current use of insulin  (HCC) 07/20/2020   Hypertension associated with diabetes (HCC) 07/20/2020   Class 3 severe obesity  with serious comorbidity and body mass index (BMI) of 50.0 to 59.9 in adult 07/20/2020   Cervical radiculopathy 12/03/2018   Family Neurologic/Medical Hx:  Family History  Problem Relation Age of Onset   Hypertension Mother    Cancer Mother    Sleep apnea Mother    Obesity Mother    Diabetes Father    Hypertension Father     Stroke Father    Parkinson's disease Father    Dementia Maternal Grandmother    Parkinson's disease Paternal Grandmother    Dementia Paternal Grandmother    Medications:  atorvastatin  (LIPITOR) 20 MG tablet Take 20 mg by mouth daily.  buprenorphine  (BUTRANS ) 10 MCG/HR PTWK Place 1 patch onto the skin once a week. Cholecalciferol (VITAMIN D3) 125 MCG (5000 UT) CAPS Take 1 capsule (5,000 Units total) by mouth daily. Cyanocobalamin (VITAMIN B-12 PO) Take 1 tablet by mouth daily. diclofenac  (VOLTAREN ) 75 MG EC tablet Take 75 mg by mouth 2 (two) times daily. ferrous sulfate 325 (65 FE) MG tablet Take 325 mg by mouth daily with breakfast. irbesartan  (AVAPRO ) 75 MG tablet Take 75 mg by mouth daily. omeprazole (PRILOSEC) 20 MG capsule Take 20 mg by mouth daily. Semaglutide , 1 MG/DOSE, 4 MG/3ML SOPN Inject 1 mg as directed once a week. sertraline  (ZOLOFT ) 50 MG tablet Take 1.5 tablets (75 mg total) by mouth daily. torsemide  (DEMADEX ) 20 MG tablet Take 20 mg by mouth daily.  Academic/Vocational History: Years completed: 90 Grades/Performance: B student Learning difficulties in early education: No Attention or behavioral problems during her youth: No  Currently works as an Environmental health practitioner for [employer]. She has been with the city of [place] for 10.5 years. She previously worked for [employer]. She worked there for 23 years until their closure. She indicated work is going well and is able to perform her duties without concern, but may be slightly less efficient due to the previously mentioned attention difficulties. She is looking forward to retirement in about 19 months.    Psychosocial: Marital Status: Widowed - married 5 years. Married to her current husband for 18 years.  Children/Grandchildren: No Living Situation: Lives lone with spouse.  Daily Activities/Hobbies: [place], going to the movies, cruise around town with her husband.   Mental  Status/Behavioral Observations: The patient was seen on an outpatient basis in the [facility] PM&R office for the clinical interview unaccompanied. Sensorium/Arousal: Alert.  Hearing and vision were adequate for the purpose of the evaluation. Orientation: Full. Appearance: Appropriate for the setting. Behavior: Attentive and cooperative. Speech/Language: Conversational speech was prosodic, fluent, and well-articulated. Motor: Ambulated slowly but independently. Social Comportment: Appropriate for the setting. Mood: Largely neutral, but sometimes dysphoric or slightly anxious depending on topic of discussion.  Affect: Congruent. Thought Process/Content: Coherent, linear, and goal directed. Ability to Participate in Interview: Readily answered all questions posed during the clinical interview with adequate detail regarding personal history. Insight: Good.   SUMMARY / CLINICAL IMPRESSIONS The patient was referred by Dr. Buck from [facility] on 07/21/2023 for neuropsychological evaluation for cognitive testing within the context of subjective cognitive complaints and mildly abnormal score on MoCA (20/30). Per records from the referring provider and record review, the patient's medical history includes arthritis, back pain, diabetes, lower extremity swelling, lymphedema, vitamin D  deficiency, fibromyalgia, hypertension, anxiety, palpitations, spinal stenosis, sleep apnea, obesity, s/p gastric bypass surgery many years prior. Cognitive complaints were reported to have been present for at least 1 year. Intermittent tremors were also reported to be present  during that duration. She has a family history of Parkinson's disease (PD) or parkinsonism. Per notes from her initial neurology consult on 07/21/23, examination did not show telltale signs of PD or parkinsonism, nor essential tremor. Neurological exam was reported to be reassuring in that regard. Cognitive symptoms may reflect  multifactorial etiology, per neurology. Further workup was ordered including MRI, sleep study, and checking B12 level with her PCP. Some sleep difficulties were noted. 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  She has had some difficulty sequencing work but has not had any trouble at work.  She still drives and she has not had any trouble navigating." MRI is scheduled for 10/9 per records.   Upon interview, the patient expressed concerns regarding her cognitive and physical symptoms.  The provider described the evaluation process in detail and discussed with the patient the reason for the evaluation and how results may provide insight into her cognitive difficulties.  During the clinical interview, the patient described some minor difficulties with short-term memory and more prominent difficulties with attention and concentration.  She also describes some word finding difficulties.  The patient is intact with respect to activities of daily living.  She works full-time.  Patient has experienced notable losses over recent years and, while improved, still experiences some depressive symptoms.  Symptoms appear relatively well-managed via medication.  She describes minimal indications of anxiety or worry, but expressed concerns about her family history and fears that she will develop Parkinson's.  The patient was provided psychoeducation about cognitive changes within the context of Parkinson's disease and confirmed her understanding of conclusions from the patient's encounter with neurology.  The provider discussed the neuropsychological evaluation process and how it can serve as an additional source of data determining whether there are any unidentified pathology underlying her cognitive changes, or whether findings are consistent with expectations based on age.  The patient was amenable to proceeding with the evaluation within this  context.  DISPOSITION / PLAN The patient has been set up for a formal neuropsychological assessment to objectively assess her cognitive functioning across domains to establish the patient's cognitive profile. This data, in conjunction with information obtained via clinical interview and medical record review, will help clarify likely etiology and guide treatment recommendations. Once data collection and interpretation have been completed, the findings / diagnosis and recommendations will be reviewed and discussed with the patient during a feedback appointment with the neuropsychologist. Based on the collaborative dialogue with the patient during the feedback, recommendations may be adjusted / tailored as needed. A formal report will be produced and provided to the patient and the referring provider.   Diagnosis: Memory loss             Evalene DOROTHA Riff, PsyD             Neuropsychologist   This report was generated using voice recognition software. While this document has been carefully reviewed, transcription errors may be present. I apologize in advance for any inconvenience. Please contact me if further clarification is needed.

## 2023-10-24 ENCOUNTER — Ambulatory Visit (INDEPENDENT_AMBULATORY_CARE_PROVIDER_SITE_OTHER): Admitting: Family Medicine

## 2023-10-25 ENCOUNTER — Ambulatory Visit (INDEPENDENT_AMBULATORY_CARE_PROVIDER_SITE_OTHER): Admitting: Family Medicine

## 2023-10-25 ENCOUNTER — Ambulatory Visit: Admitting: Occupational Therapy

## 2023-10-25 ENCOUNTER — Encounter (INDEPENDENT_AMBULATORY_CARE_PROVIDER_SITE_OTHER): Payer: Self-pay | Admitting: Family Medicine

## 2023-10-25 VITALS — BP 101/68 | HR 72 | Temp 98.1°F | Ht 64.0 in | Wt 335.0 lb

## 2023-10-25 DIAGNOSIS — E1165 Type 2 diabetes mellitus with hyperglycemia: Secondary | ICD-10-CM

## 2023-10-25 DIAGNOSIS — Z6841 Body Mass Index (BMI) 40.0 and over, adult: Secondary | ICD-10-CM

## 2023-10-25 DIAGNOSIS — F32A Depression, unspecified: Secondary | ICD-10-CM | POA: Diagnosis not present

## 2023-10-25 DIAGNOSIS — E669 Obesity, unspecified: Secondary | ICD-10-CM

## 2023-10-25 DIAGNOSIS — Z7985 Long-term (current) use of injectable non-insulin antidiabetic drugs: Secondary | ICD-10-CM

## 2023-10-25 DIAGNOSIS — F419 Anxiety disorder, unspecified: Secondary | ICD-10-CM | POA: Diagnosis not present

## 2023-10-25 MED ORDER — SEMAGLUTIDE (1 MG/DOSE) 4 MG/3ML ~~LOC~~ SOPN: 1.0000 mg | PEN_INJECTOR | SUBCUTANEOUS | 0 refills | Status: DC

## 2023-10-25 MED ORDER — SERTRALINE HCL 50 MG PO TABS: 75.0000 mg | ORAL_TABLET | Freq: Every day | ORAL | 0 refills | Status: DC

## 2023-10-25 NOTE — Progress Notes (Signed)
 SUBJECTIVE:  Chief Complaint: Obesity  Interim History: Patient got fitted for her compression garment that zips up since last appointment.  She also has a wrap that she could most likely do herself.  She feels like the swelling is getting better controlled.  Patient is now dealing with left hand numbness about 60% of her wake hours. Has MRI scheduled October 9th.  She is surprised by her weight loss. She is still dealing with occasionally not eating. Food a [employer] of bariatric recipes.  She is following this page to find recipes that are relatively simple to prepare and high in protein.  She mentions she is likely around 40 grams of protein daily.  She is back to eating more eggs in the am.  She is eating a sandwich at lunch.  She occasionally misses dinner.  No upcoming events or activities.  Her family is coming to her house for Thanksgiving.   Dorota is here to discuss her progress with her obesity treatment plan. She is on the Category 2 Plan and states she is following her eating plan approximately 50 % of the time. She states she is not exercising.  OBJECTIVE: Visit Diagnoses: Problem List Items Addressed This Visit   None   Vitals Temp: 98.1 F (36.7 C) BP: 101/68 Pulse Rate: 72 SpO2: 99 %   Anthropometric Measurements Height: 5' 4 (1.626 m) Weight: (!) 335 lb (152 kg) BMI (Calculated): 57.47 Weight at Last Visit: 342 lb Weight Lost Since Last Visit: 7 Weight Gained Since Last Visit: 0 Starting Weight: 333 lb Total Weight Loss (lbs): 0 lb (0 kg)   Body Composition  Body Fat %: 62.9 % Fat Mass (lbs): 211.2 lbs Muscle Mass (lbs): 118.4 lbs Visceral Fat Rating : 27   Other Clinical Data Fasting: no Labs: no Today's Visit #: 46 Starting Date: 04/17/20 Comments: Cat 2     ASSESSMENT AND PLAN: Assessment & Plan Anxiety and depression Patient tried increasing her zoloft  to 100mg  daily and felt very tired.  She denies suicidial and homicidal ideation  but mentions her symptoms aren't are well controlled as she would like.   Type 2 diabetes mellitus with hyperglycemia, without long-term current use of insulin  (HCC) Patient is tolerating Ozempic  at current dose.  She does occasionally fall into old habits of not eating all the prescribed nutrition for the day.  She will continue on with her current meal plan working on decreasing total skipping of meals and even implementing a snack to get more nutrition and daily. BMI 50.0-59.9, adult (HCC)  Obesity with starting BMI of 57.1    Diet: Asha is currently in the action stage of change. As such, her goal is to continue with weight loss efforts and has agreed to the Category 2 Plan.   Exercise:  All adults should avoid inactivity. Some activity is better than none, and adults who participate in any amount of physical activity, gain some health benefits.  Behavior Modification:  We discussed the following Behavioral Modification Strategies today: increasing lean protein intake, decreasing simple carbohydrates, increasing vegetables, and no skipping meals.  Following up in 4 to 5 weeks.   She was informed of the importance of frequent follow up visits to maximize her success with intensive lifestyle modifications for her multiple health conditions.  Attestation Statements:   Reviewed by clinician on day of visit: allergies, medications, problem list, medical history, surgical history, family history, social history, and previous encounter notes.     Adelita Cho, MD

## 2023-10-25 NOTE — Assessment & Plan Note (Signed)
 Patient tried increasing her zoloft  to 100mg  daily and felt very tired.  She denies suicidial and homicidal ideation but mentions her symptoms aren't are well controlled as she would like.

## 2023-10-29 NOTE — Assessment & Plan Note (Signed)
 Patient is tolerating Ozempic  at current dose.  She does occasionally fall into old habits of not eating all the prescribed nutrition for the day.  She will continue on with her current meal plan working on decreasing total skipping of meals and even implementing a snack to get more nutrition and daily.

## 2023-10-31 ENCOUNTER — Encounter

## 2023-11-01 ENCOUNTER — Encounter: Payer: Self-pay | Admitting: Occupational Therapy

## 2023-11-01 ENCOUNTER — Ambulatory Visit: Attending: Physician Assistant | Admitting: Occupational Therapy

## 2023-11-01 DIAGNOSIS — I890 Lymphedema, not elsewhere classified: Secondary | ICD-10-CM | POA: Diagnosis present

## 2023-11-01 NOTE — Therapy (Signed)
 OUTPATIENT OCCUPATIONAL THERAPY TREATMENT NOTE AND PROGRESS REPORT  BILATERAL LOWER EXTREMITY/ BILATERAL LOWER QUADRANT LIPO-LYMPHEDEMA  Patient Name: Angel Watkins MRN: [id number] DOB:19-Jun-1960, 63 y.o., female Today's Date: 11/01/2023  REPORTING PERIOD 07/12/23 - 10/04/23  END OF SESSION:   OT End of Session - 11/01/23 0806     Visit Number 11    Number of Visits 36    Date for Recertification  01/30/24    OT Start Time 0800    OT Stop Time 0852    OT Time Calculation (min) 52 min    Activity Tolerance Patient tolerated treatment well;No increased pain    Behavior During Therapy WFL for tasks assessed/performed            Past Medical History:  Diagnosis Date   Anxiety    Arthritis    Back pain    Bilateral swelling of feet    Bulging lumbar disc    and cervical   Diabetes mellitus without complication (HCC)    Fibromyalgia    Hypertension    Joint pain    Lymphedema    Palpitations    Sleep apnea    Spinal stenosis    Wears glasses    Past Surgical History:  Procedure Laterality Date   ABDOMINAL HYSTERECTOMY     ANAL FISSURE REPAIR     ANTERIOR CERVICAL DECOMP/DISCECTOMY FUSION N/A 12/03/2018   Procedure: Cervical five-six  Anterior cervical decompression/discectomy/fusion;  Surgeon: Cheryle Debby LABOR, MD;  Location: [facility] OR;  Service: Neurosurgery;  Laterality: N/A;   BUNIONECTOMY Left 2009   GASTRIC BYPASS     LUMBAR LAMINECTOMY     RADIOLOGY WITH ANESTHESIA N/A 12/14/2017   Procedure: MRI WITH ANESTHESIA, LUMBAR SPINE WITHOUT CONTRAST, CERVICAL SPINE WITHOUT CONTRAST;  Surgeon: Radiologist, Medication, MD;  Location: [facility] OR;  Service: Radiology;  Laterality: N/A;   RADIOLOGY WITH ANESTHESIA N/A 10/08/2019   Procedure: MRI WITH ANESTHESIA    LUMBAR WITHOUT;  Surgeon: Radiologist, Medication, MD;  Location: [facility] OR;  Service: Radiology;  Laterality: N/A;   right shoulder total replacement     Patient Active Problem List   Diagnosis Date Noted    Morbid obesity (HCC) 07/14/2023   Hyperlipidemia associated with type 2 diabetes mellitus (HCC) 05/29/2023   SOBOE (shortness of breath on exertion) 05/24/2023   Lymphedema 03/08/2023   Anxiety and depression 12/06/2022   Eating disorder 12/02/2020   Vitamin D  deficiency 12/02/2020   Type 2 diabetes mellitus with hyperglycemia, without long-term current use of insulin  (HCC) 07/20/2020   Hypertension associated with diabetes (HCC) 07/20/2020   Class 3 severe obesity with serious comorbidity and body mass index (BMI) of 50.0 to 59.9 in adult [facility]) 07/20/2020   Cervical radiculopathy 12/03/2018    PCP: Gerard Roys, PA ([facility], [place], [place])  REFERRING PROVIDER: Toribio Kerry, MD  REFERRING DIAG: I89.0  THERAPY DIAG:  Lymphedema, not elsewhere classified  Rationale for Evaluation and Treatment: Rehabilitation  ONSET DATE: 2002 s/p gastric bypass.  SUBJECTIVE:  SUBJECTIVE STATEMENT:  Pt presents to OT for lymphedema care. Pt was last seen on 10/04/23. She is unaccompanied this morning. Pt reports 7/10 R knee pain. Pt brings adjustable compression leggings (CircAid) to clinic for fitting and training. Pt reports she is using her Flexitouch device on her RLE/LQ daily or every other, as directed. My plan is to use it   on my R for a week, then my L for a week.  (09/13/23 FLEXITOUCH TRIAL : Angel Watkins returns to OT to continue Lymphedema treatment to BLE. Pt is accompanied by her husband, Angel Watkins, today. Angel Watkins has been wrapping her and it's helping.  Pt reports 3/10 pins and needles in bilateral thighs. Manufacturer's rep from [employer] due here this morning to assist with a trial of the advanced Flexitouch sequential pneumatic compression device. Angel Watkins commenced OT for Complete  Decongestive Therapy (CDT)  on 07/12/23. Pt has been using the [employer] basic NIMBL sequential pneumatic compression device, or pump, daily for over a month without significant improvement in her condition. The advanced device is medically necessary to treat swelling that extends proximally above the thighs into the hips, buttocks and abdomen that the basic device does not cover.)  (07/12/23 OT Initial Eval: Angel Watkins is referred to Occupational Therapy by Toribio Kerry, MD at [facility] for evaluation and treatment of BLE/BLQ Lipo-lymphedema.Pt reports she first noticed leg swelling after gastric bypass surgery. She states, I lost 100 pounds, but my legs just got bigger instead of going down. Pt reports lower extremity and lower quadrant ( buttocks, abdomen, hips) worsened continues to worsen over time. She is unable to fit off the shelf compression stockings. Diuretics, prescribed in the past, do not reduce swelling. Pt describes lower extremity/ lower quadrant pain and discomfort as heaviness. She rates  this painful sensation as 6-7/10. Pt reports her mother had some symptoms of lipedema, but she is unaware of other relatives that may have had lymphedema or lipedema. Pt denies hx of cellulitis and blood clots. Pt reports her mother had the same body type with a small waist and large, fatty hips and legs.  )  PERTINENT HISTORY:   Anxiety    Arthritis    Back pain    Bilateral swelling of feet    Bulging lumbar disc    and cervical   Diabetes mellitus without complication (HCC)    Fibromyalgia    Hypertension    Joint pain    Palpitations    Sleep apnea    Spinal stenosis   Gastric bypass 2002   Class 3 severe obesity with serious comorbidity and body mass index (BMI) of 50.0 to 59.9 in adult (BMI measured this date 55.6)   Cervical radiculopathy    PAIN:  Are you having pain?  Yes, 5/10 pins and needs, R>L knee  PRECAUTIONS: Other: LYMPHEDEMA PRECAUTIONS  WEIGHT  BEARING RESTRICTIONS: No  FALLS:  Has patient fallen in last 6 months? Yes. Number of falls 1; Just got a new scooter. It caught my shirt and pulled me down. But I wasn't hurt.  LIVING ENVIRONMENT: Lives with: lives with their spouse Lives in: House/apartment Stairs: Yes; External: 3 steps; can reach both Has following equipment at home: Single point cane, shower chair, and hand held shower  OCCUPATION: Advertising account executive full time  LEISURE: enjoys visiting vineyards  HAND DOMINANCE: right   PRIOR LEVEL OF FUNCTION: Independent  PATIENT GOALS: 1. Get swelling under control     2. Limit progression  OBJECTIVE: Note:  Objective measures were completed at Evaluation unless otherwise noted.  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 Non cancer related  INFECTIONS: Denies hx of cellulitis  WOUNDS: Denies hx of leg wounds   BLE COMPARATIVE LIMB VOLUMETRICS : Initial 07/26/23  LANDMARK RIGHT  (dominant)  R LEG (A-D) 7303.9 ml  R THIGH (E-G) ml  R FULL LIMB (A-G) ml  Limb Volume differential (LVD)  LVD measures 1.51 % , L>R  Volume change since last measured %  Volume change since initial V  (Blank rows = not tested)  LANDMARK LEFT   L LEG (A-D) 7193.7 ml  L THIGH (E-G) ml  L FULL LIMB (A-G) ml  Limb Volume differential (LVD)  %  Volume change since initial %  Volume change overall %  (Blank rows = not tested)   RLE COMPARATIVE LIMB VOLUMETRICS :  10/05/23: 10th visit Progress Report  LANDMARK RIGHT  (dominant)  R LEG (A-D) 9040.2 ml  R THIGH (E-G) ml  R FULL LIMB (A-G) ml  Limb Volume differential  (LVD)    Volume change since last measured on 07/26/23  R LEG VOLUME INCREASED by 23.8%  Volume change since initial V  (Blank rows = not tested)  Moderate-Severe, Stage  II, Bilateral Lower Extremity Lipo- Lymphedema 2/2  Severe, Type III Lipedema involving buttocks, abdomen and ankles  Skin  Description Hyper-Keratosis Peau d' Orange Hyperplasia Tight Fibrotic/ Indurated Fatty Doughy Spongy/ boggy    x x  x x x x   Skin dry Flaky WNL Macerated   mild      Color Redness Varicosities Blanching Hemosiderin Stain Mottled    x    x   Odor Malodorous Yeast Fungal infection  WNL      x   Temperature Warm Cool wnl    x     Pitting Edema   1+ 2+ 3+ 4+ Non-pitting         x   Girth Symmetrical Asymmetrical                   Distribution   x  Toes to groin, abdomen, buttocks    Stemmer Sign Positive Negative   +    Lymphorrhea History Of:  Present Absent     x    Wounds History Of Present Absent Venous Arterial Pressure Sheer     x        Signs of Infection Redness Warmth Erythema Acute Swelling Drainage Borders                    Sensation Light Touch Deep pressure Hypersensitivity   Impaired In tact Impaired In tact Absent Impaired    x  x  x    Nails WNL   Fungus nail dystrophy   x     Hair Growth Symmetrical Asymmetrical   x    Skin Creases Base of toes  Ankles   Base of Fingers knees       Abdominal pannus Thigh Lobules  Face/neck    x  x x x     GAIT: Distance walked: >500' Assistive device utilized: Single point cane Level  of assistance: Modified independence Comments: extra time needed  LYMPHEDEMA LIFE IMPACT SCALE (LLIS): INITIAL 57.35%; FINAL: 11/01/23.53% . Pt achieved a 31.72% reduction. GOAL MET (The extent to which lymphedema related problems affected your life during the past week.)                                                                                                                          TREATMENT THIS DATE:  Pt edu  for LE self-care, including wear and care, donning and doffing and gauging compression on New CircAid garment alternatives, progress towards goals to date, POC going forward, LE precautions; Signs/ symptoms of cellulitis and DVT  PATIENT EDUCATION:  Continued Pt/ CG edu for lymphedema self care home program throughout session. Topics include outcome of comparative limb volumetrics- starting limb volume differentials (LVDs), technology and gradient techniques used for short stretch, multilayer compression wrapping, simple self-MLD, therapeutic lymphatic pumping exercises, skin/nail care, LE precautions, compression garment recommendations and specifications, wear and care schedule and compression garment donning / doffing w assistive devices. Discussed progress towards all OT goals since commencing CDT. Discussed detrimental impact of obesity on lower and upper extremity lymphedema over time. Reviewed OT goals for lymphedema care with Pt and discussed progress to date.   Provided Pt education re precautions related to use of advanced sequential pneumatic compression device. Pt verbalized understanding that she should never use device on 2 arms/legs simultaneously and should not use device 2 x on same day to avoid overloading her heart with fluid volume return both at present, and in years to come should her medical condition change. Pt instructed to remove device immediately should she experience atypical SOP, light headedness, or acute pain. Pt instructed to discontinue pump if she suspects, or has any infection, blood clot, cellulitis, the flu, corona virus, etc.  All questions answered to the Pt's satisfaction. Good return. Person educated: Patient and spouse Education method: Explanation, Demonstration, and Handouts Education comprehension: verbalized understanding, returned demonstration, verbal cues required, and needs further education   LYMPHEDEMA SELF-CARE HOME PROGRAM:  BLE lymphatic pumping  there ex using- 1 sets of 10 reps, each exercise in order-  1-2 x daily, bilaterally Simple self MLD 1 x daily , or Flexitouch advanced sequential compression on single lower extremity/lower quadrant 1 x daily for one hour. Do not use pump device more than once daily on more than one limb to limit risk of cardiac overload. Daily skin care to increase hydration, skin mobility and decrease infection risk- can be done during MLD Intensive Phase CDT: Compression wraps 23/7 until garment fitting complete Self-management Phase CDT:  [employer] CircAid Juxtafit Essentials, knee length bilaterally, size XL-WIDE CALF-LONG are medically necessary to provide accessible compression to control lipo-lymphedema of the legs over time,  that are easy to doff and don, and provide a built in gauging system allowing for graduated , adjustable compression.  Custom-made gradient compression garments and HOS devices are medically necessary because they are uniquely sized and  shaped to fit the exact dimensions of the affected extremities, and to provide appropriate medical grade, graduated compression essential for optimally managing chronic, progressive lymphedema. Multiple custom compression garments are needed to ensure proper hygiene to limit infection risk. Custom compression garments should be replaced q 3-6 months When worn consistently for optimal lipo-lymphedema self-management over time. HOS devices, medically necessary to limit fibrosis buildup in tissue, should be replaced q 2 years and PRN when worn out.      ASSESSMENT:  CLINICAL IMPRESSION:   Today marks the transition from modified intensive phase CDT to self-management phase of CDT. Pt has obtained an advanced sequential compression device, the Flexitouch, to assist with home,e lymphedema self-management. Unlike the basic vaso-pneumatic devices, the Flexitouch follows lymphatic anatomy using 32 chambers and includes treatment to involved proximal pathways,  including buttocks, hips and abdomen. She reports she uses the Flexitouch daily as directed. Completed fitting of off the shelf CircAid knee high, Velcro wrap style, compression legging on the R leg. Fit achieved is excellent! Pt is pleased with the fit and comfort. Pt educated edu re LE self-care, including wear and care, donning and doffing and gauging compression on New CircAid garment alternative. Reviewed progress towards goals to date, importance of consistent , daily self care for limiting LE progression, and discussed POC going forward. Reviewed LE precautions, including signs/ symptoms of cellulitis and DVT. Post test LLIS score meets and exceeds goal with a 31.72% reduction. Pt in agreement with plan to obtain additional CircAid for the LLE. She also agrees with plan to reduce Rx frequency to follow up in 8 weeks and PRN.   (09/13/23  [facility] TRIAL: Angel Watkins presents with moderate-severe, stage II, BLE Lipo-lymphedema involving bilateral lower extremities, hips, buttocks and abdomen. Pt has undergone OT for CDT since 07/12/23. Pt has used the [employer] basic NIMBL sequential pneumatic compression device, or pump, for more than 4 weeks with persistent chronic, progressive swelling and associated pain. The basic pump has failed to control swelling and reduce pain. An advanced Flexitouch device is medically necessary to decongest the lower quadrant hips, buttocks and abdomen to facilitate lymphatic decongestion out of the limbs and through proximal pathways above the groin to the thoracic duct.   Moderate-Severe, Stage  II, Bilateral Lower Extremity Lipo- Lymphedema 2/2  Severe, Type III Lipedema involving BLE, buttocks, abdomen and hips DIAGNOSIS: [x]  Lymphedema, not elsewhere classified [I89.0]  []  Hereditary lymphedema [Q 82.0]  []  Postmastectomy lymphedema [I 97.2]   []  Chronic Venous Stasis ulcers [I 87.2] (non-healing despite 6 months of ongoing treatment)  SWELLING  SEVERITY:  International Society of Lymphology clinical classification: []  Stage 0: Latent or subclinical condition where swelling is not yet evident despite impaired lymph transport, subtle alterations in tissue fluid/composition and changes in subjective symptoms.  It may exist months or years before overt edema occurs.  []  Stage I: Early accumulation of fluid relatively high in protein content (e.G., in comparison with "venous" edema) which subsides with limb elevation.  Pitting may occur.  An increase in various types of proliferating cells may also be seen.  [x]  Stage II: Limb elevation alone rarely reduces the tissue swelling and pitting is manifest.  Later in Stage II, the limb may not pit as excess subcutaneous fat and fibrosis develop.  []  Stage III: Lymphostatic elephantiasis where pitting can be absent and trophic skin changes such as acanthosis, alterations in skin character and thickness, further deposition of fat and fibrosis, and warty overgrowths have developed.  SYMPTOMS: Patient exhibits the following symptoms despite conservative therapy: []  Hyperkeratosis  [x]  Hyperplasia  [x]  Hyperpigmentation  []  Skin breakdown with lymphorrhea (skin weeping)  []  Papillomatosis  []  Recurrent cellulitis  [x]  Fibrosis   []  Elephantiasis [x]  Progressive edema [x]  Truncal/abdominal swelling []  Chest/axillary swelling []  Genital swelling [x]  Unable to control swelling [x]  Impaired ROM [x]  Impaired mobility [x]  Pain []  Scarring CONSERVATIVE TREATMENT: Patient has tried conservative treatments:  [x]  Yes []  No  If YES, specify type of conservative treatment and duration of use: Compression garments:    []  <4 weeks   []  1-5 months   []  6-12 months  []  >1 year Bandaging:     []  <4 weeks   [x]  1-5 months   []  6-12 months  []  >1 year Elevation:       []  <4 weeks   [x]  1-5 months   []  6-12 months  []  >1 year  Exercise:         []  <4 weeks   [x]  1-5 months   []  6-12 months  []  >1 year   Complete Decongestive Therapy (CDT) []  <4 weeks   [x]  1-5 months   []  6-12 months  []  >1 year  Instructed on self-manual lymphatic drainage techniques (self-MLD): [x]  Yes []  No Instructed on appropriate skin/nail care practices:    [x]  Yes []  No Evaluation of diet and medications:      [x]  Yes []  No  PNEUMATIC COMPRESSION DEVICE EVALUATION: Patient has tried an [id number] basic pneumatic compression device?  [x]  Yes []  No  If YES: [id number] basic pneumatic compression device [employer] NIMBL was used at 30-400 mmHg for:  []  <4 weeks   [x]  1-5 months   []  6-12 months  []  >1 year  []  Discontinued use prior to 4 weeks due to negative symptoms Was the E0651 basic pneumatic compression device effective in managing the patient's lymphedema?  []  Yes []  No   If NO, an [id number] was not used, the following symptoms demonstrate why it was ineffective or deemed not clinically appropriate to manage the patient's lymphedema: []  E0651 basic pneumatic compression device caused or exacerbated swelling proximal to pump sleeve (top of thigh, genitals, abdomen, hips, buttocks, chest, etc.)  []  Z9348 basic pneumatic compression device was unable to accommodate limb size  []  Patient unable to tolerate or do not recommend E0651 basic pneumatic compression device due to pain/compromised skin integrity (e.G., open wounds/ulcers, scarring, sensitivity)  [x]  Z9348 basic pneumatic compression device did not control swelling or result in clinical improvement  Is an E0652 advanced pneumatic compression device medically necessary to treat the clinical symptoms listed above that prevent effective treatment with the Z9348 basic pneumatic compression device? [x]  Yes []  No   TREATMENT PLAN: Patient to complete or continue the following treatment(s):  Compression Garments and/or Bandaging   Yes [x]  No Exercise       [x]  Yes []  No Elevation      [x]  Yes []  No [id number] Basic Pneumatic Compression Device   []  Yes [x]  No E0652 Advanced  Pneumatic Compression Device  [x]  Yes []  No Professional Lymphedema Treatment (CDT)  [x]  Yes []  No Self-MLD       [x]  Yes []  No   MEASUREMENTS: NIMBL Pre and post trial circumferential measurements reveal increases in circumferences at all anatomical landmarks over time. These data demonstrate a failure of the NIMBL basic sequential pneumatic device for lymphedema control.   Lower extremity measurements (measure the largest circumferential point of the listed area):  Date: 04/20/23 NIMBL Pre trial  Date: 04/20/23 NIMBL Pre trial  [x]   Right Leg  Measurements   [x]   Left Leg Measurements  Inseam 63 cm  Inseam 63 cm  Thigh 87 cm  Thigh 84 cm  Knee  58 cm  Knee 61 cm  Calf 47 cm  Calf 48 cm  Ankle 27 cm  Ankle 28 cm  Hips 160 cm  Hips _________cm  Abdomen/Trunk _________cm  Abdomen/Trunk _________cm    Date:09/13/23 NIMBL Post trial  Date:09/13/23  NIMBL Post trial   [x]   Right Leg  Measurements   [x]   Left Leg Measurements    Inseam _________cm  Inseam _________cm   Thigh 93.5 cm  Thigh 96 cm   Knee 60.5 cm  Knee 61.5 cm   Calf 60 cm  Calf  61 cm   Ankle 34 cm  Ankle 35 cm   Hips 168 cm  Hips _________cm   Abdomen _________cm  Abdomen _________cm     Clinician Signature: TG  (07/12/23 Initial OT eval: Angel Watkins is a 63 yo female presenting with moderate to sever, stage II, lipo-lymphedema 2/2 to Type III LIPEDEMA, Class III Obesity, and suspected venous insufficiency. Her condition involves buttocks, abdomen and bilateral lower extremities. It is exacerbated by joint inflammation, obstructive sleep apnea and hypertension. This patient has a complex constellation of contributing factors.  Lipedema typically affects women and is characterized by painful, symmetrical, excessive deposition of fat below the waist in the legs, thighs and buttocks resulting in a disproportionate waist to hip ratio. It may also include the arms. Unlike lymphedema where swelling extends to the  feet and presents as asymmetrical, the feet are spared and cuff-like accumulation of thickened, fibrotic, fatty tissue is noted at distal legs. Accompanying class III obesity favors the development of lipo-lymphedema, and/ or obesity-induced lymphedema. As lipedema progresses fatty fibrosis accumulates in the subcutis putting pressure on delicate lymphatics resulting in excessive lymph accumulation and progressive lymphedema. The combined condition of lipo-lymphedema is difficult to treat as lipedema does not typically respond well to Complete Decongestive Therapy (CDT), while lymphedema may respond to varying degrees. Well fitting, comfortable compression garments that do not constrict or roll down often provide pain relief and enable improved functional performance. An advanced, sequential, pneumatic , compression device, The Flexitouch from [employer], may reduce pain and discomfort while helping to reduce lymphatic congestion by incorporating anatomically correct lymphatic channels into the device garments.  Extreme obesity can cause lower extremity lymphedema, termed obesity-induced lymphedema (OIL). Obesity-induced lymphedema is secondary lymphedema that may occur once an individual's body mass index (BMI) exceeds 40. Ms Womble-Miller's BMI measures 55.6 today. The risk of lymphatic dysfunction increases with elevated BMI and is almost universal once BMI exceeds 60. Patients with obesity-induced lymphedema also may develop areas of massive, localized lymphedema. Individuals with OIL are in an unfavorable cycle of weight gain and lymphatic injury. As BMI increases lymphedema worsens, ambulation becomes more difficult, and BMI further rises. The fundamental treatment for OIL is weight loss.   Ms. Sharren Watkins will benefit from skilled Occupational Therapy to reduce limb swelling and associated pain, to limit progression and infection risk. BLE lymphedema limits functional performance in all  occupational domains, including functional mobility and ambulation,  basic and instrumental ADLs, productive activities, leisure pursuits, social participation and quality of life. This patient will benefit from modified Intensive and Self Management Phase CDT . In addition to MLD, ther ex, skin care and compression bandaging below the knees,  Pt will be fitted with custom knee length compression stockings paired with off the shelf , Capri length compression leggings. If insurance benefits allow, she will undergo a trial in the clinic with the advanced Flexitouch device in an effort to reduce discomfort and reduce infection risk. Without skilled OT Li-lymphedema will worsen over time and further functional decline is expected. )   OBJECTIVE IMPAIRMENTS: Abnormal gait, decreased balance, decreased knowledge of condition, decreased knowledge of use of DME, decreased mobility, difficulty walking, decreased ROM, decreased strength, increased edema, obesity, pain, and lymphedema -related pain and chronic limb swelling.   ACTIVITY LIMITATIONS: Mobility and functional ambulation limitations:  abnormal gait pattern, carrying, lifting, bending, sitting, standing, squatting, stairs, transfers, and bed mobility Basic and instrumental ADLs (reaching feet and distal legs to groom nails, inspect skin, apply lotion, bathe lower body, difficulty with LB dressing, fitting shoes and socks, impaired sleeping, meal prep, standing to cook, driving, shopping, yard work, house work Paediatric nurse activities: work related activities requiring extended standing, walking, sitting; caring for others Social participation in the community, socializing with others, LE affects my body image completely, interferes w intimacy Leisure pursuits requiring extended standing, walking, sitting  PERSONAL FACTORS: Age, Past/current experiences, Time since onset of injury/illness/exacerbation, and 3+ comorbidities: lipedema, class III Obesity, OSA,  arthritis are also affecting patient's functional outcome.   REHAB POTENTIAL:Pt has difficulty reaching feet, so requires assistance applying and removing multi-layer compression bandaging at home daily between OT visits to achieve  optimal limb volume reduction and functional outcomes. Without consistent, daily caregiver assistance with compression wrapping throughout CDT prognosis is poor. With daily assistance with wrapping, prognosis is fair due to BMI  and Lipedema co-morbidities   EVALUATION COMPLEXITY: Complex  GOALS: Goals reviewed with patient? Yes  SHORT TERM GOALS: Target date: 4th OT Rx visit   Pt will demonstrate understanding of lymphedema precautions and prevention strategies with modified independence using a printed reference to identify at least 5 precautions and discussing how s/he may implement them into daily life to reduce risk of progression with extra time. Baseline:Max A Goal status: GOAL MET  2.  Pt will be able to apply multilayer, knee length, gradient, compression wraps to one leg at a time from toes to below knee with Max caregiver A x 1 to decrease limb volume, to limit infection risk, and to limit lymphedema progression.  Baseline: Dependent Goal status: GOAL MET  LONG TERM GOALS: Target date: 08/08/23 1.Given this patient's Intake score of 57.35 % on the Lymphedema Life Impact Scale (LLIS), patient will experience a reduction of at least 10 points in her perceived level of functional impairment resulting from lymphedema to improve functional performance and quality of life (QOL). Baseline: 57.35 % Goal status:PROGRESSING  2.  Pt will achieve at least a 10% volume reduction in B legs to return limb to typical size and shape, to limit infection risk and LE progression, to decrease pain, to improve function. Baseline: Dependent Goal status: PROGRESSING: To date R LEG volume is significantly increased by 23.8%  3.  Pt will obtain appropriate compression  garments/devices and achieve modified independence (extra time + assistive devices) with donning/doffing to optimize limb volume reductions and limit LE progression over time. Baseline: Dependent Goal status: GOAL MET  During Intensive phase CDT, with modified independence, Pt will achieve at least 85% compliance with all lymphedema self-care home program components, including daily skin care, compression wraps and /or garments, simple self MLD and lymphatic pumping therex to habituate LE self care  protocol  into ADLs for optimal LE self-management over time. Baseline: Dependent Goal status: PROGRESSING- consistent wrapping remains challenging. Pt reports wrapping about 50% of the time.  PLAN:  OT FREQUENCY: 2x/week and PRN  OT DURATION: other: 18 weeks and PRN  PLANNED INTERVENTIONS: Complete Decongestive Therapy (CDT): Manual Lymphatic Drainage (MLD) , Skin care, ther ex, gradient compression97110-Therapeutic exercises, 97530- Therapeutic activity, 97535- Self Care, [id number]- Manual therapy, Patient/Family education, Manual lymph drainage, Compression bandaging, DME instructions, and trial with advanced, sequential, pneumatic, compression device (Tactile Medical Flexitouch) Custom-made gradient compression garments and HOS devices are medically necessary because they are uniquely sized and shaped to fit the exact dimensions of the affected extremities, and to provide appropriate medical grade, graduated compression essential for optimally managing chronic, progressive lymphedema. Multiple custom compression garments are needed to ensure proper hygiene to limit infection risk. Custom compression garments should be replaced q 3-6 months When worn consistently for optimal lipo-lymphedema self-management over time. HOS devices, medically necessary to limit fibrosis buildup in tissue, should be replaced q 2 years and PRN when worn out.   PLAN FOR NEXT SESSION:  Continue MLD and compression wraps as  established. Continue skin care during MLD to reduce infection risk.  Continue teaching lymphedema self-care  Zebedee Dec, MS, OTR/L, CLT-LANA 11/01/23 8:54 AM

## 2023-11-02 ENCOUNTER — Encounter: Payer: Self-pay | Admitting: Nurse Practitioner

## 2023-11-02 ENCOUNTER — Ambulatory Visit: Admitting: Nurse Practitioner

## 2023-11-02 ENCOUNTER — Encounter: Admitting: Psychology

## 2023-11-02 VITALS — BP 128/78 | HR 57 | Temp 98.0°F | Ht 63.25 in | Wt 337.0 lb

## 2023-11-02 DIAGNOSIS — Z1159 Encounter for screening for other viral diseases: Secondary | ICD-10-CM

## 2023-11-02 DIAGNOSIS — E1165 Type 2 diabetes mellitus with hyperglycemia: Secondary | ICD-10-CM

## 2023-11-02 DIAGNOSIS — I152 Hypertension secondary to endocrine disorders: Secondary | ICD-10-CM

## 2023-11-02 DIAGNOSIS — E559 Vitamin D deficiency, unspecified: Secondary | ICD-10-CM | POA: Diagnosis not present

## 2023-11-02 DIAGNOSIS — Z9884 Bariatric surgery status: Secondary | ICD-10-CM | POA: Diagnosis not present

## 2023-11-02 DIAGNOSIS — F419 Anxiety disorder, unspecified: Secondary | ICD-10-CM | POA: Diagnosis not present

## 2023-11-02 DIAGNOSIS — E785 Hyperlipidemia, unspecified: Secondary | ICD-10-CM

## 2023-11-02 DIAGNOSIS — E1169 Type 2 diabetes mellitus with other specified complication: Secondary | ICD-10-CM

## 2023-11-02 DIAGNOSIS — Z7985 Long-term (current) use of injectable non-insulin antidiabetic drugs: Secondary | ICD-10-CM

## 2023-11-02 DIAGNOSIS — F32A Depression, unspecified: Secondary | ICD-10-CM | POA: Diagnosis not present

## 2023-11-02 DIAGNOSIS — Z114 Encounter for screening for human immunodeficiency virus [HIV]: Secondary | ICD-10-CM

## 2023-11-02 DIAGNOSIS — E1159 Type 2 diabetes mellitus with other circulatory complications: Secondary | ICD-10-CM | POA: Diagnosis not present

## 2023-11-02 DIAGNOSIS — G894 Chronic pain syndrome: Secondary | ICD-10-CM | POA: Insufficient documentation

## 2023-11-02 LAB — COMPREHENSIVE METABOLIC PANEL WITH GFR
ALT: 39 U/L — ABNORMAL HIGH (ref 0–35)
AST: 18 U/L (ref 0–37)
Albumin: 3.7 g/dL (ref 3.5–5.2)
Alkaline Phosphatase: 93 U/L (ref 39–117)
BUN: 16 mg/dL (ref 6–23)
CO2: 32 meq/L (ref 19–32)
Calcium: 8.8 mg/dL (ref 8.4–10.5)
Chloride: 103 meq/L (ref 96–112)
Creatinine, Ser: 0.8 mg/dL (ref 0.40–1.20)
GFR: 79.76 mL/min (ref >60.00)
Glucose, Bld: 95 mg/dL (ref 70–99)
Potassium: 3.7 meq/L (ref 3.5–5.1)
Sodium: 142 meq/L (ref 135–145)
Total Bilirubin: 0.6 mg/dL (ref 0.2–1.2)
Total Protein: 5.6 g/dL — ABNORMAL LOW (ref 6.0–8.3)

## 2023-11-02 LAB — MICROALBUMIN / CREATININE URINE RATIO
Creatinine,U: 24.7 mg/dL
Microalb Creat Ratio: UNDETERMINED mg/g (ref 0.0–30.0)
Microalb, Ur: <0.7 mg/dL

## 2023-11-02 LAB — CBC
HCT: 39.9 % (ref 36.0–46.0)
Hemoglobin: 13.2 g/dL (ref 12.0–15.0)
MCHC: 33.2 g/dL (ref 30.0–36.0)
MCV: 92.9 fl (ref 78.0–100.0)
Platelets: 214 K/uL (ref 150.0–400.0)
RBC: 4.29 Mil/uL (ref 3.87–5.11)
RDW: 13.8 % (ref 11.5–15.5)
WBC: 4.2 K/uL (ref 4.0–10.5)

## 2023-11-02 LAB — IBC + FERRITIN
Ferritin: 46.4 ng/mL (ref 10.0–291.0)
Iron: 112 ug/dL (ref 42–145)
Saturation Ratios: 34.5 % (ref 20.0–50.0)
TIBC: 324.8 ug/dL (ref 250.0–450.0)
Transferrin: 232 mg/dL (ref 212.0–360.0)

## 2023-11-02 LAB — LIPID PANEL
Cholesterol: 127 mg/dL (ref 0–200)
HDL: 63.2 mg/dL (ref >39.00)
LDL Cholesterol: 53 mg/dL (ref 0–99)
NonHDL: 64.27
Total CHOL/HDL Ratio: 2
Triglycerides: 57 mg/dL (ref 0.0–149.0)
VLDL: 11.4 mg/dL (ref 0.0–40.0)

## 2023-11-02 LAB — VITAMIN D 25 HYDROXY (VIT D DEFICIENCY, FRACTURES): VITD: 35.88 ng/mL (ref 30.00–100.00)

## 2023-11-02 LAB — VITAMIN B12: Vitamin B-12: >1500 pg/mL — ABNORMAL HIGH (ref 211–911)

## 2023-11-02 LAB — TSH: TSH: 1.28 u[IU]/mL (ref 0.35–5.50)

## 2023-11-02 LAB — HEMOGLOBIN A1C: Hgb A1c MFr Bld: 5.8 % (ref 4.6–6.5)

## 2023-11-02 NOTE — Assessment & Plan Note (Signed)
 Patient currently maintained on semaglutide  1 mg weekly.  Previous to A1c is well-controlled pending A1c today.  Continue medication as prescribed patient denies hypoglycemia.  States she does have supplies at home to check glucose if needed

## 2023-11-02 NOTE — Assessment & Plan Note (Signed)
 Currently maintained on buprenorphine  10 mcg patch weekly and pregabalin 150 mg twice daily.  Patient is followed by [facility].  Continue taking medication as prescribed follow-up with specialist as recommended

## 2023-11-02 NOTE — Assessment & Plan Note (Signed)
History of the same pending vitamin D level today

## 2023-11-02 NOTE — Assessment & Plan Note (Signed)
 Patient currently maintained on irbesartan  75 mg daily and torsemide  20 mg daily.  Blood pressure well-controlled.  Pending labs today continue medication as prescribed

## 2023-11-02 NOTE — Assessment & Plan Note (Signed)
 Currently maintained on atorvastatin  20 mg daily.  Pending lipid panel

## 2023-11-02 NOTE — Patient Instructions (Signed)
 Nice to see you today  I will be in touch with the labs once I review them  Follow up with me in 6 months, sooner if you need me

## 2023-11-02 NOTE — Assessment & Plan Note (Signed)
 Pending vitamin D , iron, B12 levels today

## 2023-11-02 NOTE — Assessment & Plan Note (Signed)
 History of the same.  Patient currently maintained on sertraline  75 mg daily.  Has tried 100 mg with side effects of fatigue.  Patient denies HI/SI/AVH.  We can consider doing BuSpar 5 mg twice daily as patient Dors is more anxiety than depression.  Did go over possible side effects of headache and drowsiness with this.  Patient like to hold off currently.  If she decides that she would like to pursue this she will reach out to the office.

## 2023-11-02 NOTE — Progress Notes (Signed)
 New Patient Office Visit  Subjective    Patient ID: Angel Watkins, female    DOB: [birth year]  Age: 63 y.o. MRN: [id number]  CC:  Chief Complaint  Patient presents with   Establish Care    HPI Recia Sons presents to establish care   HLD: Currently maintained on atorvastatin  20 mg daily  Chronic pain: Maintained on Butrans  10 mcg weekly and pregabalin 150 mg BID. [facility] L[facility] at [facility] [place]   HTN: Currently maintained on irbesartan  75 mg daily and torsemide  20 mg daily. Does not check blood pressure at home.  DM2: Currently maintained on Ozempic  1 mg once a week. Does not  check at home and does have the supplies   Anxiety/depression: Maintained on sertraline  75 mg daily. States that she has more anxiety than anything. Could be realted to external stressors. She is currently ok on her currently regimen  Gastric bypass: Patient currently maintained on iron and oral B12 Upper GI on 09/13/2022 through [facility]. Hiatial hernia noted per patient report   Tdap: 2019 Flu: 10/10/2023 Covid: original series and boosters Pna: 2024 Shingles: completed series  Colonoscopy: 09/21/2018 [facility] polyps removed. Due and has consult that is schuled Mammogram: 02/27/2023 Dexa: too young Pap smear: s/p hysterectomy   Eye exam: 08/15/2023. Wears glasses     Outpatient Encounter Medications as of 11/02/2023  Medication Sig   atorvastatin  (LIPITOR) 20 MG tablet Take 20 mg by mouth daily.   buprenorphine  (BUTRANS ) 10 MCG/HR PTWK Place 1 patch onto the skin once a week.   calcium  citrate (CALCITRATE - DOSED IN MG ELEMENTAL CALCIUM ) 950 (200 Ca) MG tablet Take 200 mg by mouth.   Cholecalciferol (VITAMIN D3) 125 MCG (5000 UT) CAPS Take 1 capsule (5,000 Units total) by mouth daily.   Cyanocobalamin (VITAMIN B-12 PO) Take 1 tablet by mouth daily.    diclofenac  (VOLTAREN ) 75 MG EC tablet Take 75 mg by mouth 2 (two) times daily.   ferrous sulfate 325 (65 FE) MG tablet  Take 325 mg by mouth daily with breakfast.   glucose blood (PRECISION QID TEST) test strip    irbesartan  (AVAPRO ) 75 MG tablet Take 75 mg by mouth daily.   Lancets MISC    omeprazole (PRILOSEC) 20 MG capsule Take 20 mg by mouth daily.   pregabalin (LYRICA) 150 MG capsule Take 150 mg by mouth 2 (two) times daily.   Semaglutide , 1 MG/DOSE, 4 MG/3ML SOPN Inject 1 mg as directed once a week.   sertraline  (ZOLOFT ) 50 MG tablet Take 1.5 tablets (75 mg total) by mouth daily.   torsemide  (DEMADEX ) 20 MG tablet Take 20 mg by mouth daily.    traZODone (DESYREL) 50 MG tablet    No facility-administered encounter medications on file as of 11/02/2023.    Past Medical History:  Diagnosis Date   Anxiety    Arthritis    Back pain    Bilateral swelling of feet    Bulging lumbar disc    and cervical   Diabetes mellitus without complication (HCC)    Fibromyalgia    Hypertension    Joint pain    Lymphedema    Palpitations    Sleep apnea    Spinal stenosis    Wears glasses     Past Surgical History:  Procedure Laterality Date   ABDOMINAL HYSTERECTOMY     ANAL FISSURE REPAIR     ANTERIOR CERVICAL DECOMP/DISCECTOMY FUSION N/A 12/03/2018   Procedure: Cervical five-six  Anterior cervical decompression/discectomy/fusion;  Surgeon:  Cheryle Debby LABOR, MD;  Location: [facility] OR;  Service: Neurosurgery;  Laterality: N/A;   BUNIONECTOMY Left 2009   GASTRIC BYPASS     LUMBAR LAMINECTOMY     RADIOLOGY WITH ANESTHESIA N/A 12/14/2017   Procedure: MRI WITH ANESTHESIA, LUMBAR SPINE WITHOUT CONTRAST, CERVICAL SPINE WITHOUT CONTRAST;  Surgeon: Radiologist, Medication, MD;  Location: [facility] OR;  Service: Radiology;  Laterality: N/A;   RADIOLOGY WITH ANESTHESIA N/A 10/08/2019   Procedure: MRI WITH ANESTHESIA    LUMBAR WITHOUT;  Surgeon: Radiologist, Medication, MD;  Location: [facility] OR;  Service: Radiology;  Laterality: N/A;   right shoulder total replacement  04/2023    Family History  Problem Relation Age of Onset    Hypertension Mother    Cancer Mother        breast and colon   Sleep apnea Mother    Obesity Mother    Diabetes Father    Hypertension Father    Stroke Father    Parkinson's disease Father    Dementia Maternal Grandmother    Parkinson's disease Paternal Grandmother    Dementia Paternal Grandmother     Social History   Socioeconomic History   Marital status: Married    Spouse name: Not on file   Number of children: Not on file   Years of education: Not on file   Highest education level: Not on file  Occupational History   Occupation: Environmental health practitioner  Tobacco Use   Smoking status: Never   Smokeless tobacco: Never  Vaping Use   Vaping status: Never Used  Substance and Sexual Activity   Alcohol use: Not Currently    Alcohol/week: 1.0 standard drink of alcohol    Types: 1 Glasses of wine per week    Comment: occasional   Drug use: No   Sexual activity: Not on file  Other Topics Concern   Not on file  Social History Narrative   Pt lives with family    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   Overall Financial Resource Strain (CARDIA)    Difficulty of Paying Living Expenses: Not hard at all  Food Insecurity: No Food Insecurity (05/12/2023)   Received from [facility]   Hunger Vital Sign    Within the past 12 months, you worried that your food would run out before you got the money to buy more.: Never true    Within the past 12 months, the food you bought just didn't last and you didn't have money to get more.: Never true  Transportation Needs: No Transportation Needs (05/12/2023)   Received from [facility] - Transportation    In the past 12 months, has lack of transportation kept you from medical appointments or from getting medications?: No    Lack of  Transportation (Non-Medical): No  Physical Activity: Not on file  Stress: Not on file  Social Connections: Not on file  Intimate Partner Violence: Not on file    Review of Systems  Constitutional:  Negative for chills and fever.  Respiratory:  Negative for shortness of breath.   Cardiovascular:  Negative for chest pain and leg swelling.  Gastrointestinal:  Positive for constipation. Negative for abdominal pain, blood in stool, diarrhea, nausea and vomiting.       Bm every 3rd day   Genitourinary:  Negative for dysuria  and hematuria.  Neurological:  Positive for tingling. Negative for headaches.  Psychiatric/Behavioral:  Negative for hallucinations and suicidal ideas.         Objective    BP 128/78   Pulse (!) 57   Temp 98 F (36.7 C) (Oral)   Ht 5' 3.25 (1.607 m)   Wt (!) 337 lb (152.9 kg)   SpO2 97%   BMI 59.23 kg/m   Physical Exam Vitals and nursing note reviewed.  Constitutional:      Appearance: Normal appearance.  HENT:     Right Ear: Tympanic membrane, ear canal and external ear normal.     Left Ear: Tympanic membrane, ear canal and external ear normal.     Mouth/Throat:     Mouth: Mucous membranes are moist.     Pharynx: Oropharynx is clear.  Eyes:     Extraocular Movements: Extraocular movements intact.     Pupils: Pupils are equal, round, and reactive to light.  Cardiovascular:     Rate and Rhythm: Normal rate and regular rhythm.     Heart sounds: Normal heart sounds.  Pulmonary:     Effort: Pulmonary effort is normal.     Breath sounds: Normal breath sounds.  Abdominal:     General: Bowel sounds are normal.  Musculoskeletal:     Right lower leg: No edema.     Left lower leg: No edema.  Lymphadenopathy:     Cervical: No cervical adenopathy.  Neurological:     Mental Status: She is alert.    Title   Diabetic Foot Exam - detailed Is there a history of foot ulcer?: No Is there a foot ulcer now?: No Is there swelling?: No Is there elevated  skin temperature?: No Is there abnormal foot shape?: No Is there a claw toe deformity?: No Are the toenails long?: No Are the toenails thick?: No Are the toenails ingrown?: No Is the skin thin, fragile, shiny and hairless?: No Pulse Foot Exam completed.: Yes   Right Dorsalis Pedis: Present Left Dorsalis Pedis: Present     Sensory Foot Exam Completed.: Yes Semmes-Weinstein Monofilament Test + means has sensation and - means no sensation      Image components are not supported.   Image components are not supported. Image components are not supported.  Tuning Fork Comments All 10 sites tested sensation intact bilaterally          Assessment & Plan:   Problem List Items Addressed This Visit       Cardiovascular and Mediastinum   Hypertension associated with diabetes (HCC) - Primary   Patient currently maintained on irbesartan  75 mg daily and torsemide  20 mg daily.  Blood pressure well-controlled.  Pending labs today continue medication as prescribed      Relevant Orders   CBC   Comprehensive metabolic panel with GFR   TSH     Endocrine   Type 2 diabetes mellitus with hyperglycemia, without long-term current use of insulin  (HCC)   Patient currently maintained on semaglutide  1 mg weekly.  Previous to A1c is well-controlled pending A1c today.  Continue medication as prescribed patient denies hypoglycemia.  States she does have supplies at home to check glucose if needed      Relevant Orders   CBC   Comprehensive metabolic panel with GFR   Hemoglobin A1c   Microalbumin / creatinine urine ratio   Lipid panel   Hyperlipidemia associated with type 2 diabetes mellitus (HCC)   Currently maintained on atorvastatin  20 mg daily.  Pending lipid panel      Relevant Orders   Lipid panel     Other   Vitamin D  deficiency   History of the same pending vitamin D  level today      Relevant Orders   VITAMIN D  25 Hydroxy (Vit-D Deficiency, Fractures)   Anxiety and  depression   History of the same.  Patient currently maintained on sertraline  75 mg daily.  Has tried 100 mg with side effects of fatigue.  Patient denies HI/SI/AVH.  We can consider doing BuSpar 5 mg twice daily as patient Dors is more anxiety than depression.  Did go over possible side effects of headache and drowsiness with this.  Patient like to hold off currently.  If she decides that she would like to pursue this she will reach out to the office.      Relevant Medications   traZODone (DESYREL) 50 MG tablet   Other Relevant Orders   TSH   VITAMIN D  25 Hydroxy (Vit-D Deficiency, Fractures)   Morbid obesity (HCC)   History of the same patient is being followed by healthy weight wellness continue working lifestyle modifications and following with healthy weight wellness clinic as recommended      History of Roux-en-Y gastric bypass   Pending vitamin D , iron, B12 levels today      Relevant Orders   IBC + Ferritin   Vitamin B12   VITAMIN D  25 Hydroxy (Vit-D Deficiency, Fractures)   Chronic pain syndrome   Currently maintained on buprenorphine  10 mcg patch weekly and pregabalin 150 mg twice daily.  Patient is followed by [facility].  Continue taking medication as prescribed follow-up with specialist as recommended      Relevant Medications   pregabalin (LYRICA) 150 MG capsule   traZODone (DESYREL) 50 MG tablet   Other Visit Diagnoses       Encounter for hepatitis C screening test for low risk patient       Relevant Orders   Hepatitis C antibody     Encounter for screening for HIV       Relevant Orders   HIV antibody (with reflex)       Return in about 6 months (around 05/02/2024) for DM recheck.   Adina Crandall, NP

## 2023-11-02 NOTE — Assessment & Plan Note (Signed)
 History of the same patient is being followed by healthy weight wellness continue working lifestyle modifications and following with healthy weight wellness clinic as recommended

## 2023-11-03 LAB — HIV ANTIBODY (ROUTINE TESTING W REFLEX)
HIV 1&2 Ab, 4th Generation: NONREACTIVE
HIV FINAL INTERPRETATION: NEGATIVE

## 2023-11-03 LAB — HEPATITIS C ANTIBODY: Hepatitis C Ab: NONREACTIVE

## 2023-11-06 ENCOUNTER — Ambulatory Visit: Payer: Self-pay | Admitting: Nurse Practitioner

## 2023-11-06 ENCOUNTER — Telehealth: Payer: Self-pay | Admitting: *Deleted

## 2023-11-06 NOTE — Telephone Encounter (Signed)
 She is scheduled for anesthesia at [facility] on 10/9, giving pod the H&P paperwork to fill out and fax back.

## 2023-11-06 NOTE — Telephone Encounter (Signed)
 Spoke with pt and she is able to come in 1530  [phone] with Greig Forbes NP for H and P required by [facility] for pts MRI 's scheduled on the 11/09/2023 under general anesthesia. Form given to pod 1 , Amy in box.

## 2023-11-06 NOTE — Telephone Encounter (Signed)
 Appt made with amy NP tomorrow for H and P re: her MRI's.

## 2023-11-07 ENCOUNTER — Encounter: Payer: Self-pay | Admitting: Family Medicine

## 2023-11-07 ENCOUNTER — Telehealth: Payer: Self-pay | Admitting: Neurology

## 2023-11-07 ENCOUNTER — Ambulatory Visit (INDEPENDENT_AMBULATORY_CARE_PROVIDER_SITE_OTHER): Admitting: Family Medicine

## 2023-11-07 VITALS — BP 118/83 | HR 76 | Ht 64.0 in | Wt 355.5 lb

## 2023-11-07 DIAGNOSIS — G4733 Obstructive sleep apnea (adult) (pediatric): Secondary | ICD-10-CM | POA: Diagnosis not present

## 2023-11-07 DIAGNOSIS — R419 Unspecified symptoms and signs involving cognitive functions and awareness: Secondary | ICD-10-CM

## 2023-11-07 NOTE — Patient Instructions (Addendum)
 Below is our plan:  We will complete medical forms to proceed with general anesthesia to obtain MRI brain.   Please make sure you are staying well hydrated. I recommend 50-60 ounces daily. Well balanced diet and regular exercise encouraged. Consistent sleep schedule with 6-8 hours recommended.   Please continue follow up with care team as directed.   Follow up with Dr Buck pending MRI results   You may receive a survey regarding today's visit. I encourage you to leave honest feed back as I do use this information to improve patient care. Thank you for seeing me today!   Management of Memory Problems   There are some general things you can do to help manage your memory problems.  Your memory may not in fact recover, but by using techniques and strategies you will be able to manage your memory difficulties better.   1)  Establish a routine. Try to establish and then stick to a regular routine.  By doing this, you will get used to what to expect and you will reduce the need to rely on your memory.  Also, try to do things at the same time of day, such as taking your medication or checking your calendar first thing in the morning. Think about think that you can do as a part of a regular routine and make a list.  Then enter them into a daily planner to remind you.  This will help you establish a routine.   2)  Organize your environment. Organize your environment so that it is uncluttered.  Decrease visual stimulation.  Place everyday items such as keys or cell phone in the same place every day (ie.  Basket next to front door) Use post it notes with a brief message to yourself (ie. Turn off light, lock the door) Use labels to indicate where things go (ie. Which cupboards are for food, dishes, etc.) Keep a notepad and pen by the telephone to take messages   3)  Memory Aids A diary or journal/notebook/daily planner Making a list (shopping list, chore list, to do list that needs to be done) Using  an alarm as a reminder (kitchen timer or cell phone alarm) Using cell phone to store information (Notes, Calendar, Reminders) Calendar/White board placed in a prominent position Post-it notes   In order for memory aids to be useful, you need to have good habits.  It's no good remembering to make a note in your journal if you don't remember to look in it.  Try setting aside a certain time of day to look in journal.   4)  Improving mood and managing fatigue. There may be other factors that contribute to memory difficulties.  Factors, such as anxiety, depression and tiredness can affect memory. Regular gentle exercise can help improve your mood and give you more energy. Exercise: there are short videos created by the [employer] on Health specially for older adults: https://bit.ly/2I30q97.  Mediterranean diet: which emphasizes fruits, vegetables, whole grains, legumes, fish, and other seafood; unsaturated fats such as olive oils; and low amounts of red meat, eggs, and sweets. A variation of this, called MIND (Mediterranean-DASH Intervention for Neurodegenerative Delay) incorporates the DASH (Dietary Approaches to Stop Hypertension) diet, which has been shown to lower high blood pressure, a risk factor for Alzheimer's disease. More information at: ExitMarketing.de.  Aerobic exercise that improve heart health is also good for the mind.  [employer] on Aging have short videos for exercises that you can do at home:  BlindWorkshop.com.pt Simple relaxation techniques may help relieve symptoms of anxiety Try to get back to completing activities or hobbies you enjoyed doing in the past. Learn to pace yourself through activities to decrease fatigue. Find out about some local support groups where you can share experiences with others. Try and achieve 7-8 hours of sleep at night.   Tasks to improve attention/working  memory 1. Good sleep hygiene (7-8 hrs of sleep) 2. Learning a new skill (Painting, Carpentry, Pottery, new language, Knitting). 3.Cognitive exercises (keep a daily journal, Puzzles) 4. Physical exercise and training  (30 min/day X 4 days week) 5. Being on Antidepressant if needed 6.Yoga, Meditation, Tai Chi 7. Decrease alcohol intake 8.Have a clear schedule and structure in daily routine   MIND Diet: The Mediterranean-DASH Diet Intervention for Neurodegenerative Delay, or MIND diet, targets the health of the aging brain. Research participants with the highest MIND diet scores had a significantly slower rate of cognitive decline compared with those with the lowest scores. The effects of the MIND diet on cognition showed greater effects than either the Mediterranean or the DASH diet alone.   The healthy items the MIND diet guidelines suggest include:   3+ servings a day of whole grains 1+ servings a day of vegetables (other than green leafy) 6+ servings a week of green leafy vegetables 5+ servings a week of nuts 4+ meals a week of beans 2+ servings a week of berries 2+ meals a week of poultry 1+ meals a week of fish Mainly olive oil if added fat is used   The unhealthy items, which are higher in saturated and trans fat, include: Less than 5 servings a week of pastries and sweets Less than 4 servings a week of red meat (including beef, pork, lamb, and products made from these meats) Less than one serving a week of cheese and fried foods Less than 1 tablespoon a day of butter/stick margarine

## 2023-11-07 NOTE — Progress Notes (Signed)
 Chief Complaint  Patient presents with   Follow-up    Pt in room 1. Alone. Here need H&P form filled out for MRI scheduled on 11/09/23. Pt does not use/ have cpap machine.     HISTORY OF PRESENT ILLNESS:  11/07/23 ALL:  Angel Watkins is a 63 y.o. female here today for follow up. She was seen by Angel Watkins 07/21/2023 for concerns of memory loss. PSG showed mild OSA. Weight management preferred by patient. MRI ordered and scheduled for tomorrow. She presents today for H&P to undergo general anesthesia. She was seen by Angel Watkins but has not had testing due to provider not being in network. She denies any specific changes with memory. She continues to drive without difficulty. She manages her home and finances. Able to manage medications independently.    HISTORY (copied from Angel Watkins previous note)   Dear Angel Watkins,   I saw your patient, Angel Watkins, upon your kind request in my neurologic clinic today for initial consultation of her memory loss.  The patient is unaccompanied today. As you know, Angel Watkins is a 63 year old female with an underlying medical history of arthritis, back pain, diabetes, lower extremity swelling, lymphedema, vitamin D  deficiency, fibromyalgia, hypertension, anxiety, palpitations, spinal stenosis, sleep apnea, and morbid obesity with a BMI of over 50, status post gastric bypass surgery many years ago, who reports an intermittent had tremor in both hands, right more than left for over 1 year. Tremor is more noticeable when she holds something such as a cup of coffee.  She reports having fallen, she has fallen trying to get down some steps.  She usually uses a cane, has not used a walker yet.  She has a single-point cane which she has been using for the past 2 years.  She also reports difficulty with her short-term memory and name recall for the past 1+ year.  She reports that she has not had any workup through your office for her cognitive concerns.    She reports a family history of Parkinson's disease or parkinsonism affecting her father and paternal grandmother.  She had blood work with Angel. Janese recently and I reviewed test results in her electronic chart.  She was diagnosed with sleep apnea many years ago and had a CPAP machine but then had another sleep study several years ago and was told she no longer a had sleep apnea.  She does have trouble sleeping at night and takes trazodone, 50 mg at bedtime.  She has nocturia about once per average night and reports occasional morning headaches.  She has a history of snoring occasionally.  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  She has had some difficulty sequencing work but has not had any trouble at work.  She still drives, she has not had any trouble navigating.  She reports that she will need sedation for an MRI as she has severe claustrophobia. I reviewed your office note from 03/16/2023.  She had blood work through your office at the time including CBC, CMP, lipid panel, TSH, and vitamin D . She had a remote head CT without contrast and cervical spine CT without contrast on 06/01/2014 and I reviewed the results:   IMPRESSION: 1.  No evidence for acute intracranial abnormality. 2. Mid cervical spondylosis. No evidence for acute cervical spine abnormality. 3. Mid cervical spinal stenosis most notable at C5-6.   She follows with pain management through [facility].  She  is on [place]  patch.   REVIEW OF SYSTEMS: Out of a complete 14 system review of symptoms, the patient complains only of the following symptoms, memory loss, back pain and all other reviewed systems are negative.   ALLERGIES: Allergies  Allergen Reactions   Adhesive [Tape] Itching and Other (See Comments)    Bruising    Furosemide Other (See Comments)    Furosemide 40mg  and 80mg  cause leg cramps   Grapeseed Extract [Nutritional Supplements] Itching    muscadine  grapes cause facial itching   Mobic [Meloxicam] Swelling        Morphine Other (See Comments)    Headache   Percocet [Oxycodone -Acetaminophen ] Itching   Amlodipine Palpitations        Penicillins Itching and Rash    Has patient had a PCN reaction causing immediate rash, facial/tongue/throat swelling, SOB or lightheadedness with hypotension: No Has patient had a PCN reaction causing severe rash involving mucus membranes or skin necrosis: Yes Has patient had a PCN reaction that required hospitalization: Yes Has patient had a PCN reaction occurring within the last 10 years: No If all of the above answers are NO, then may proceed with Cephalosporin use.      HOME MEDICATIONS: Outpatient Medications Prior to Visit  Medication Sig Dispense Refill   acetaminophen  (TYLENOL ) 650 MG CR tablet Take 1,300 mg by mouth every 8 (eight) hours as needed for pain.     atorvastatin  (LIPITOR) 20 MG tablet Take 20 mg by mouth daily.     buprenorphine  (BUTRANS ) 10 MCG/HR PTWK Place 1 patch onto the skin once a week.     calcium  citrate (CALCITRATE - DOSED IN MG ELEMENTAL CALCIUM ) 950 (200 Ca) MG tablet Take 200 mg by mouth daily.     Cholecalciferol (VITAMIN D3) 125 MCG (5000 UT) CAPS Take 1 capsule (5,000 Units total) by mouth daily.     cyanocobalamin (VITAMIN B12) 1000 MCG tablet Take 1,000 mcg by mouth every other day.     diclofenac  (VOLTAREN ) 75 MG EC tablet Take 75 mg by mouth 2 (two) times daily.     ferrous sulfate 325 (65 FE) MG tablet Take 325 mg by mouth daily with breakfast.     glucose blood (PRECISION QID TEST) test strip      irbesartan  (AVAPRO ) 75 MG tablet Take 75 mg by mouth daily.     Lancets MISC      omeprazole (PRILOSEC) 20 MG capsule Take 20 mg by mouth daily.     pregabalin (LYRICA) 150 MG capsule Take 150 mg by mouth 2 (two) times daily.     Semaglutide , 1 MG/DOSE, 4 MG/3ML SOPN Inject 1 mg as directed once a week. 9 mL 0   sertraline  (ZOLOFT ) 50 MG tablet Take 1.5 tablets  (75 mg total) by mouth daily. 180 tablet 0   torsemide  (DEMADEX ) 20 MG tablet Take 20 mg by mouth daily.      traZODone (DESYREL) 50 MG tablet Take 50 mg by mouth at bedtime as needed for sleep.     No facility-administered medications prior to visit.     PAST MEDICAL HISTORY: Past Medical History:  Diagnosis Date   Anxiety    Arthritis    Back pain    Bilateral swelling of feet    Bulging lumbar disc    and cervical   Diabetes mellitus without complication (HCC)    Fibromyalgia    Hypertension    Joint pain    Lymphedema    Palpitations  Sleep apnea    Spinal stenosis    Wears glasses      PAST SURGICAL HISTORY: Past Surgical History:  Procedure Laterality Date   ABDOMINAL HYSTERECTOMY     ANAL FISSURE REPAIR     ANTERIOR CERVICAL DECOMP/DISCECTOMY FUSION N/A 12/03/2018   Procedure: Cervical five-six  Anterior cervical decompression/discectomy/fusion;  Surgeon: Cheryle Debby LABOR, MD;  Location: [facility] OR;  Service: Neurosurgery;  Laterality: N/A;   BUNIONECTOMY Left 2009   GASTRIC BYPASS     LUMBAR LAMINECTOMY     RADIOLOGY WITH ANESTHESIA N/A 12/14/2017   Procedure: MRI WITH ANESTHESIA, LUMBAR SPINE WITHOUT CONTRAST, CERVICAL SPINE WITHOUT CONTRAST;  Surgeon: Radiologist, Medication, MD;  Location: [facility] OR;  Service: Radiology;  Laterality: N/A;   RADIOLOGY WITH ANESTHESIA N/A 10/08/2019   Procedure: MRI WITH ANESTHESIA    LUMBAR WITHOUT;  Surgeon: Radiologist, Medication, MD;  Location: [facility] OR;  Service: Radiology;  Laterality: N/A;   right shoulder total replacement  04/2023     FAMILY HISTORY: Family History  Problem Relation Age of Onset   Hypertension Mother    Cancer Mother        breast and colon   Sleep apnea Mother    Obesity Mother    Diabetes Father    Hypertension Father    Stroke Father    Parkinson's disease Father    Dementia Maternal Grandmother    Parkinson's disease Paternal Grandmother    Dementia Paternal Grandmother      SOCIAL  HISTORY: Social History   Socioeconomic History   Marital status: Married    Spouse name: Not on file   Number of children: Not on file   Years of education: Not on file   Highest education level: Not on file  Occupational History   Occupation: Environmental health practitioner  Tobacco Use   Smoking status: Never   Smokeless tobacco: Never  Vaping Use   Vaping status: Never Used  Substance and Sexual Activity   Alcohol use: Not Currently    Alcohol/week: 1.0 standard drink of alcohol    Types: 1 Glasses of wine per week    Comment: occasional   Drug use: No   Sexual activity: Not on file  Other Topics Concern   Not on file  Social History Narrative   Pt lives with family    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   Overall Financial Resource Strain (CARDIA)    Difficulty of Paying Living Expenses: Not hard at all  Food Insecurity: No Food Insecurity (05/12/2023)   Received from [facility]   Hunger Vital Sign    Within the past 12 months, you worried that your food would run out before you got the money to buy more.: Never true    Within the past 12 months, the food you bought just didn't last and you didn't have money to get more.: Never true  Transportation Needs: No Transportation Needs (05/12/2023)   Received from [facility] - Transportation    In the past 12 months, has lack of transportation kept you from medical appointments or from getting medications?: No    Lack of Transportation (Non-Medical): No  Physical Activity: Not on file  Stress: Not on file  Social Connections: Not on file  Intimate Partner Violence: Not on file  PHYSICAL EXAM  Vitals:   11/07/23 1535  BP: 118/83  Pulse: 76  SpO2: 98%  Weight: (!) 355 lb 8 oz (161.3 kg)  Height: 5' 4 (1.626 m)    Body mass index is 61.02 kg/m.  Generalized: Well developed, in no acute distress  Cardiology: normal rate and rhythm, no murmur auscultated  Respiratory: clear to auscultation bilaterally    Neurological examination  Mentation: Alert oriented to time, place, history taking. Follows all commands speech and language fluent Cranial nerve II-XII: Pupils were equal round reactive to light. Extraocular movements were full, visual field were full on confrontational test. Facial sensation and strength were normal. Uvula tongue midline. Head turning and shoulder shrug  were normal and symmetric. Motor: The motor testing reveals 5 over 5 strength of all 4 extremities. Good symmetric motor tone is noted throughout.   Gait and station: Gait is normal.    DIAGNOSTIC DATA (LABS, IMAGING, TESTING) - I reviewed patient records, labs, notes, testing and imaging myself where available.  Lab Results  Component Value Date   WBC 4.2 11/02/2023   HGB 13.2 11/02/2023   HCT 39.9 11/02/2023   MCV 92.9 11/02/2023   PLT 214.0 11/02/2023      Component Value Date/Time   NA 142 11/02/2023 0937   NA 143 05/24/2023 0856   K 3.7 11/02/2023 0937   CL 103 11/02/2023 0937   CO2 32 11/02/2023 0937   GLUCOSE 95 11/02/2023 0937   BUN 16 11/02/2023 0937   BUN 22 05/24/2023 0856   CREATININE 0.80 11/02/2023 0937   CALCIUM  8.8 11/02/2023 0937   PROT 5.6 (L) 11/02/2023 0937   PROT 5.5 (L) 05/24/2023 0856   ALBUMIN 3.7 11/02/2023 0937   ALBUMIN 3.7 (L) 05/24/2023 0856   AST 18 11/02/2023 0937   ALT 39 (H) 11/02/2023 0937   ALKPHOS 93 11/02/2023 0937   BILITOT 0.6 11/02/2023 0937   BILITOT 0.4 05/24/2023 0856   GFRNONAA >60 10/08/2019 0701   GFRAA >60 10/08/2019 0701   Lab Results  Component Value Date   CHOL 127 11/02/2023   HDL 63.20 11/02/2023   LDLCALC 53 11/02/2023   TRIG 57.0 11/02/2023   CHOLHDL 2 11/02/2023   Lab Results  Component Value Date   HGBA1C 5.8 11/02/2023   Lab Results   Component Value Date   VITAMINB12 >1500 (H) 11/02/2023   Lab Results  Component Value Date   TSH 1.28 11/02/2023        No data to display              07/21/2023   11:17 AM  Montreal Cognitive Assessment   Visuospatial/ Executive (0/5) 4  Naming (0/3) 2  Attention: Read list of digits (0/2) 2  Attention: Read list of letters (0/1) 1  Attention: Serial 7 subtraction starting at 100 (0/3) 2  Language: Repeat phrase (0/2) 2  Language : Fluency (0/1) 0  Abstraction (0/2) 2  Delayed Recall (0/5) 0  Orientation (0/6) 5  Total 20     ASSESSMENT AND PLAN  63 y.o. year old female  has a past medical history of Anxiety, Arthritis, Back pain, Bilateral swelling of feet, Bulging lumbar disc, Diabetes mellitus without complication (HCC), Fibromyalgia, Hypertension, Joint pain, Lymphedema, Palpitations, Sleep apnea, Spinal stenosis, and Wears glasses. here with    Cognitive complaints  OSA (obstructive sleep apnea)  Ozzie Remmers is scheduled for a MRI brain 11/09/2023. Neurologic history and physical shows no obvious contraindications to procedure. Barring any unforseen circumstances,  she should do well with general anesthesia. Healthy lifestyle habits encouraged. She will follow up with PCP as directed. She will return to see Angel Watkins pending MRI results. She verbalizes understanding and agreement with this plan.   No orders of the defined types were placed in this encounter.    No orders of the defined types were placed in this encounter.  I personally spent a total of 30 minutes in the care of the patient today including preparing to see the patient, getting/reviewing separately obtained history, performing a medically appropriate exam/evaluation, counseling and educating, placing orders, documenting clinical information in the EHR, independently interpreting results, communicating results, and coordinating care.   Greig Forbes, MSN, FNP-C 11/07/2023, 4:13  PM  [facility] [address], [address] [place], [place] [address] [phone]

## 2023-11-07 NOTE — H&P (View-Only) (Signed)
 Chief Complaint  Patient presents with   Follow-up    Pt in room 1. Alone. Here need H&P form filled out for MRI scheduled on 11/09/23. Pt does not use/ have cpap machine.     HISTORY OF PRESENT ILLNESS:  11/07/23 ALL:  Angel Watkins is a 63 y.o. female here today for follow up. She was seen by Dr Buck 07/21/2023 for concerns of memory loss. PSG showed mild OSA. Weight management preferred by patient. MRI ordered and scheduled for tomorrow. She presents today for H&P to undergo general anesthesia. She was seen by Dr Hayden but has not had testing due to provider not being in network. She denies any specific changes with memory. She continues to drive without difficulty. She manages her home and finances. Able to manage medications independently.    HISTORY (copied from Dr Obie previous note)   Dear Angel Watkins,   I saw your patient, Angel Watkins, upon your kind request in my neurologic clinic today for initial consultation of her memory loss.  The patient is unaccompanied today. As you know, Angel Watkins is a 63 year old female with an underlying medical history of arthritis, back pain, diabetes, lower extremity swelling, lymphedema, vitamin D  deficiency, fibromyalgia, hypertension, anxiety, palpitations, spinal stenosis, sleep apnea, and morbid obesity with a BMI of over 50, status post gastric bypass surgery many years ago, who reports an intermittent had tremor in both hands, right more than left for over 1 year. Tremor is more noticeable when she holds something such as a cup of coffee.  She reports having fallen, she has fallen trying to get down some steps.  She usually uses a cane, has not used a walker yet.  She has a single-point cane which she has been using for the past 2 years.  She also reports difficulty with her short-term memory and name recall for the past 1+ year.  She reports that she has not had any workup through your office for her cognitive concerns.    She reports a family history of Parkinson's disease or parkinsonism affecting her father and paternal grandmother.  She had blood work with Dr. Janese recently and I reviewed test results in her electronic chart.  She was diagnosed with sleep apnea many years ago and had a CPAP machine but then had another sleep study several years ago and was told she no longer a had sleep apnea.  She does have trouble sleeping at night and takes trazodone, 50 mg at bedtime.  She has nocturia about once per average night and reports occasional morning headaches.  She has a history of snoring occasionally.  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  She has had some difficulty sequencing work but has not had any trouble at work.  She still drives, she has not had any trouble navigating.  She reports that she will need sedation for an MRI as she has severe claustrophobia. I reviewed your office note from 03/16/2023.  She had blood work through your office at the time including CBC, CMP, lipid panel, TSH, and vitamin D . She had a remote head CT without contrast and cervical spine CT without contrast on 06/01/2014 and I reviewed the results:   IMPRESSION: 1.  No evidence for acute intracranial abnormality. 2. Mid cervical spondylosis. No evidence for acute cervical spine abnormality. 3. Mid cervical spinal stenosis most notable at C5-6.   She follows with pain management through [facility].  She  is on [place]  patch.   REVIEW OF SYSTEMS: Out of a complete 14 system review of symptoms, the patient complains only of the following symptoms, memory loss, back pain and all other reviewed systems are negative.   ALLERGIES: Allergies  Allergen Reactions   Adhesive [Tape] Itching and Other (See Comments)    Bruising    Furosemide Other (See Comments)    Furosemide 40mg  and 80mg  cause leg cramps   Grapeseed Extract [Nutritional Supplements] Itching    muscadine  grapes cause facial itching   Mobic [Meloxicam] Swelling        Morphine Other (See Comments)    Headache   Percocet [Oxycodone -Acetaminophen ] Itching   Amlodipine Palpitations        Penicillins Itching and Rash    Has patient had a PCN reaction causing immediate rash, facial/tongue/throat swelling, SOB or lightheadedness with hypotension: No Has patient had a PCN reaction causing severe rash involving mucus membranes or skin necrosis: Yes Has patient had a PCN reaction that required hospitalization: Yes Has patient had a PCN reaction occurring within the last 10 years: No If all of the above answers are NO, then may proceed with Cephalosporin use.      HOME MEDICATIONS: Outpatient Medications Prior to Visit  Medication Sig Dispense Refill   acetaminophen  (TYLENOL ) 650 MG CR tablet Take 1,300 mg by mouth every 8 (eight) hours as needed for pain.     atorvastatin  (LIPITOR) 20 MG tablet Take 20 mg by mouth daily.     buprenorphine  (BUTRANS ) 10 MCG/HR PTWK Place 1 patch onto the skin once a week.     calcium  citrate (CALCITRATE - DOSED IN MG ELEMENTAL CALCIUM ) 950 (200 Ca) MG tablet Take 200 mg by mouth daily.     Cholecalciferol (VITAMIN D3) 125 MCG (5000 UT) CAPS Take 1 capsule (5,000 Units total) by mouth daily.     cyanocobalamin (VITAMIN B12) 1000 MCG tablet Take 1,000 mcg by mouth every other day.     diclofenac  (VOLTAREN ) 75 MG EC tablet Take 75 mg by mouth 2 (two) times daily.     ferrous sulfate 325 (65 FE) MG tablet Take 325 mg by mouth daily with breakfast.     glucose blood (PRECISION QID TEST) test strip      irbesartan  (AVAPRO ) 75 MG tablet Take 75 mg by mouth daily.     Lancets MISC      omeprazole (PRILOSEC) 20 MG capsule Take 20 mg by mouth daily.     pregabalin (LYRICA) 150 MG capsule Take 150 mg by mouth 2 (two) times daily.     Semaglutide , 1 MG/DOSE, 4 MG/3ML SOPN Inject 1 mg as directed once a week. 9 mL 0   sertraline  (ZOLOFT ) 50 MG tablet Take 1.5 tablets  (75 mg total) by mouth daily. 180 tablet 0   torsemide  (DEMADEX ) 20 MG tablet Take 20 mg by mouth daily.      traZODone (DESYREL) 50 MG tablet Take 50 mg by mouth at bedtime as needed for sleep.     No facility-administered medications prior to visit.     PAST MEDICAL HISTORY: Past Medical History:  Diagnosis Date   Anxiety    Arthritis    Back pain    Bilateral swelling of feet    Bulging lumbar disc    and cervical   Diabetes mellitus without complication (HCC)    Fibromyalgia    Hypertension    Joint pain    Lymphedema    Palpitations  Sleep apnea    Spinal stenosis    Wears glasses      PAST SURGICAL HISTORY: Past Surgical History:  Procedure Laterality Date   ABDOMINAL HYSTERECTOMY     ANAL FISSURE REPAIR     ANTERIOR CERVICAL DECOMP/DISCECTOMY FUSION N/A 12/03/2018   Procedure: Cervical five-six  Anterior cervical decompression/discectomy/fusion;  Surgeon: Cheryle Debby LABOR, MD;  Location: [facility] OR;  Service: Neurosurgery;  Laterality: N/A;   BUNIONECTOMY Left 2009   GASTRIC BYPASS     LUMBAR LAMINECTOMY     RADIOLOGY WITH ANESTHESIA N/A 12/14/2017   Procedure: MRI WITH ANESTHESIA, LUMBAR SPINE WITHOUT CONTRAST, CERVICAL SPINE WITHOUT CONTRAST;  Surgeon: Radiologist, Medication, MD;  Location: [facility] OR;  Service: Radiology;  Laterality: N/A;   RADIOLOGY WITH ANESTHESIA N/A 10/08/2019   Procedure: MRI WITH ANESTHESIA    LUMBAR WITHOUT;  Surgeon: Radiologist, Medication, MD;  Location: [facility] OR;  Service: Radiology;  Laterality: N/A;   right shoulder total replacement  04/2023     FAMILY HISTORY: Family History  Problem Relation Age of Onset   Hypertension Mother    Cancer Mother        breast and colon   Sleep apnea Mother    Obesity Mother    Diabetes Father    Hypertension Father    Stroke Father    Parkinson's disease Father    Dementia Maternal Grandmother    Parkinson's disease Paternal Grandmother    Dementia Paternal Grandmother      SOCIAL  HISTORY: Social History   Socioeconomic History   Marital status: Married    Spouse name: Not on file   Number of children: Not on file   Years of education: Not on file   Highest education level: Not on file  Occupational History   Occupation: Environmental health practitioner  Tobacco Use   Smoking status: Never   Smokeless tobacco: Never  Vaping Use   Vaping status: Never Used  Substance and Sexual Activity   Alcohol use: Not Currently    Alcohol/week: 1.0 standard drink of alcohol    Types: 1 Glasses of wine per week    Comment: occasional   Drug use: No   Sexual activity: Not on file  Other Topics Concern   Not on file  Social History Narrative   Pt lives with family    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   Overall Financial Resource Strain (CARDIA)    Difficulty of Paying Living Expenses: Not hard at all  Food Insecurity: No Food Insecurity (05/12/2023)   Received from [facility]   Hunger Vital Sign    Within the past 12 months, you worried that your food would run out before you got the money to buy more.: Never true    Within the past 12 months, the food you bought just didn't last and you didn't have money to get more.: Never true  Transportation Needs: No Transportation Needs (05/12/2023)   Received from [facility] - Transportation    In the past 12 months, has lack of transportation kept you from medical appointments or from getting medications?: No    Lack of Transportation (Non-Medical): No  Physical Activity: Not on file  Stress: Not on file  Social Connections: Not on file  Intimate Partner Violence: Not on file  PHYSICAL EXAM  Vitals:   11/07/23 1535  BP: 118/83  Pulse: 76  SpO2: 98%  Weight: (!) 355 lb 8 oz (161.3 kg)  Height: 5' 4 (1.626 m)    Body mass index is 61.02 kg/m.  Generalized: Well developed, in no acute distress  Cardiology: normal rate and rhythm, no murmur auscultated  Respiratory: clear to auscultation bilaterally    Neurological examination  Mentation: Alert oriented to time, place, history taking. Follows all commands speech and language fluent Cranial nerve II-XII: Pupils were equal round reactive to light. Extraocular movements were full, visual field were full on confrontational test. Facial sensation and strength were normal. Uvula tongue midline. Head turning and shoulder shrug  were normal and symmetric. Motor: The motor testing reveals 5 over 5 strength of all 4 extremities. Good symmetric motor tone is noted throughout.   Gait and station: Gait is normal.    DIAGNOSTIC DATA (LABS, IMAGING, TESTING) - I reviewed patient records, labs, notes, testing and imaging myself where available.  Lab Results  Component Value Date   WBC 4.2 11/02/2023   HGB 13.2 11/02/2023   HCT 39.9 11/02/2023   MCV 92.9 11/02/2023   PLT 214.0 11/02/2023      Component Value Date/Time   NA 142 11/02/2023 0937   NA 143 05/24/2023 0856   K 3.7 11/02/2023 0937   CL 103 11/02/2023 0937   CO2 32 11/02/2023 0937   GLUCOSE 95 11/02/2023 0937   BUN 16 11/02/2023 0937   BUN 22 05/24/2023 0856   CREATININE 0.80 11/02/2023 0937   CALCIUM  8.8 11/02/2023 0937   PROT 5.6 (L) 11/02/2023 0937   PROT 5.5 (L) 05/24/2023 0856   ALBUMIN 3.7 11/02/2023 0937   ALBUMIN 3.7 (L) 05/24/2023 0856   AST 18 11/02/2023 0937   ALT 39 (H) 11/02/2023 0937   ALKPHOS 93 11/02/2023 0937   BILITOT 0.6 11/02/2023 0937   BILITOT 0.4 05/24/2023 0856   GFRNONAA >60 10/08/2019 0701   GFRAA >60 10/08/2019 0701   Lab Results  Component Value Date   CHOL 127 11/02/2023   HDL 63.20 11/02/2023   LDLCALC 53 11/02/2023   TRIG 57.0 11/02/2023   CHOLHDL 2 11/02/2023   Lab Results  Component Value Date   HGBA1C 5.8 11/02/2023   Lab Results   Component Value Date   VITAMINB12 >1500 (H) 11/02/2023   Lab Results  Component Value Date   TSH 1.28 11/02/2023        No data to display              07/21/2023   11:17 AM  Montreal Cognitive Assessment   Visuospatial/ Executive (0/5) 4  Naming (0/3) 2  Attention: Read list of digits (0/2) 2  Attention: Read list of letters (0/1) 1  Attention: Serial 7 subtraction starting at 100 (0/3) 2  Language: Repeat phrase (0/2) 2  Language : Fluency (0/1) 0  Abstraction (0/2) 2  Delayed Recall (0/5) 0  Orientation (0/6) 5  Total 20     ASSESSMENT AND PLAN  63 y.o. year old female  has a past medical history of Anxiety, Arthritis, Back pain, Bilateral swelling of feet, Bulging lumbar disc, Diabetes mellitus without complication (HCC), Fibromyalgia, Hypertension, Joint pain, Lymphedema, Palpitations, Sleep apnea, Spinal stenosis, and Wears glasses. here with    Cognitive complaints  OSA (obstructive sleep apnea)  Ozzie Remmers is scheduled for a MRI brain 11/09/2023. Neurologic history and physical shows no obvious contraindications to procedure. Barring any unforseen circumstances,  she should do well with general anesthesia. Healthy lifestyle habits encouraged. She will follow up with PCP as directed. She will return to see Dr Buck pending MRI results. She verbalizes understanding and agreement with this plan.   No orders of the defined types were placed in this encounter.    No orders of the defined types were placed in this encounter.  I personally spent a total of 30 minutes in the care of the patient today including preparing to see the patient, getting/reviewing separately obtained history, performing a medically appropriate exam/evaluation, counseling and educating, placing orders, documenting clinical information in the EHR, independently interpreting results, communicating results, and coordinating care.   Greig Forbes, MSN, FNP-C 11/07/2023, 4:13  PM  [facility] [address], [address] [place], [place] [address] [phone]

## 2023-11-07 NOTE — Telephone Encounter (Signed)
 I called, 4 min wait and so pt has an appt today at 1530 for H and P to be completed for her upcoming MRI's under general anesthesia.  Angel Watkins was returning call from my phone call from her yesterday.  I do not need to speak to her unless she has other questions.

## 2023-11-07 NOTE — Telephone Encounter (Signed)
 Chyerl from [facility] Centralized Scheduling called  to request to speak to Particia Bill was returning Phone call   Callback number is  [phone]

## 2023-11-08 ENCOUNTER — Ambulatory Visit: Admitting: Occupational Therapy

## 2023-11-08 ENCOUNTER — Encounter (HOSPITAL_COMMUNITY): Payer: Self-pay | Admitting: *Deleted

## 2023-11-08 ENCOUNTER — Other Ambulatory Visit: Payer: Self-pay

## 2023-11-08 NOTE — Progress Notes (Signed)
 PCP - Lynwood Crandall, NP Cardiologist - none Pain Mgmt - Slater Earnie Lathe, PA  Chest x-ray - n/a EKG - DOS if 04/05/23 CE requested tracing not received from [facility] Stress Test - n/a ECHO - 07/28/22 Cardiac Cath - n/a  ICD Pacemaker/Loop - n/a  Sleep Study -  Yes, 07/2023 CPAP - mild - does not use CPAP  Diabetes Type 2, does not check blood sugar Last dose of Ozempic  was on 10/29/23     Aspirin & Blood Thinner Instructions:  n/a  NPO  Anesthesia review: Yes  STOP now taking any Aspirin (unless otherwise instructed by your surgeon), Aleve, Naproxen, Ibuprofen, Motrin, Advil, Voltaren , Goody's, BC's, all herbal medications, fish oil, and all vitamins.   Coronavirus Screening Do you have any of the following symptoms:  Cough yes/no: No Fever (>100.66F)  yes/no: No Runny nose yes/no: No Sore throat yes/no: No Difficulty breathing/shortness of breath  yes/no: No  Have you traveled in the last 14 days and where? yes/no: No  Patient verbalized understanding of instructions that were given via phone.

## 2023-11-08 NOTE — Anesthesia Preprocedure Evaluation (Addendum)
 Anesthesia Evaluation  Patient identified by MRN, date of birth, ID band Patient awake    Reviewed: Allergy & Precautions, NPO status , Patient's Chart, lab work & pertinent test results  History of Anesthesia Complications Negative for: history of anesthetic complications  Airway Mallampati: II  TM Distance: >3 FB Neck ROM: Limited    Dental  (+) Dental Advisory Given   Pulmonary sleep apnea (does not use CPAP) and Continuous Positive Airway Pressure Ventilation , neg COPD, neg recent URI Covid-19 Nucleic Acid Test Results Lab Results      Component                Value               Date                      SARSCOV2NAA              NEGATIVE            10/05/2019                SARSCOV2NAA              NOT DETECTED        11/29/2018              breath sounds clear to auscultation       Cardiovascular hypertension, Pt. on medications (-) angina (-) Past MI and (-) CHF  Rhythm:Regular Rate:Normal + Systolic murmurs TTE 07/28/2022: 1. Left ventricular ejection fraction, by estimation, is 60 to 65%. The  left ventricle has normal function. The left ventricle has no regional  wall motion abnormalities. Left ventricular diastolic parameters were  normal.   2. Right ventricular systolic function is normal. The right ventricular  size is normal. Tricuspid regurgitation signal is inadequate for assessing  PA pressure.   3. The mitral valve is normal in structure. No evidence of mitral valve  regurgitation.   4. The aortic valve is grossly normal. Aortic valve regurgitation is not  visualized.   5. The inferior vena cava is normal in size with greater than 50%  respiratory variability, suggesting right atrial pressure of 3 mmHg.    Neuro/Psych  PSYCHIATRIC DISORDERS Anxiety Depression     Neuromuscular disease    GI/Hepatic negative GI ROS, Neg liver ROS,GERD  Medicated,,'24 ECHO: EF 60-65%, normal LVF, normal RVF, no significant  valvular abnormalities   Endo/Other  diabetes, Type 2, Oral Hypoglycemic Agents  Class 3 obesitySemaglutide BMI 59  Renal/GU negative Renal ROSLab Results      Component                Value               Date                      CREATININE               0.92                10/08/2019           Lab Results      Component                Value               Date  K                        4.5                 10/08/2019                Musculoskeletal  (+) Arthritis ,  Fibromyalgia -  Abdominal  (+) + obese  Peds  Hematology Hb 13.2, plt 214k   Anesthesia Other Findings   Reproductive/Obstetrics                              Anesthesia Physical Anesthesia Plan  ASA: 3  Anesthesia Plan: General   Post-op Pain Management: Minimal or no pain anticipated   Induction: Intravenous  PONV Risk Score and Plan: 3 and Ondansetron , Dexamethasone  and Treatment may vary due to age or medical condition  Airway Management Planned: Oral ETT  Additional Equipment: None  Intra-op Plan:   Post-operative Plan: Extubation in OR  Informed Consent: I have reviewed the patients History and Physical, chart, labs and discussed the procedure including the risks, benefits and alternatives for the proposed anesthesia with the patient or authorized representative who has indicated his/her understanding and acceptance.     Dental advisory given  Plan Discussed with: CRNA  Anesthesia Plan Comments:          Anesthesia Quick Evaluation

## 2023-11-08 NOTE — Telephone Encounter (Signed)
 I faxed the paperwork to [facility] radiology.

## 2023-11-09 ENCOUNTER — Other Ambulatory Visit: Payer: Self-pay

## 2023-11-09 ENCOUNTER — Ambulatory Visit (HOSPITAL_BASED_OUTPATIENT_CLINIC_OR_DEPARTMENT_OTHER)

## 2023-11-09 ENCOUNTER — Ambulatory Visit (HOSPITAL_COMMUNITY)
Admission: RE | Admit: 2023-11-09 | Discharge: 2023-11-09 | Disposition: A | Discharge status: Home / Self Care | Attending: Neurosurgery | Admitting: Neurosurgery

## 2023-11-09 ENCOUNTER — Encounter (HOSPITAL_COMMUNITY): Payer: Self-pay | Admitting: Neurosurgery

## 2023-11-09 ENCOUNTER — Ambulatory Visit (HOSPITAL_COMMUNITY): Admitting: Certified Registered Nurse Anesthetist

## 2023-11-09 ENCOUNTER — Encounter (HOSPITAL_COMMUNITY): Admission: RE | Disposition: A | Payer: Self-pay | Source: Home / Self Care | Attending: Neurosurgery

## 2023-11-09 ENCOUNTER — Ambulatory Visit (HOSPITAL_BASED_OUTPATIENT_CLINIC_OR_DEPARTMENT_OTHER): Admitting: Certified Registered Nurse Anesthetist

## 2023-11-09 ENCOUNTER — Ambulatory Visit (HOSPITAL_COMMUNITY)

## 2023-11-09 ENCOUNTER — Ambulatory Visit (HOSPITAL_COMMUNITY)
Admission: RE | Admit: 2023-11-09 | Discharge: 2023-11-09 | Disposition: A | Discharge status: Home / Self Care | Source: Ambulatory Visit | Attending: Neurology | Admitting: Neurology

## 2023-11-09 DIAGNOSIS — M48061 Spinal stenosis, lumbar region without neurogenic claudication: Secondary | ICD-10-CM | POA: Insufficient documentation

## 2023-11-09 DIAGNOSIS — G4733 Obstructive sleep apnea (adult) (pediatric): Secondary | ICD-10-CM | POA: Insufficient documentation

## 2023-11-09 DIAGNOSIS — Z6841 Body Mass Index (BMI) 40.0 and over, adult: Secondary | ICD-10-CM | POA: Diagnosis not present

## 2023-11-09 DIAGNOSIS — Z791 Long term (current) use of non-steroidal anti-inflammatories (NSAID): Secondary | ICD-10-CM | POA: Insufficient documentation

## 2023-11-09 DIAGNOSIS — R251 Tremor, unspecified: Secondary | ICD-10-CM | POA: Diagnosis present

## 2023-11-09 DIAGNOSIS — Z833 Family history of diabetes mellitus: Secondary | ICD-10-CM | POA: Insufficient documentation

## 2023-11-09 DIAGNOSIS — K219 Gastro-esophageal reflux disease without esophagitis: Secondary | ICD-10-CM | POA: Insufficient documentation

## 2023-11-09 DIAGNOSIS — M50321 Other cervical disc degeneration at C4-C5 level: Secondary | ICD-10-CM

## 2023-11-09 DIAGNOSIS — Z820 Family history of epilepsy and other diseases of the nervous system: Secondary | ICD-10-CM | POA: Diagnosis present

## 2023-11-09 DIAGNOSIS — R419 Unspecified symptoms and signs involving cognitive functions and awareness: Secondary | ICD-10-CM | POA: Diagnosis present

## 2023-11-09 DIAGNOSIS — M1990 Unspecified osteoarthritis, unspecified site: Secondary | ICD-10-CM | POA: Insufficient documentation

## 2023-11-09 DIAGNOSIS — M797 Fibromyalgia: Secondary | ICD-10-CM | POA: Diagnosis not present

## 2023-11-09 DIAGNOSIS — I10 Essential (primary) hypertension: Secondary | ICD-10-CM | POA: Insufficient documentation

## 2023-11-09 DIAGNOSIS — E119 Type 2 diabetes mellitus without complications: Secondary | ICD-10-CM | POA: Diagnosis not present

## 2023-11-09 DIAGNOSIS — M4803 Spinal stenosis, cervicothoracic region: Secondary | ICD-10-CM | POA: Diagnosis not present

## 2023-11-09 DIAGNOSIS — M4804 Spinal stenosis, thoracic region: Secondary | ICD-10-CM | POA: Insufficient documentation

## 2023-11-09 DIAGNOSIS — M47816 Spondylosis without myelopathy or radiculopathy, lumbar region: Secondary | ICD-10-CM

## 2023-11-09 DIAGNOSIS — F419 Anxiety disorder, unspecified: Secondary | ICD-10-CM | POA: Insufficient documentation

## 2023-11-09 DIAGNOSIS — Z7985 Long-term (current) use of injectable non-insulin antidiabetic drugs: Secondary | ICD-10-CM | POA: Diagnosis not present

## 2023-11-09 DIAGNOSIS — F32A Depression, unspecified: Secondary | ICD-10-CM | POA: Diagnosis not present

## 2023-11-09 DIAGNOSIS — M47812 Spondylosis without myelopathy or radiculopathy, cervical region: Secondary | ICD-10-CM | POA: Insufficient documentation

## 2023-11-09 DIAGNOSIS — G959 Disease of spinal cord, unspecified: Secondary | ICD-10-CM

## 2023-11-09 DIAGNOSIS — Z79899 Other long term (current) drug therapy: Secondary | ICD-10-CM | POA: Diagnosis not present

## 2023-11-09 DIAGNOSIS — M5033 Other cervical disc degeneration, cervicothoracic region: Secondary | ICD-10-CM

## 2023-11-09 DIAGNOSIS — R413 Other amnesia: Secondary | ICD-10-CM | POA: Insufficient documentation

## 2023-11-09 DIAGNOSIS — Z8669 Personal history of other diseases of the nervous system and sense organs: Secondary | ICD-10-CM

## 2023-11-09 DIAGNOSIS — Z981 Arthrodesis status: Secondary | ICD-10-CM

## 2023-11-09 DIAGNOSIS — M4802 Spinal stenosis, cervical region: Secondary | ICD-10-CM | POA: Diagnosis not present

## 2023-11-09 DIAGNOSIS — Z7984 Long term (current) use of oral hypoglycemic drugs: Secondary | ICD-10-CM | POA: Insufficient documentation

## 2023-11-09 DIAGNOSIS — M50322 Other cervical disc degeneration at C5-C6 level: Secondary | ICD-10-CM

## 2023-11-09 DIAGNOSIS — I4510 Unspecified right bundle-branch block: Secondary | ICD-10-CM | POA: Diagnosis not present

## 2023-11-09 HISTORY — DX: Insomnia, unspecified: G47.00

## 2023-11-09 HISTORY — PX: RADIOLOGY WITH ANESTHESIA: SHX6223

## 2023-11-09 HISTORY — DX: Gastro-esophageal reflux disease without esophagitis: K21.9

## 2023-11-09 LAB — GLUCOSE, CAPILLARY: Glucose-Capillary: 97 mg/dL (ref 70–99)

## 2023-11-09 SURGERY — RADIOLOGY WITH ANESTHESIA
Anesthesia: General

## 2023-11-09 MED ORDER — PROPOFOL 10 MG/ML IV BOLUS
INTRAVENOUS | Status: DC | PRN
Start: 2023-11-09 — End: 2023-11-09
  Administered 2023-11-09: 150 mg via INTRAVENOUS

## 2023-11-09 MED ORDER — SUGAMMADEX SODIUM 200 MG/2ML IV SOLN
INTRAVENOUS | Status: DC | PRN
  Administered 2023-11-09: 400 mg via INTRAVENOUS

## 2023-11-09 MED ORDER — GADOBUTROL 1 MMOL/ML IV SOLN: 10.0000 mL | Freq: Once | INTRAVENOUS | Status: DC | PRN

## 2023-11-09 MED ORDER — MIDAZOLAM HCL 2 MG/2ML IJ SOLN
INTRAMUSCULAR | Status: DC | PRN
  Administered 2023-11-09: 2 mg via INTRAVENOUS

## 2023-11-09 MED ORDER — ROCURONIUM BROMIDE 10 MG/ML (PF) SYRINGE
PREFILLED_SYRINGE | INTRAVENOUS | Status: DC | PRN
  Administered 2023-11-09: 60 mg via INTRAVENOUS

## 2023-11-09 MED ORDER — INSULIN ASPART 100 UNIT/ML IJ SOLN: 0.0000 [IU] | INTRAMUSCULAR | Status: DC | PRN

## 2023-11-09 MED ORDER — GADOBUTROL 1 MMOL/ML IV SOLN
10.0000 mL | Freq: Once | INTRAVENOUS | Status: AC | PRN
  Administered 2023-11-09: 10 mL via INTRAVENOUS

## 2023-11-09 MED ORDER — FENTANYL CITRATE (PF) 100 MCG/2ML IJ SOLN
INTRAMUSCULAR | Status: AC
  Filled 2023-11-09: qty 2

## 2023-11-09 MED ORDER — ONDANSETRON HCL 4 MG/2ML IJ SOLN
INTRAMUSCULAR | Status: DC | PRN
  Administered 2023-11-09: 4 mg via INTRAVENOUS

## 2023-11-09 MED ORDER — DEXAMETHASONE SODIUM PHOSPHATE 10 MG/ML IJ SOLN
INTRAMUSCULAR | Status: DC | PRN
  Administered 2023-11-09: 8 mg via INTRAVENOUS

## 2023-11-09 MED ORDER — ORAL CARE MOUTH RINSE: 15.0000 mL | Freq: Once | OROMUCOSAL | Status: AC

## 2023-11-09 MED ORDER — LIDOCAINE 2% (20 MG/ML) 5 ML SYRINGE
INTRAMUSCULAR | Status: DC | PRN
  Administered 2023-11-09: 100 mg via INTRAVENOUS

## 2023-11-09 MED ORDER — LACTATED RINGERS IV SOLN: INTRAVENOUS | Status: DC

## 2023-11-09 MED ORDER — CHLORHEXIDINE GLUCONATE 0.12 % MT SOLN
15.0000 mL | Freq: Once | OROMUCOSAL | Status: AC
  Administered 2023-11-09: 15 mL via OROMUCOSAL

## 2023-11-09 MED ORDER — FENTANYL CITRATE (PF) 250 MCG/5ML IJ SOLN
INTRAMUSCULAR | Status: DC | PRN
  Administered 2023-11-09: 50 ug via INTRAVENOUS

## 2023-11-09 MED ORDER — MIDAZOLAM HCL 2 MG/2ML IJ SOLN
INTRAMUSCULAR | Status: AC
  Filled 2023-11-09: qty 2

## 2023-11-09 NOTE — Interval H&P Note (Signed)
 Anesthesia H&P Update: History and Physical Exam reviewed; patient is OK for planned anesthetic and procedure. ? ?

## 2023-11-09 NOTE — Anesthesia Postprocedure Evaluation (Signed)
 Anesthesia Post Note  Patient: Angel Watkins  Procedure(s) Performed: RADIOLOGY WITH ANESTHESIA     Patient location during evaluation: PACU Anesthesia Type: General Level of consciousness: sedated and patient cooperative Pain management: pain level controlled Vital Signs Assessment: post-procedure vital signs reviewed and stable Respiratory status: spontaneous breathing Cardiovascular status: stable Anesthetic complications: no   No notable events documented.  Last Vitals:  Vitals:   11/09/23 1000 11/09/23 1015  BP: 114/76 119/80  Pulse: 62   Resp: 12 13  Temp:  (!) 36.4 C  SpO2:  97%    Last Pain:  Vitals:   11/09/23 1015  TempSrc:   PainSc: 0-No pain                 Norleen Pope

## 2023-11-09 NOTE — Anesthesia Procedure Notes (Signed)
 Procedure Name: Intubation Date/Time: 11/09/2023 8:29 AM  Performed by: Worth Peppers, CRNAPre-anesthesia Checklist: Patient identified, Emergency Drugs available, Suction available and Patient being monitored Patient Re-evaluated:Patient Re-evaluated prior to induction Oxygen Delivery Method: Circle System Utilized Preoxygenation: Pre-oxygenation with 100% oxygen Induction Type: IV induction Ventilation: Mask ventilation without difficulty Laryngoscope Size: Mac and 4 Grade View: Grade II Tube type: Oral Tube size: 7.5 mm Number of attempts: 1 Airway Equipment and Method: Stylet and Oral airway Placement Confirmation: ETT inserted through vocal cords under direct vision, positive ETCO2 and breath sounds checked- equal and bilateral Secured at: 22 cm Tube secured with: Tape Dental Injury: Teeth and Oropharynx as per pre-operative assessment

## 2023-11-09 NOTE — Progress Notes (Signed)
 Today's EKG reviewed by Dr. KYM Mace.

## 2023-11-09 NOTE — Transfer of Care (Signed)
 Immediate Anesthesia Transfer of Care Note  Patient: Angel Watkins  Procedure(s) Performed: RADIOLOGY WITH ANESTHESIA  Patient Location: PACU  Anesthesia Type:General  Level of Consciousness: awake, alert , and oriented  Airway & Oxygen Therapy: Patient Spontanous Breathing  Post-op Assessment: Report given to RN and Post -op Vital signs reviewed and stable  Post vital signs: Reviewed and stable  Last Vitals:  Vitals Value Taken Time  BP    Temp    Pulse 72 11/09/23 09:49  Resp 11 11/09/23 09:49  SpO2 95 % 11/09/23 09:49  Vitals shown include unfiled device data.  Last Pain:  Vitals:   11/09/23 0711  TempSrc:   PainSc: 0-No pain      Patients Stated Pain Goal: 0 (11/09/23 0711)  Complications: No notable events documented.

## 2023-11-10 ENCOUNTER — Encounter (HOSPITAL_COMMUNITY): Payer: Self-pay | Admitting: Radiology

## 2023-11-10 ENCOUNTER — Encounter: Admitting: Psychology

## 2023-11-10 LAB — GLUCOSE, CAPILLARY: Glucose-Capillary: 90 mg/dL (ref 70–99)

## 2023-11-14 ENCOUNTER — Encounter: Payer: Self-pay | Admitting: Nurse Practitioner

## 2023-11-15 ENCOUNTER — Ambulatory Visit: Admitting: Occupational Therapy

## 2023-11-22 ENCOUNTER — Ambulatory Visit: Admitting: Occupational Therapy

## 2023-11-22 ENCOUNTER — Encounter: Payer: Self-pay | Admitting: Nurse Practitioner

## 2023-11-28 ENCOUNTER — Encounter: Attending: Psychology

## 2023-11-28 DIAGNOSIS — R413 Other amnesia: Secondary | ICD-10-CM | POA: Diagnosis not present

## 2023-11-28 NOTE — Progress Notes (Signed)
 Mental Status/Behavioral Observations (11/28/2023):  Orientation: The patient was oriented to self, place, and time. Sensory/Arousal: Hearing and vision were adequate for testing. The patient was alert. Appearance: Dress and hygiene were appropriate for the setting.  Speech/Language: In conversation, the patient's speech was prosodic, fluent, and well-articulated. The patient displayed no indications of word finding difficulties and no word substitution errors were observed.  Motor: The patient ambulated with the assistance of a cane. No tremors were observed.  Social Comportment: Social behavior was appropriate to the setting. Mood/Affect: Mood was largely neutral to positive. Affect was consistent with mood.  Attention/Concentration:  The patient appeared to maintain consistent engagement throughout testing. No frank attentional lapses were observed.  Thought Process/Content: The patient's thought process was coherent, linear, goal directed. There were no indications of psychosis.  Additional Observations: The patient showed no difficulties with understanding task instructions. No difficulties with frustration tolerance were noted.  Neuropsychology Note Angel Watkins completed 120 minutes of neuropsychological testing with technician, Josue Ned, BA, under the supervision of Evalene Riff, PsyD., Clinical Neuropsychologist. The patient did not appear overtly distressed by the testing session, per behavioral observation or via self-report to the technician. Rest breaks were offered.   Clinical Decision Making: In considering the patient's current level of functioning, level of presumed impairment, nature of symptoms, emotional and behavioral responses during clinical interview, level of literacy, and observed level of motivation/effort, a battery of tests was selected by Dr. Riff during initial consultation on 10/18/2023. This was communicated to the technician. Communication between  the neuropsychologist and technician was ongoing throughout the testing session and changes were made as deemed necessary based on patient performance on testing, technician observations and additional pertinent factors such as those listed above.  Tests Administered: Automatic Data Edition (BNT-2) Brief Visuospatial Memory Test-Revised (BVMT-R) Benton Judgment of Line Orientation (JOLO) [place]  Verbal Learning Test-Third Edition (CVLT-3) Clock Drawing Test Controlled Oral Word Association Test (FAS & Animals) Delis-Kaplan Executive Function System (D-KEFS), select subtests Rey Complex Figure Test (RCFT), select subtests Trail Making Test (TMT; Part A & B) Wechsler Adult Intelligence Scale-Fourth Edition (WAIS-IV), select subtests Wechsler Memory Scale-Fourth Edition (WMS-IV) , select subtests Wechsler Memory Scale-Third Edition (WMS-III), select subtests  Wechsler Test of Adult Reading (WTAR) The Beck Depression Inventory-II (BDI-II) Beck Anxiety Inventory (BAI)  Results: Note: This summary of test scores accompanies the interpretive report and should not be interpreted by unqualified individuals or in isolation without reference to the report. Test scores are relative to age, gender, and educational history as available and appropriate. Measurement properties of test scores: IQ, Index, and Standard Scores (SS): Mean = 100; Standard Deviation = 15; Scaled Scores (ss): Mean = 10; Standard Deviation = 3; Z scores (Z): Mean = 0; Standard Deviation = 1; T scores (T); Mean = 50; Standard Deviation = 10  Intellectual/Premorbid Functioning Estimate   Norm Score Percentile  Range  Wechsler Test of Adult Reading  SS = 111 77 %ile High Average   ATTENTION AND WORKING MEMORY    Norm Score Percentile  Range  WAIS-IV          Digit Span  ss = 12 75 %ile High Average   DSF  ss = 11 63 %ile Average   Span:    7      DSB  ss = 12 75 %ile High Average   Span:    6      DSS  ss = 11 63  %ile Average   Span:  6     WMS-III          Spatial Span  ss = 8 25 %ile Average   SSF  ss = 9 37 %ile Average   Span:    5      SSB  ss = 9 37 %ile Average   Span:    4      PROCESSING SPEED    Norm Score Percentile  Range  WAIS-IV          Coding  ss = 11 63 %ile Average   Symbol Search  ss = 13 84 %ile High Average   LANGUAGE    Norm Score Percentile  Range  Boston Naming Test (BNT-2)  t = 42 21 %ile Low Average  COWAT          FAS  t = 39 13 %ile Low Average   Animals  t = 58 79 %ile High Average   EXECUTIVE FUNCTIONING    Norm Score Percentile  Range  DKEFS - Color-Word Interference          Color Naming  ss = 13 84 %ile High Average   Word Reading  ss = 11 63 %ile Average   Inhibition  ss = 12 75 %ile High Average   Errors  ss = 11 63 %ile Average   Inhibition Switching  ss = 12 75 %ile High Average   Errors  ss = 13 84 %ile High Average  Trails A  t = 52 58 %ile Average  Trails B  t = 61 86 %ile High Average   MEMORY    Norm Score Percentile  Range  BVMT-R          Trial 1  t = 53.0 61 %ile Average   Trial 2  t = 65 94 %ile Above Average   Trial 3  t = 65.0 94 %ile Above Average   Total Recall  t = 62.0 88 %ile High Average   Learning  t = 63.0 91 %ile Above Average   Delayed Recall  t = 66.0 95 %ile Above Average   % Retained    100 >16 %ile WNL   Hits     >16 %ile WNL   False Alarms     >16 %ile WNL   Recognition Discriminability     >16 %ile WNL  CVLT-III          Trial 1  ss = 10.0 50 %ile Average   Trial 2  ss = 9.0 37 %ile Average   Trial 3  ss = 10.0 50 %ile Average   Trial 4  ss = 10.0 50 %ile Average   Trial 5  ss = 6.0 9 %ile Low Average   Trial B  ss = 11.0 63 %ile Average   Short Delay Free Recall  ss = 9.0 37 %ile Average   Short Delay Cued Recall  ss = 9.0 37 %ile Average   Long Delay Free Recall  ss = 9.0 37 %ile Average   Long Delay Cued Recall  ss = 10.0 50 %ile Average   Total Hits  ss = 10.0 50 %ile Average   Total False Positives   ss = 11.0 63 %ile Average   Recognition Discriminability  ss = 12.0 75 %ile High Average   Total Intrusions  ss = 14.0 91 %ile Above Average   Trials 1-5 Total Correct  SS = 94 34 %ile Average   Total Repetitions  ss = 11.0 63 %ile Average   List B vs. Trial 1  ss = 11.0 63 %ile Average   SD (FR) vs. Trial 5 Correct  ss = 13.0 84 %ile High Average   LD (FR)vs. SD (FR)  ss = 11.0 63 %ile Average  Wechsler Memory Scale, 4th Edition (WMS-4)         Log. Mem. Immediate Recall  ss = 13 84 %ile High Average   Logical Memory Delayed Recall  ss = 14 91 %ile Above Average   Logical Recognition    >75th   %ile High Average   VISUAL-SPATIAL    Norm Score Percentile  Range  Benton JOLO  ss = 9 37 %ile Average  Rey Complex Figure Copy       >16 %ile WNL  Clock          PERSONALITY AND BEHAVIORAL FUNCTIONING      Score/Interpretation  BDI Raw       13  BDI Severity       Minimal.  BAI Raw       17  BAI Severity       Moderate.    Feedback to Patient: Angel Watkins will return on 12/12/2023 for an interactive feedback session with Dr. Hayden at which time her test performances, clinical impressions and treatment recommendations will be reviewed in detail. The patient understands she can contact our office should she require our assistance before this time.  120 minutes spent face-to-face with patient administering standardized tests, 120 minutes spent scoring radiographer, therapeutic). [CPT [id number], [id number]]  Full report to follow.

## 2023-11-29 ENCOUNTER — Ambulatory Visit: Admitting: Occupational Therapy

## 2023-11-30 ENCOUNTER — Encounter (INDEPENDENT_AMBULATORY_CARE_PROVIDER_SITE_OTHER): Payer: Self-pay | Admitting: Family Medicine

## 2023-11-30 ENCOUNTER — Ambulatory Visit (INDEPENDENT_AMBULATORY_CARE_PROVIDER_SITE_OTHER): Admitting: Family Medicine

## 2023-11-30 VITALS — BP 124/80 | HR 67 | Temp 97.9°F | Ht 64.0 in | Wt 352.0 lb

## 2023-11-30 DIAGNOSIS — E1165 Type 2 diabetes mellitus with hyperglycemia: Secondary | ICD-10-CM | POA: Diagnosis not present

## 2023-11-30 DIAGNOSIS — Z6841 Body Mass Index (BMI) 40.0 and over, adult: Secondary | ICD-10-CM

## 2023-11-30 DIAGNOSIS — I890 Lymphedema, not elsewhere classified: Secondary | ICD-10-CM

## 2023-11-30 DIAGNOSIS — Z7985 Long-term (current) use of injectable non-insulin antidiabetic drugs: Secondary | ICD-10-CM | POA: Diagnosis not present

## 2023-11-30 NOTE — Progress Notes (Signed)
   SUBJECTIVE:  Chief Complaint: Obesity  Interim History: Patient is current holding a significant amount of fluid.  She did order meal kits from [facility] and felt this was making steps to eat clean.  She is feeling frustrated because she knows she needs to get her knees replaced and voices that she needs a 40 BMI.  [place] many kids she is no anticipating having any kids trick or treating.  She is awaiting evaluation of her elbow by [facility] as there is a possible issue with ulnar nerve.  Angel Watkins is here to discuss her progress with her obesity treatment plan. She is on the Category 2 Plan and states she is following her eating plan approximately 70 % of the time. She states she is exercising 15 minutes 5 times per week.   OBJECTIVE: Visit Diagnoses: Problem List Items Addressed This Visit   None   No data recorded Anthropometric Measurements Height: 5' 4 (1.626 m) Weight at Last Visit: 335 lb Starting Weight: 333 lb   No data recorded Other Clinical Data Today's Visit #: 79 Starting Date: 04/17/20 Comments: Cat 2     ASSESSMENT AND PLAN: Assessment & Plan Type 2 diabetes mellitus with hyperglycemia, without long-term current use of insulin  (HCC) Patient is tolerating 1 mg dose of Ozempic  weekly.  She is still not getting adequate intake of nutrition due to meal skipping.  Occasionally she does intelligently eat.  No refill needed at this time.  May need to discuss transitioning over to Mounjaro  if Ozempic  does not elicit further weight loss. Lymphedema Patient to reach out to medical supply company for further guidance on lymphedema treatment in terms of her compression shorts.  At this time we will need to follow-up at next appointment to ensure patient is able to comfortably get in compression shorts for effective treatment. Morbid obesity (HCC)  Body mass index (BMI) of 60.0 to 69.9 in adult [facility])    Diet: Angel Watkins is currently in the action  stage of change. As such, her goal is to continue with weight loss efforts and has agreed to the Category 2 Plan.   Exercise:  All adults should avoid inactivity. Some activity is better than none, and adults who participate in any amount of physical activity, gain some health benefits.  Behavior Modification:  We discussed the following Behavioral Modification Strategies today: increasing lean protein intake, decreasing simple carbohydrates, increasing vegetables, meal planning and cooking strategies, keeping healthy foods in the home, and holiday eating strategies.   Follow-up in 4 to 6 weeks  She was informed of the importance of frequent follow up visits to maximize her success with intensive lifestyle modifications for her multiple health conditions.  Attestation Statements:   Reviewed by clinician on day of visit: allergies, medications, problem list, medical history, surgical history, family history, social history, and previous encounter notes.     Angel Cho, MD

## 2023-12-05 ENCOUNTER — Encounter: Attending: Psychology | Admitting: Psychology

## 2023-12-05 DIAGNOSIS — F419 Anxiety disorder, unspecified: Secondary | ICD-10-CM | POA: Diagnosis not present

## 2023-12-05 DIAGNOSIS — R4189 Other symptoms and signs involving cognitive functions and awareness: Secondary | ICD-10-CM

## 2023-12-05 DIAGNOSIS — F32A Depression, unspecified: Secondary | ICD-10-CM | POA: Diagnosis not present

## 2023-12-05 DIAGNOSIS — R413 Other amnesia: Secondary | ICD-10-CM | POA: Insufficient documentation

## 2023-12-06 ENCOUNTER — Ambulatory Visit: Admitting: Occupational Therapy

## 2023-12-07 NOTE — Assessment & Plan Note (Signed)
 Patient to reach out to medical supply company for further guidance on lymphedema treatment in terms of her compression shorts.  At this time we will need to follow-up at next appointment to ensure patient is able to comfortably get in compression shorts for effective treatment.

## 2023-12-07 NOTE — Assessment & Plan Note (Signed)
 Patient is tolerating 1 mg dose of Ozempic  weekly.  She is still not getting adequate intake of nutrition due to meal skipping.  Occasionally she does intelligently eat.  No refill needed at this time.  May need to discuss transitioning over to Mounjaro  if Ozempic  does not elicit further weight loss.

## 2023-12-12 ENCOUNTER — Encounter: Admitting: Psychology

## 2023-12-12 DIAGNOSIS — F419 Anxiety disorder, unspecified: Secondary | ICD-10-CM

## 2023-12-12 DIAGNOSIS — R4189 Other symptoms and signs involving cognitive functions and awareness: Secondary | ICD-10-CM | POA: Diagnosis not present

## 2023-12-12 DIAGNOSIS — F32A Depression, unspecified: Secondary | ICD-10-CM | POA: Diagnosis not present

## 2023-12-13 ENCOUNTER — Ambulatory Visit: Admitting: Occupational Therapy

## 2023-12-20 ENCOUNTER — Ambulatory Visit: Admitting: Occupational Therapy

## 2023-12-25 ENCOUNTER — Encounter (INDEPENDENT_AMBULATORY_CARE_PROVIDER_SITE_OTHER): Payer: Self-pay | Admitting: Family Medicine

## 2023-12-25 ENCOUNTER — Ambulatory Visit (INDEPENDENT_AMBULATORY_CARE_PROVIDER_SITE_OTHER): Payer: Self-pay | Admitting: Family Medicine

## 2023-12-25 ENCOUNTER — Ambulatory Visit: Admitting: Occupational Therapy

## 2023-12-25 VITALS — BP 115/74 | HR 78 | Temp 98.1°F | Ht 64.0 in | Wt 347.0 lb

## 2023-12-25 DIAGNOSIS — Z6841 Body Mass Index (BMI) 40.0 and over, adult: Secondary | ICD-10-CM | POA: Diagnosis not present

## 2023-12-25 DIAGNOSIS — F321 Major depressive disorder, single episode, moderate: Secondary | ICD-10-CM | POA: Diagnosis not present

## 2023-12-25 NOTE — Progress Notes (Unsigned)
   SUBJECTIVE:  Chief Complaint: Obesity  Interim History: Patient mentions her sleep patterns are very different with the time change.  She has been taking medications to help but is still waking up frequently. Foodwise she has been eating a bit more.  She and her husband have tried packaged meals that you prepare but felt it wasn't quite enough in terms of quantity.  Overall is still craving sugar but not so much hunger.  For Thanksgiving she is hosting 23 people at least. Husband is cooking more and is making more nutritious choices for food prep. She is taking baby steps in terms of incorporating consistent activity.   Angel Watkins is here to discuss her progress with her obesity treatment plan. She is on the Category 2 Plan and states she is following her eating plan approximately 50 % of the time. She states she is exercising 30 minutes 7 times per week.   OBJECTIVE: Visit Diagnoses: Problem List Items Addressed This Visit   None   Vitals Temp: 98.1 F (36.7 C) BP: 115/74 Pulse Rate: 78 SpO2: 96 %   Anthropometric Measurements Height: 5' 4 (1.626 m) Weight: (!) 347 lb (157.4 kg) BMI (Calculated): 59.53 Weight at Last Visit: 352 lb Weight Lost Since Last Visit: 5 Weight Gained Since Last Visit: 0 Starting Weight: 333 lb Total Weight Loss (lbs): 0 lb (0 kg)   Body Composition  Body Fat %: 70.3 % Fat Mass (lbs): 244.2 lbs Muscle Mass (lbs): 98 lbs Visceral Fat Rating : 32   Other Clinical Data Today's Visit #: 48 Starting Date: 04/17/20 Comments: Cat 2     ASSESSMENT AND PLAN:  Diet: Angel Watkins is currently in the action stage of change. As such, her goal is to continue with weight loss efforts and has agreed to the Category 2 Plan.   Exercise:  For substantial health benefits, adults should do at least 150 minutes (2 hours and 30 minutes) a week of moderate-intensity, or 75 minutes (1 hour and 15 minutes) a week of vigorous-intensity aerobic physical activity, or  an equivalent combination of moderate- and vigorous-intensity aerobic activity. Aerobic activity should be performed in episodes of at least 10 minutes, and preferably, it should be spread throughout the week.  Behavior Modification:  We discussed the following Behavioral Modification Strategies today: increasing lean protein intake, decreasing simple carbohydrates, increasing vegetables, meal planning and cooking strategies, holiday eating strategies, and planning for success. We discussed various medication options to help Angel Watkins with her weight loss efforts and we both agreed to ***.  No follow-ups on file.   She was informed of the importance of frequent follow up visits to maximize her success with intensive lifestyle modifications for her multiple health conditions.  Attestation Statements:   Reviewed by clinician on day of visit: allergies, medications, problem list, medical history, surgical history, family history, social history, and previous encounter notes.     Angel Cho, MD

## 2023-12-27 ENCOUNTER — Ambulatory Visit: Admitting: Occupational Therapy

## 2024-01-03 ENCOUNTER — Ambulatory Visit: Admitting: Occupational Therapy

## 2024-01-03 ENCOUNTER — Ambulatory Visit: Attending: Physician Assistant | Admitting: Occupational Therapy

## 2024-01-03 DIAGNOSIS — I890 Lymphedema, not elsewhere classified: Secondary | ICD-10-CM | POA: Diagnosis present

## 2024-01-03 NOTE — Therapy (Signed)
 OUTPATIENT OCCUPATIONAL THERAPY TREATMENT NOTE AND DISCHARGE SUMMARY  BILATERAL LOWER EXTREMITY/ BILATERAL LOWER QUADRANT LIPO-LYMPHEDEMA  Patient Name: Angel Watkins MRN: [id number] DOB:September 21, 1960, 63 y.o., female Today's Date: 01/03/2024  END OF SESSION:   OT End of Session - 01/03/24 0804     Visit Number 12    Number of Visits 36    Date for Recertification  01/30/24    OT Start Time 0800    OT Stop Time 0840    OT Time Calculation (min) 40 min    Activity Tolerance Patient tolerated treatment well;No increased pain    Behavior During Therapy WFL for tasks assessed/performed            Past Medical History:  Diagnosis Date   Anxiety    Arthritis    Back pain    Bilateral swelling of feet    Bulging lumbar disc    and cervical   Diabetes mellitus without complication (HCC)    type 2   Fibromyalgia    GERD (gastroesophageal reflux disease)    Hypertension    Insomnia    trouble staying asleep not falling asleep   Joint pain    Lymphedema    Memory loss 03/16/2023   mild per pt   Palpitations    Sleep apnea    mild - does not use CPAP   Spinal stenosis    Tremor 03/16/2023   Wears glasses    Past Surgical History:  Procedure Laterality Date   ABDOMINAL HYSTERECTOMY     ANAL FISSURE REPAIR     ANTERIOR CERVICAL DECOMP/DISCECTOMY FUSION N/A 12/03/2018   Procedure: Cervical five-six  Anterior cervical decompression/discectomy/fusion;  Surgeon: Cheryle Debby LABOR, MD;  Location: [facility] OR;  Service: Neurosurgery;  Laterality: N/A;   BUNIONECTOMY Left 2009   COLONOSCOPY  09/21/2018   x 2:  09/21/18 & 03/12/15   GASTRIC BYPASS     LUMBAR LAMINECTOMY  09/17/2021   Left L3-S1 and on 10/06/21   MRI     x 2 - 12/14/17 & 10/08/19   RADIOLOGY WITH ANESTHESIA N/A 12/14/2017   Procedure: MRI WITH ANESTHESIA, LUMBAR SPINE WITHOUT CONTRAST, CERVICAL SPINE WITHOUT CONTRAST;  Surgeon: Radiologist, Medication, MD;  Location: [facility] OR;  Service: Radiology;  Laterality: N/A;    RADIOLOGY WITH ANESTHESIA N/A 10/08/2019   Procedure: MRI WITH ANESTHESIA    LUMBAR WITHOUT;  Surgeon: Radiologist, Medication, MD;  Location: [facility] OR;  Service: Radiology;  Laterality: N/A;   RADIOLOGY WITH ANESTHESIA N/A 11/09/2023   Procedure: RADIOLOGY WITH ANESTHESIA;  Surgeon: Radiologist, Medication, MD;  Location: [facility] OR;  Service: Radiology;  Laterality: N/A;  MRI OF BRAIN WITH AND WITHOUT CONTRAST   right shoulder total replacement  04/2023   [facility]   UPPER GI ENDOSCOPY  09/13/2022   Patient Active Problem List   Diagnosis Date Noted   History of Roux-en-Y gastric bypass 11/02/2023   Chronic pain syndrome 11/02/2023   Morbid obesity (HCC) 07/14/2023   Hyperlipidemia associated with type 2 diabetes mellitus (HCC) 05/29/2023   SOBOE (shortness of breath on exertion) 05/24/2023   Lymphedema 03/08/2023   Anxiety and depression 12/06/2022   Eating disorder 12/02/2020   Vitamin D  deficiency 12/02/2020   Type 2 diabetes mellitus with hyperglycemia, without long-term current use of insulin  (HCC) 07/20/2020   Hypertension associated with diabetes (HCC) 07/20/2020   Class 3 severe obesity with serious comorbidity and body mass index (BMI) of 50.0 to 59.9 in adult [facility]) 07/20/2020   Cervical radiculopathy 12/03/2018  PCP: Brian missy Roys, PA ([facility], [place], [place])  REFERRING PROVIDER: Toribio Kerry, MD  REFERRING DIAG: I89.0  THERAPY DIAG:  Lymphedema, not elsewhere classified  Rationale for Evaluation and Treatment: Rehabilitation  ONSET DATE: 2002 s/p gastric bypass.  SUBJECTIVE:                                                                                                                                                                                           SUBJECTIVE STATEMENT:  Pt presents to OT for lymphedema care. Pt was last seen on 11/01/23. She is unaccompanied this morning. Pt reports ongoing knee pain and lymphedema related leg  discomfort. She does not rate pain numerically. Pt brings adjustable compression leggings (CircAid) to clinic for training on calibration. Pt made a video during session as training aid for her husband, who assists her.   Pt has a few new complaints today. First, she reports she has difficulty donning the Flexitouch garments independently and she would like to be able to treat both lower extremities and lower quadrants simultaneously. Specifically she has difficulty pulling zipper on shorts. Secondly she reports that she had to remove the CircAid we fitted last time by 3 PM because she felt like , something wasn't right in my chest.   (09/13/23 [facility] TRIAL : Lalani returns to OT to continue Lymphedema treatment to BLE. Pt is accompanied by her husband, [name], today. [name] has been wrapping her and it's helping.  Pt reports 3/10 pins and needles in bilateral thighs. Manufacturer's rep from [employer] due here this morning to assist with a trial of the advanced Flexitouch sequential pneumatic compression device. Mrs Witthuhn commenced OT for Complete Decongestive Therapy (CDT)  on 07/12/23. Pt has been using the [employer] basic NIMBL sequential pneumatic compression device, or pump, daily for over a month without significant improvement in her condition. The advanced device is medically necessary to treat swelling that extends proximally above the thighs into the hips, buttocks and abdomen that the basic device does not cover.)  (07/12/23 OT Initial Eval: Angel Watkins is referred to Occupational Therapy by Toribio Kerry, MD at [facility] for evaluation and treatment of BLE/BLQ Lipo-lymphedema.Pt reports she first noticed leg swelling after gastric bypass surgery. She states, I lost 100 pounds, but my legs just got bigger instead of going down. Pt reports lower extremity and lower quadrant ( buttocks, abdomen, hips) worsened continues to worsen over time. She is unable to fit off  the shelf compression stockings. Diuretics, prescribed in the past, do not reduce swelling. Pt describes lower extremity/ lower quadrant pain and discomfort as heaviness. She rates  this painful sensation as 6-7/10. Pt reports her mother had some symptoms of lipedema, but she is unaware of other relatives that may have had lymphedema or lipedema. Pt denies hx of cellulitis and blood clots. Pt reports her mother had the same body type with a small waist and large, fatty hips and legs.  )  PERTINENT HISTORY:   Anxiety    Arthritis    Back pain    Bilateral swelling of feet    Bulging lumbar disc    and cervical   Diabetes mellitus without complication (HCC)    Fibromyalgia    Hypertension    Joint pain    Palpitations    Sleep apnea    Spinal stenosis   Gastric bypass 2002   Class 3 severe obesity with serious comorbidity and body mass index (BMI) of 50.0 to 59.9 in adult (BMI measured this date 55.6)   Cervical radiculopathy    PAIN:  Are you having pain?  Yes, 5/10 pins and needs, R>L knee  PRECAUTIONS: Other: LYMPHEDEMA PRECAUTIONS  WEIGHT BEARING RESTRICTIONS: No  FALLS:  Has patient fallen in last 6 months? Yes. Number of falls 1; Just got a new scooter. It caught my shirt and pulled me down. But I wasn't hurt.  LIVING ENVIRONMENT: Lives with: lives with their spouse Lives in: House/apartment Stairs: Yes; External: 3 steps; can reach both Has following equipment at home: Single point cane, shower chair, and hand held shower  OCCUPATION: advertising account executive full time  LEISURE: enjoys visiting vineyards  HAND DOMINANCE: right   PRIOR LEVEL OF FUNCTION: Independent  PATIENT GOALS: 1. Get swelling under control     2. Limit progression  OBJECTIVE: Note: Objective measures were completed at Evaluation unless otherwise noted.  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 Non cancer related  INFECTIONS: Denies hx of cellulitis  WOUNDS: Denies hx of leg wounds   BLE COMPARATIVE LIMB VOLUMETRICS : Initial 07/26/23  LANDMARK RIGHT  (dominant)  R LEG (A-D) 7303.9 ml  R THIGH (E-G) ml  R FULL LIMB (A-G) ml  Limb Volume differential (LVD)  LVD measures 1.51 % , L>R  Volume change since last measured %  Volume change since initial V  (Blank rows = not tested)  LANDMARK LEFT   L LEG (A-D) 7193.7 ml  L THIGH (E-G) ml  L FULL LIMB (A-G) ml  Limb Volume differential (LVD)  %  Volume change since initial %  Volume change overall %  (Blank rows = not tested)   RLE COMPARATIVE LIMB VOLUMETRICS :  10/05/23: 10th visit Progress Report  LANDMARK RIGHT  (dominant)  R LEG (A-D) 9040.2 ml  R THIGH (E-G) ml  R FULL LIMB (A-G) ml  Limb Volume differential (LVD)    Volume change since last measured on 07/26/23  R LEG VOLUME INCREASED by 23.8%  Volume change since initial V  (Blank rows = not tested)  Moderate-Severe, Stage  II, Bilateral Lower Extremity Lipo- Lymphedema 2/2  Severe, Type III Lipedema involving buttocks, abdomen and ankles  Skin  Description Hyper-Keratosis Peau d' Orange Hyperplasia Tight Fibrotic/ Indurated Fatty Doughy Spongy/ boggy    x x  x x x x   Skin dry Flaky WNL Macerated   mild      Color Redness Varicosities Blanching Hemosiderin Stain Mottled    x    x   Odor Malodorous Yeast Fungal infection  WNL      x   Temperature Warm Cool wnl    x     Pitting Edema   1+ 2+ 3+ 4+ Non-pitting         x   Girth Symmetrical Asymmetrical                   Distribution   x  Toes to groin, abdomen, buttocks    Stemmer Sign Positive Negative   +     Lymphorrhea History Of:  Present Absent     x    Wounds History Of Present Absent Venous Arterial Pressure Sheer     x        Signs of Infection Redness Warmth Erythema Acute Swelling Drainage Borders                    Sensation Light Touch Deep pressure Hypersensitivity   Impaired In tact Impaired In tact Absent Impaired    x  x  x    Nails WNL   Fungus nail dystrophy   x     Hair Growth Symmetrical Asymmetrical   x    Skin Creases Base of toes  Ankles   Base of Fingers knees       Abdominal pannus Thigh Lobules  Face/neck    x  x x x     GAIT: Distance walked: >500' Assistive device utilized: Single point cane Level of assistance: Modified independence Comments: extra time needed  LYMPHEDEMA LIFE IMPACT SCALE (LLIS): INITIAL 57.35%; FINAL: 11/01/23.53% . Pt achieved a 31.72% reduction. GOAL MET (The extent to which lymphedema related problems affected your life during the past week.)                                                                                                                          TREATMENT THIS DATE:  Pt edu for LE self-care, including wear and care, donning and doffing and gauging compression on New CircAid garment alternatives, progress towards goals to date, POC going forward, LE precautions; Signs/ symptoms of cellulitis and DVT  PATIENT EDUCATION:  Continued Pt/ CG edu for lymphedema self care home program throughout session. Topics include outcome of comparative limb volumetrics- starting limb volume differentials (LVDs), technology and gradient techniques used for short stretch, multilayer compression wrapping, simple self-MLD, therapeutic lymphatic pumping exercises, skin/nail care, LE precautions, compression garment recommendations and specifications, wear and care schedule and compression garment donning / doffing w assistive devices. Discussed progress towards all OT goals since commencing CDT. Discussed detrimental impact of  obesity on lower and upper extremity lymphedema over time. Reviewed OT goals for lymphedema care with Pt and discussed progress to date.   Provided Pt education  re precautions related to use of advanced sequential pneumatic compression device. Pt verbalized understanding that she should never use device on 2 arms/legs simultaneously and should not use device 2 x on same day to avoid overloading her heart with fluid volume return both at present, and in years to come should her medical condition change. Pt instructed to remove device immediately should she experience atypical SOP, light headedness, or acute pain. Pt instructed to discontinue pump if she suspects, or has any infection, blood clot, cellulitis, the flu, corona virus, etc.  All questions answered to the Pt's satisfaction. Good return. Person educated: Patient Education method: Explanation, Demonstration, and Handouts Education comprehension: verbalized understanding, returned demonstration, verbal cues required, and needs further education   LYMPHEDEMA SELF-CARE HOME PROGRAM:  BLE lymphatic pumping there ex using- 1 sets of 10 reps, each exercise in order-  1-2 x daily, bilaterally Simple self MLD 1 x daily , or Flexitouch advanced sequential compression on single lower extremity/lower quadrant 1 x daily for one hour. Do not use pump device more than once daily on more than one limb to limit risk of cardiac overload. Daily skin care to increase hydration, skin mobility and decrease infection risk- can be done during MLD Intensive Phase CDT: Compression wraps 23/7 until garment fitting complete Self-management Phase CDT:  [employer]iven CircAid Juxtafit Essentials, knee length bilaterally, size XL-WIDE CALF-LONG are medically necessary to provide accessible compression to control lipo-lymphedema of the legs over time,  that are easy to doff and don, and provide a built in gauging system allowing for graduated , adjustable compression.   Custom-made gradient compression garments and HOS devices are medically necessary because they are uniquely sized and shaped to fit the exact dimensions of the affected extremities, and to provide appropriate medical grade, graduated compression essential for optimally managing chronic, progressive lymphedema. Multiple custom compression garments are needed to ensure proper hygiene to limit infection risk. Custom compression garments should be replaced q 3-6 months When worn consistently for optimal lipo-lymphedema self-management over time. HOS devices, medically necessary to limit fibrosis buildup in tissue, should be replaced q 2 years and PRN when worn out.      ASSESSMENT:  CLINICAL IMPRESSION:   In keeping with lymphedema precautions, OT denied Pt request to add adapter to Flexitouch device that enables use sequential pump bilaterally and on trunk simultaneously. Because Pt reports discomfort in her chest when wearing an adjustable compression legging on one leg I am concerned the Flexi, which when on a single lower extremity and quadrant already, may result in fluid overload to the heart. Using the device on both lower extremities in this case is ruled out. OT will , however, speak with the manufacturer's rep and ask for another training session at Pt's home for troubleshooting donning and doffing. Ppt reminded of Flexi precautions.   RLE CircAid compression legging continues to work well for Pt when she is able to get assistance with donning. Pt requires the same size legging for the LLE. Pt will order  leggings she has been looking at on line are the lighter duty alternative compression wraps and are not expected to provide optimum compression or containment.   Please see progress towards goals to date and narrative from 11/01/23 below for progress to date. Pt is DC from OT for LE care today. Pt agrees with plan to call PRN for support w LE management phase of self care.   (11/01/23 PROGRESS  NOTE: Today marks the transition from modified intensive phase CDT  to self-management phase of CDT. Pt has obtained an advanced sequential compression device, the Flexitouch, to assist with home,e lymphedema self-management. Unlike the basic vaso-pneumatic devices, the Flexitouch follows lymphatic anatomy using 32 chambers and includes treatment to involved proximal pathways, including buttocks, hips and abdomen. She reports she uses the Flexitouch daily as directed. Completed fitting of off the shelf CircAid knee high, Velcro wrap style, compression legging on the R leg. Fit achieved is excellent! Pt is pleased with the fit and comfort. Pt educated edu re LE self-care, including wear and care, donning and doffing and gauging compression on New CircAid garment alternative. Reviewed progress towards goals to date, importance of consistent , daily self care for limiting LE progression, and discussed POC going forward. Reviewed LE precautions, including signs/ symptoms of cellulitis and DVT. Post test LLIS score meets and exceeds goal with a 31.72% reduction. Pt in agreement with plan to obtain additional CircAid for the LLE. She also agrees with plan to reduce Rx frequency to follow up in 8 weeks and PRN. )  (09/13/23  [facility] TRIAL: Amily Depp presents with moderate-severe, stage II, BLE Lipo-lymphedema involving bilateral lower extremities, hips, buttocks and abdomen. Pt has undergone OT for CDT since 07/12/23. Pt has used the [employer] basic NIMBL sequential pneumatic compression device, or pump, for more than 4 weeks with persistent chronic, progressive swelling and associated pain. The basic pump has failed to control swelling and reduce pain. An advanced Flexitouch device is medically necessary to decongest the lower quadrant hips, buttocks and abdomen to facilitate lymphatic decongestion out of the limbs and through proximal pathways above the groin to the thoracic duct.   Moderate-Severe,  Stage  II, Bilateral Lower Extremity Lipo- Lymphedema 2/2  Severe, Type III Lipedema involving BLE, buttocks, abdomen and hips DIAGNOSIS: [x]  Lymphedema, not elsewhere classified [I89.0]  []  Hereditary lymphedema [Q 82.0]  []  Postmastectomy lymphedema [I 97.2]   []  Chronic Venous Stasis ulcers [I 87.2] (non-healing despite 6 months of ongoing treatment)  SWELLING SEVERITY:  International Society of Lymphology clinical classification: []  Stage 0: Latent or subclinical condition where swelling is not yet evident despite impaired lymph transport, subtle alterations in tissue fluid/composition and changes in subjective symptoms.  It may exist months or years before overt edema occurs.  []  Stage I: Early accumulation of fluid relatively high in protein content (e.G., in comparison with "venous" edema) which subsides with limb elevation.  Pitting may occur.  An increase in various types of proliferating cells may also be seen.  [x]  Stage II: Limb elevation alone rarely reduces the tissue swelling and pitting is manifest.  Later in Stage II, the limb may not pit as excess subcutaneous fat and fibrosis develop.  []  Stage III: Lymphostatic elephantiasis where pitting can be absent and trophic skin changes such as acanthosis, alterations in skin character and thickness, further deposition of fat and fibrosis, and warty overgrowths have developed.   SYMPTOMS: Patient exhibits the following symptoms despite conservative therapy: []  Hyperkeratosis  [x]  Hyperplasia  [x]  Hyperpigmentation  []  Skin breakdown with lymphorrhea (skin weeping)  []  Papillomatosis  []  Recurrent cellulitis  [x]  Fibrosis   []  Elephantiasis [x]  Progressive edema [x]  Truncal/abdominal swelling []  Chest/axillary swelling []  Genital swelling [x]  Unable to control swelling [x]  Impaired ROM [x]  Impaired mobility [x]  Pain []  Scarring CONSERVATIVE TREATMENT: Patient has tried conservative treatments:  [x]  Yes []  No  If YES,  specify type of conservative treatment and duration of use: Compression garments:    []  <4 weeks   []   1-5 months   []  6-12 months  []  >1 year Bandaging:     []  <4 weeks   [x]  1-5 months   []  6-12 months  []  >1 year Elevation:       []  <4 weeks   [x]  1-5 months   []  6-12 months  []  >1 year  Exercise:         []  <4 weeks   [x]  1-5 months   []  6-12 months  []  >1 year  Complete Decongestive Therapy (CDT) []  <4 weeks   [x]  1-5 months   []  6-12 months  []  >1 year  Instructed on self-manual lymphatic drainage techniques (self-MLD): [x]  Yes []  No Instructed on appropriate skin/nail care practices:    [x]  Yes []  No Evaluation of diet and medications:      [x]  Yes []  No  PNEUMATIC COMPRESSION DEVICE EVALUATION: Patient has tried an Z9348 basic pneumatic compression device?  [x]  Yes []  No  If YES: Z9348 basic pneumatic compression device [employer] NIM[employer] was used at 30-400 mmHg for:  []  <4 weeks   [x]  1-5 months   []  6-12 months  []  >1 year  []  Discontinued use prior to 4 weeks due to negative symptoms Was the E0651 basic pneumatic compression device effective in managing the patient's lymphedema?  []  Yes []  No   If NO, an [id number] was not used, the following symptoms demonstrate why it was ineffective or deemed not clinically appropriate to manage the patient's lymphedema: []  E0651 basic pneumatic compression device caused or exacerbated swelling proximal to pump sleeve (top of thigh, genitals, abdomen, hips, buttocks, chest, etc.)  []  Z9348 basic pneumatic compression device was unable to accommodate limb size  []  Patient unable to tolerate or do not recommend E0651 basic pneumatic compression device due to pain/compromised skin integrity (e.G., open wounds/ulcers, scarring, sensitivity)  [x]  Z9348 basic pneumatic compression device did not control swelling or result in clinical improvement  Is an E0652 advanced pneumatic compression device medically necessary to treat the clinical symptoms listed above  that prevent effective treatment with the Z9348 basic pneumatic compression device? [x]  Yes []  No   TREATMENT PLAN: Patient to complete or continue the following treatment(s):  Compression Garments and/or Bandaging   Yes [x]  No Exercise       [x]  Yes []  No Elevation      [x]  Yes []  No E0651 Basic Pneumatic Compression Device   []  Yes [x]  No E0652 Advanced Pneumatic Compression Device  [x]  Yes []  No Professional Lymphedema Treatment (CDT)  [x]  Yes []  No Self-MLD       [x]  Yes []  No   MEASUREMENTS: NIMBL Pre and post trial circumferential measurements reveal increases in circumferences at all anatomical landmarks over time. These data demonstrate a failure of the NIMBL basic sequential pneumatic device for lymphedema control.   Lower extremity measurements (measure the largest circumferential point of the listed area):  Date: 04/20/23 NIMBL Pre trial  Date: 04/20/23 NIMBL Pre trial  [x]   Right Leg  Measurements   [x]   Left Leg Measurements  Inseam 63 cm  Inseam 63 cm  Thigh 87 cm  Thigh 84 cm  Knee  58 cm  Knee 61 cm  Calf 47 cm  Calf 48 cm  Ankle 27 cm  Ankle 28 cm  Hips 160 cm  Hips _________cm  Abdomen/Trunk _________cm  Abdomen/Trunk _________cm    Date:09/13/23 NIMBL Post trial  Date:09/13/23  NIMBL Post trial   [x]   Right Leg  Measurements   [  x]  Left Leg Measurements    Inseam _________cm  Inseam _________cm   Thigh 93.5 cm  Thigh 96 cm   Knee 60.5 cm  Knee 61.5 cm   Calf 60 cm  Calf  61 cm   Ankle 34 cm  Ankle 35 cm   Hips 168 cm  Hips _________cm   Abdomen _________cm  Abdomen _________cm     Clinician Signature: TG  (07/12/23 Initial OT eval: Kayln Garceau is a 63 yo female presenting with moderate to sever, stage II, lipo-lymphedema 2/2 to Type III LIPEDEMA, Class III Obesity, and suspected venous insufficiency. Her condition involves buttocks, abdomen and bilateral lower extremities. It is exacerbated by joint inflammation, obstructive sleep apnea and  hypertension. This patient has a complex constellation of contributing factors.  Lipedema typically affects women and is characterized by painful, symmetrical, excessive deposition of fat below the waist in the legs, thighs and buttocks resulting in a disproportionate waist to hip ratio. It may also include the arms. Unlike lymphedema where swelling extends to the feet and presents as asymmetrical, the feet are spared and cuff-like accumulation of thickened, fibrotic, fatty tissue is noted at distal legs. Accompanying class III obesity favors the development of lipo-lymphedema, and/ or obesity-induced lymphedema. As lipedema progresses fatty fibrosis accumulates in the subcutis putting pressure on delicate lymphatics resulting in excessive lymph accumulation and progressive lymphedema. The combined condition of lipo-lymphedema is difficult to treat as lipedema does not typically respond well to Complete Decongestive Therapy (CDT), while lymphedema may respond to varying degrees. Well fitting, comfortable compression garments that do not constrict or roll down often provide pain relief and enable improved functional performance. An advanced, sequential, pneumatic , compression device, The Flexitouch from [employer], may reduce pain and discomfort while helping to reduce lymphatic congestion by incorporating anatomically correct lymphatic channels into the device garments.  Extreme obesity can cause lower extremity lymphedema, termed obesity-induced lymphedema (OIL). Obesity-induced lymphedema is secondary lymphedema that may occur once an individual's body mass index (BMI) exceeds 40. Ms Womble-Miller's BMI measures 55.6 today. The risk of lymphatic dysfunction increases with elevated BMI and is almost universal once BMI exceeds 60. Patients with obesity-induced lymphedema also may develop areas of massive, localized lymphedema. Individuals with OIL are in an unfavorable cycle of weight gain and lymphatic  injury. As BMI increases lymphedema worsens, ambulation becomes more difficult, and BMI further rises. The fundamental treatment for OIL is weight loss.   Ms. Sharren Watkins will benefit from skilled Occupational Therapy to reduce limb swelling and associated pain, to limit progression and infection risk. BLE lymphedema limits functional performance in all occupational domains, including functional mobility and ambulation,  basic and instrumental ADLs, productive activities, leisure pursuits, social participation and quality of life. This patient will benefit from modified Intensive and Self Management Phase CDT . In addition to MLD, ther ex, skin care and compression bandaging below the knees, Pt will be fitted with custom knee length compression stockings paired with off the shelf , Capri length compression leggings. If insurance benefits allow, she will undergo a trial in the clinic with the advanced Flexitouch device in an effort to reduce discomfort and reduce infection risk. Without skilled OT Li-lymphedema will worsen over time and further functional decline is expected. )   OBJECTIVE IMPAIRMENTS: Abnormal gait, decreased balance, decreased knowledge of condition, decreased knowledge of use of DME, decreased mobility, difficulty walking, decreased ROM, decreased strength, increased edema, obesity, pain, and lymphedema -related pain and chronic limb swelling.  ACTIVITY LIMITATIONS: Mobility and functional ambulation limitations:  abnormal gait pattern, carrying, lifting, bending, sitting, standing, squatting, stairs, transfers, and bed mobility Basic and instrumental ADLs (reaching feet and distal legs to groom nails, inspect skin, apply lotion, bathe lower body, difficulty with LB dressing, fitting shoes and socks, impaired sleeping, meal prep, standing to cook, driving, shopping, yard work, house work Paediatric Nurse activities: work related activities requiring extended standing, walking, sitting;  caring for others Social participation in the community, socializing with others, LE affects my body image completely, interferes w intimacy Leisure pursuits requiring extended standing, walking, sitting  PERSONAL FACTORS: Age, Past/current experiences, Time since onset of injury/illness/exacerbation, and 3+ comorbidities: lipedema, class III Obesity, OSA, arthritis are also affecting patient's functional outcome.   REHAB POTENTIAL:Pt has difficulty reaching feet, so requires assistance applying and removing multi-layer compression bandaging at home daily between OT visits to achieve  optimal limb volume reduction and functional outcomes. Without consistent, daily caregiver assistance with compression wrapping throughout CDT prognosis is poor. With daily assistance with wrapping, prognosis is fair due to BMI  and Lipedema co-morbidities   EVALUATION COMPLEXITY: Complex  GOALS: Goals reviewed with patient? Yes  SHORT TERM GOALS: Target date: 4th OT Rx visit   Pt will demonstrate understanding of lymphedema precautions and prevention strategies with modified independence using a printed reference to identify at least 5 precautions and discussing how s/he may implement them into daily life to reduce risk of progression with extra time. Baseline:Max A Goal status: GOAL MET  2.  Pt will be able to apply multilayer, knee length, gradient, compression wraps to one leg at a time from toes to below knee with Max caregiver A x 1 to decrease limb volume, to limit infection risk, and to limit lymphedema progression.  Baseline: Dependent Goal status: GOAL MET  LONG TERM GOALS: Target date: 08/08/23 1.Given this patient's Intake score of 57.35 % on the Lymphedema Life Impact Scale (LLIS), patient will experience a reduction of at least 10 points in her perceived level of functional impairment resulting from lymphedema to improve functional performance and quality of life (QOL). Baseline: 57.35 % Goal  status:PROGRESSING  2.  Pt will achieve at least a 10% volume reduction in B legs to return limb to typical size and shape, to limit infection risk and LE progression, to decrease pain, to improve function. Baseline: Dependent Goal status: PROGRESSING: To date R LEG volume is significantly increased by 23.8%  3.  Pt will obtain appropriate compression garments/devices and achieve modified independence (extra time + assistive devices) with donning/doffing to optimize limb volume reductions and limit LE progression over time. Baseline: Dependent Goal status: GOAL MET  During Intensive phase CDT, with modified independence, Pt will achieve at least 85% compliance with all lymphedema self-care home program components, including daily skin care, compression wraps and /or garments, simple self MLD and lymphatic pumping therex to habituate LE self care protocol  into ADLs for optimal LE self-management over time. Baseline: Dependent Goal status: PROGRESSING- consistent wrapping remains challenging. Pt reports wrapping about 50% of the time.  PLAN:  OT FREQUENCY: 2x/week and PRN  OT DURATION: other: 18 weeks and PRN  PLANNED INTERVENTIONS: Complete Decongestive Therapy (CDT): Manual Lymphatic Drainage (MLD) , Skin care, ther ex, gradient [id number]-Therapeutic exercises, 97530- Therapeutic activity, 97535- Self Care, [id number]- Manual therapy, Patient/Family education, Manual lymph drainage, Compression bandaging, DME instructions, and trial with advanced, sequential, pneumatic, compression device ([employer] Flexitouch) Custom-made gradient compression garments and HOS devices are medically necessary because  they are uniquely sized and shaped to fit the exact dimensions of the affected extremities, and to provide appropriate medical grade, graduated compression essential for optimally managing chronic, progressive lymphedema. Multiple custom compression garments are needed to ensure proper  hygiene to limit infection risk. Custom compression garments should be replaced q 3-6 months When worn consistently for optimal lipo-lymphedema self-management over time. HOS devices, medically necessary to limit fibrosis buildup in tissue, should be replaced q 2 years and PRN when worn out.   PLAN FOR NEXT SESSION:  Continue MLD and compression wraps as established. Continue skin care during MLD to reduce infection risk.  Continue teaching lymphedema self-care  Zebedee Dec, MS, OTR/L, CLT-LANA 01/03/24 8:40 AM

## 2024-01-10 ENCOUNTER — Ambulatory Visit: Admitting: Occupational Therapy

## 2024-01-17 ENCOUNTER — Ambulatory Visit: Admitting: Occupational Therapy

## 2024-01-17 ENCOUNTER — Encounter: Admitting: Occupational Therapy

## 2024-01-24 ENCOUNTER — Encounter: Payer: Self-pay | Admitting: Nurse Practitioner

## 2024-01-24 ENCOUNTER — Ambulatory Visit: Admitting: Occupational Therapy

## 2024-01-26 MED ORDER — IRBESARTAN 75 MG PO TABS: 75.0000 mg | ORAL_TABLET | Freq: Every day | ORAL | 2 refills | Status: AC

## 2024-01-26 MED ORDER — ATORVASTATIN CALCIUM 20 MG PO TABS: 20.0000 mg | ORAL_TABLET | Freq: Every day | ORAL | 2 refills | Status: AC

## 2024-01-28 NOTE — Progress Notes (Signed)
 "   NEUROPSYCHOLOGICAL EVALUATION [place]  Physical Medicine and Rehabilitation     Patient: Angel Watkins  MRN: [id number] DOB: 1960-03-23  Age: 63 y.o. Sex: female  Race/Ethnicity: Black or African American  Years of Education: 16 Handedness: Right  Referring Provider: True Mar, MD, PhD  Provider/Clinical Neuropsychologist: Evalene DOROTHA Riff, PsyD  Date of Service: 11/04/2023 Start Time: 10 AM End Time: 11 AM Location of Service:  [facility] Physical Medicine & Rehabilitation Department [address], [address], [place], [place] [address] Billing Code/Service: [id number]  Individuals Present: Evalene Riff, PsyD 1 hour was spent on interpretation of patient data, interpretation of standardized test results and clinical data, clinical decision making, initial treatment planning/recommendations, and report writing. The report will be amended as needed based on any additional information collected during interactive feedback session.   REASON FOR REFERRAL:The patient was referred by Dr. Mar from [facility] on 07/21/2023 for neuropsychological evaluation for cognitive testing within the context of subjective cognitive complaints and mildly abnormal score on MoCA (20/30). Per records from the referring provider and record review, the patients medical history includes arthritis, back pain, diabetes, lower extremity swelling, lymphedema, vitamin D  deficiency, fibromyalgia, hypertension, anxiety, palpitations, spinal stenosis, sleep apnea, obesity, s/p gastric bypass surgery many years prior. Cognitive complaints were reported to have been present for at least 1 year. Intermittent tremors were also reported to be present during that duration. She has a family history of Parkinsons disease (PD) or parkinsonism. Per notes from her initial neurology consult on 07/21/23, examination did not show telltale signs of PD or parkinsonism, nor essential  tremor. Neurological exam was reported to be reassuring in that regard. Cognitive symptoms may reflect multifactorial etiology, per neurology. Further workup was ordered including MRI, sleep study, and checking B12 level with her PCP. Some sleep difficulties were noted. 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  Visit notes indicated she has had some difficulty sequencing work.  No difficulties with driving. MRI of the brain (with and without contrast) was completed on 11/09/23 and all findings were reportedly normal.   Upon interview, the patient expressed concerns regarding her cognitive and physical symptoms.  The provider described the evaluation process in detail and discussed with the patient the reason for the evaluation and how results may provide insight into her cognitive difficulties  HISTORY OF PRESENTING CONCERNS:  Cognitive Symptom Onset & Course: Patient described a gradual onset of cognitive symptoms approximately 1 year ago.  She indicated that she suspects that symptoms have slowly progressed over time.  The patient noted multiple significant life events within the last few years including the passing of her mother in 2018, her mother-in-law shortly after, the loss of her dad in 2021, and her grandmother in 2024.  Patient reported first onset of difficulties with depression around the time of the loss of her mother.  Depressive symptoms have persisted although are less severe relative to a few years ago. Cognitive Complaints:  Memory: The patient endorsed relatively infrequent difficulties remembering details from conversations.  She denied problems with remembering recent events or frequently misplacing things.  She indicated that former coworkers of hers have told her that she repeats herself without realizing it although this has been long running and present since at least 2011.  She denies significant  problems tracking her medical appointments and managing her medications.  She denied any problems with getting lost in  familiar locations. Processing Speed: Patient reported no noticeable changes with respect to processing speed. Attention & Concentration: The patient indicated that she has noticed changes with respect to attention and concentration such that she will be easily sidetracked due to both internal and external distractions.  She reported jumping to different tasks before completing the first one and ultimately less efficient at work and at home.  She endorsed occasional difficulty with losing her train of thought.  Patient described indications that she is capable of sustaining attention over time however. Language: The patient Dors difficulties with word finding.  She denies instances of others pointing out to her that she is unintentionally using the wrong word.  She denied any problems with receptive language in conversation.  She denied any significant changes in comprehension and reading although noted some attentional interference.  She indicated she has not noticed any significant changes with mental arithmetic and denied any problems with writing composition. Visual-Spatial: Based on the patient's descriptions, there is no clear indications of difficulty with visual-spatial skills. Executive Functioning: The patient denied any changes in her abilities to make decisions or solve day-to-day problems.  She denied changes in her ability to plan or stay organized.  She reported no clear indications of changes in personality or behavior.  Motor/Sensory Complaints:   Sensory changes: No difficulties or significant changes in her sense of smell, taste, hearing, or vision. Balance/coordination difficulties: The patient reported some difficulties with balance but indicated these are due to identified causes.  She did describe one slightly atypical.  In which my feet just did not want to move  occurring around the time of loss of her mother.  One fall resulted from this.  This is since resolved. Frequent instances of dizziness/vertigo: None. Other motor difficulties: As noted, records from neurology indicated generally reassuring results from exam with respect to parkinsonism or essential tremor.  The patient expressed awareness of this finding.  Patient reported that she began experiencing intermittent tremors around 9 to 10 months ago involving her hands.  It is presents if holding a coffee cup or phone for extended period of time.  She indicated that her spouse indicated that her bottom lip will sometimes tremor although she has not noticed this.  Patient describes some changes in her handwriting although noted that she underwent a right shoulder replacement in March of this year.  She denied any significant changes in handwriting aside from those attributable to this.  Emotional and Behavioral Functioning:  History: The patient denied any significant history of depression or anxiety prior to the loss of her mother.  She did describe a period of increased frequency of panic attack during adulthood and indicated that it was related to a trauma that she experienced.  She reported no clear indications of active PTSD symptomology.  Patient noted that the recent loss of loved ones family members has been particularly challenging for her as she is an only child and has no children herself.  She did however indicate that she does have loved ones in her life still.  The patient had been involved in individual therapy for.  After the death of her first husband.  She had no other instances of treatment other than medication which started around the time over the mother's death. Depression: Patient denied feeling down or blue more days than not but her mood is lower relative to her premorbid baseline.  She endorsed more easily being brought to tears.  She denied indications of anhedonia and reported  that  her energy is relatively good overall.  She denied any current or past suicidal ideation or other clear indications of depression.  She reported that the medication she has been on has been beneficial for evening out her mood and helping her maintain perspective. Anxiety: She denied problems with generalized worry, difficulties relaxing, frequent feelings of tension. Other: She denied any current or past suicidal ideation, homicidal ideation, paranoia, hallucinations, indications of mania, inpatient psychiatric admission.  She denied any significant life stressors at present.  Sleep: Denied significant difficulties with sleep onset.  Typically sleeps about 6 hours on a good night and 4 hours on a bad night.  She indicated that sleep is good approximately five nights of the week.  She reported that her difficulties are primarily related to waking up during the night, sometimes due to back pain, and struggles with returning to sleep.  She indicated she typically needs about 6 hours of sleep to feel rested.  She denied any reports of RBD's.  She indicated that her recent sleep apnea evaluation, consistent with records, was mild. Appetite: No significant changes in weight or difficulties with appetite. Caffeine: 2 cups of coffee each morning on average. Alcohol Use: None Tobacco Use: None Recreational Substance Use: None  Level of Functional Independence: The patient is intact with both basic and instrumental activities of daily living.  Psychosocial: Marital Status: Married to her current husband for 18 years. One prior marriage (widowed) Children/Grandchildren: No Living Situation: Lives lone with spouse.  Daily Activities/Hobbies: [place], going to the movies, cruise around town with her husband.   Academic/Vocational History: Years completed: 44 Grades/Performance: B student Learning difficulties in early education: No Attention or behavioral problems during her youth:  No Currently works as an environmental health practitioner for [employer]. She has been with the city of [place] for 10.5 years. She previously worked for [employer]. She worked there for 23 years until their closure. She indicated work is going well and is able to perform her duties without concern, but may be slightly less efficient due to the previously mentioned attention difficulties. She is looking forward to retirement in about 19 months.   Medical History/Record Review: History of traumatic brain injury/concussion: None History of stroke: None History of heart attack: None History of cancer/chemotherapy: None History of seizure activity: None Symptoms of chronic pain: Yes.  Average pain levels are six or 7 out of 10.  Highest pain levels are an 8-9 out of 10 which occurs once or twice per week. Experience of frequent headaches/migraines: Over the last 6 months has been experiencing, on average, one headache a week that lasts for 2 hours on average.  She suspects there are tension headaches.  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/02/2023   Chronic pain syndrome 11/02/2023   Morbid obesity (HCC) 07/14/2023   Hyperlipidemia associated with type 2 diabetes mellitus (HCC) 05/29/2023   SOBOE (shortness of breath on exertion) 05/24/2023   Lymphedema 03/08/2023   Anxiety and depression 12/06/2022   Eating  disorder 12/02/2020   Vitamin D  deficiency 12/02/2020   Type 2 diabetes mellitus with hyperglycemia, without long-term current use of insulin  (HCC) 07/20/2020   Hypertension associated with diabetes (HCC) 07/20/2020   Class 3 severe obesity with serious comorbidity and body mass index (BMI) of 50.0 to 59.9 in adult [facility]) 07/20/2020   Cervical radiculopathy 12/03/2018   Family Neurologic/Medical Hx:  Family History  Problem Relation Age of Onset   Hypertension Mother    Cancer Mother        breast and colon   Sleep apnea Mother    Obesity Mother    Diabetes Father    Hypertension Father    Stroke Father    Parkinson's disease Father    Dementia Maternal Grandmother    Parkinson's disease Paternal Grandmother    Dementia Paternal Grandmother    Medications: Per records atorvastatin  (LIPITOR) 20 MG tablet Take 20 mg by mouth daily.  buprenorphine  (BUTRANS ) 10 MCG/HR PTWKPlace 1 patch onto the skin once a week. Cholecalciferol (VITAMIN D3) 125 MCG (5000 UT) CAPS Take 1 capsule (5,000 Units total) by mouth daily. Cyanocobalamin  (VITAMIN B-12 PO) Take 1 tablet by mouth daily. diclofenac  (VOLTAREN ) 75 MG EC tablet Take 75 mg by mouth 2 (two) times daily. ferrous sulfate 325 (65 FE) MG tablet Take 325 mg by mouth daily with breakfast. irbesartan  (AVAPRO ) 75 MG tablet Take 75 mg by mouth daily. omeprazole (PRILOSEC) 20 MG capsule Take 20 mg by mouth daily. Semaglutide , 1 MG/DOSE, 4 MG/3ML SOPN Inject 1 mg as directed once a week. sertraline  (ZOLOFT ) 50 MG tablet Take 1.5 tablets (75 mg total) by mouth daily. torsemide  (DEMADEX ) 20 MG tablet Take 20 mg by mouth daily.  NEUROPSYCHODIAGNOSTIC FINDINGS: Mental Status/Behavioral Observations (11/28/2023):  Orientation: The patient was oriented to self, place, and time. Sensory/Arousal: Hearing and vision were adequate for testing. The patient was alert. Appearance: Dress and hygiene were appropriate for the setting.  Speech/Language: In  conversation, the patient's speech was prosodic, fluent, and well-articulated. The patient displayed no indications of word finding difficulties and no word substitution errors were observed.  Motor: The patient ambulated with the assistance of a cane. No tremors were observed.  Social Comportment: Social behavior was appropriate to the setting. Mood/Affect: Mood was largely neutral to positive. Affect was consistent with mood.  Attention/Concentration:  The patient appeared to maintain consistent engagement throughout testing. No frank attentional lapses were observed.  Thought Process/Content: The patient's thought process was coherent, linear, goal directed. There were no indications of psychosis.  Additional Observations: The patient showed no difficulties with understanding task instructions. No difficulties with frustration tolerance were noted.  Tests Administered: Automatic Data Edition (BNT-2) Brief Visuospatial Memory Test-Revised (BVMT-R) Benton Judgment of Line Orientation (JOLO) [place]  Verbal Learning Test-Third Edition (CVLT-3) Clock Drawing Test Controlled Oral Word Association Test (FAS & Animals) Delis-Kaplan Executive Function System (D-KEFS), select subtests Rey Complex Figure Test (RCFT), select subtests Trail Making Test (TMT; Part A & B) Wechsler Adult Intelligence Scale-Fourth Edition (WAIS-IV), select subtests Wechsler Memory Scale-Fourth Edition (WMS-IV) , select subtests Wechsler Memory Scale-Third Edition (WMS-III), select subtests  Wechsler Test of Adult Reading (WTAR) The Beck Depression Inventory-II (BDI-II) Almarie  Anxiety Inventory (BAI)  Test Scores Test scores are relative to age and are demographically adjusted when appropriate.  Measurement properties of test scores: Standard Scores (SS): Mean = 100; Standard Deviation = 15; Scaled Scores (ss): Mean = 10; Standard Deviation = 3; Z scores (Z): Mean = 0; Standard Deviation = 1; T scores (T);  Mean = 50; Standard Deviation = 10  Performance validity: Embedded and stand-alone performance validity metrics were all within normal limits and support the interpretation of the patient's test performances as a reliable and valid estimate of the patient's cognitive abilities.   Intellectual/Premorbid Functioning Estimate   Norm Score Percentile  Range  Wechsler Test of Adult Reading  SS = 111 77 %ile Average  Patient's intellectual abilities are estimated to be within the average to high average range based on her performance on a word reading/recognition measure and her personal history.  ATTENTION AND WORKING MEMORY    Norm Score Percentile  Range  WAIS-IV          Digit Span  ss = 12 75 %ile High Average   DSF  ss = 11 63 %ile Average   Span:    7      DSB  ss = 12 75 %ile High Average   Span:    6      DSS  ss = 11 63 %ile Average   Span:    6     WMS-III          Spatial Span  ss = 8 25 %ile Average   SSF  ss = 9 37 %ile Average   Span:    5      SSB  ss = 9 37 %ile Average   Span:    4     The patient's auditory-verbal attention and working memory performance was scored in the high average range by age overall.  She scored consistently across subtests on this measure.  Her performance was average for visual attention and working memory and again consistent across subtests. PROCESSING SPEED    Norm Score Percentile  Range  WAIS-IV          Coding  ss = 11 63 %ile Average   Symbol Search  ss = 13 84 %ile High Average  The patient's performances on measures of processing speed were average on a rapid digit symbol translation task and high average on a visual discrimination task, both by age. LANGUAGE    Norm Score Percentile  Range  Boston Naming Test (BNT-2)  T = 42 21 %ile Low Average  COWAT          FAS  T = 39 13 %ile Low Average   Animals  T = 58 79 %ile High Average  The patient's confrontation naming/word retrieval performance was, relative to combined age and education,  low average.  However, she scored high average with semantic verbal fluency.  Her performance on the phonemic verbal fluency task was low average. EXECUTIVE FUNCTIONING    Norm Score Percentile  Range  DKEFS - Color-Word Interference          Color Naming  ss = 13 84 %ile High Average   Word Reading  ss = 11 63 %ile Average   Inhibition  ss = 12 75 %ile High Average   Errors  ss = 11 63 %ile Average   Inhibition Switching  ss = 12 75 %ile High Average   Errors  ss = 13 84 %ile  High Average            Trail Making Test                TMT A  T = 52 58 %ile Average         TMT B  T = 61 86 %ile High Average  The patient had no difficulty on the basic response inhibition task on which she performed in the high average range for speed and average range for accuracy.  Her performance was average in speed relative to her baseline color naming speed.  She again had no difficulty on the combination response inhibition/set shifting trial and that she scored high average by age and speed and accuracy.  By age and education her speeded sequencing was average and the addition of a set shifting element pose no challenge as she performed within the high average range on that trial. MEMORY    Norm Score Percentile  Range  BVMT-R          Trial 1  T = 53.0 61 %ile Average   Trial 2  T = 65 94 %ile Above Average   Trial 3  T = 65.0 94 %ile Above Average   Total Recall  T = 62.0 88 %ile High Average   Learning  T = 63.0 91 %ile Above Average   Delayed Recall  T = 66.0 95 %ile Above Average   % Retained    100 >16 %ile WNL   Hits     >16 %ile WNL   False Alarms     >16 %ile WNL   Recognition Discriminability     >16 %ile WNL  CVLT-III          Trial 1  ss = 10.0 50 %ile Average   Trial 2  ss = 9.0 37 %ile Average   Trial 3  ss = 10.0 50 %ile Average   Trial 4  ss = 10.0 50 %ile Average   Trial 5  ss = 6.0 9 %ile Low Average   Trial B  ss = 11.0 63 %ile Average   Short Delay Free Recall  ss = 9.0 37 %ile  Average   Short Delay Cued Recall  ss = 9.0 37 %ile Average   Long Delay Free Recall  ss = 9.0 37 %ile Average   Long Delay Cued Recall  ss = 10.0 50 %ile Average   Total Hits  ss = 10.0 50 %ile Average   Total False Positives  ss = 11.0 63 %ile Average   Recognition Discriminability  ss = 12.0 75 %ile High Average   Total Intrusions  ss = 14.0 91 %ile Above Average   Trials 1-5 Total Correct  SS = 94 34 %ile Average   Total Repetitions  ss = 11.0 63 %ile Average   List B vs. Trial 1  ss = 11.0 63 %ile Average   SD (FR) vs. Trial 5 Correct  ss = 13.0 84 %ile High Average   LD (FR)vs. SD (FR)  ss = 11.0 63 %ile Average  Wechsler Memory Scale, 4th Edition (WMS-4)         Log. Mem. Immediate Recall  ss = 13 84 %ile High Average   Logical Memory Delayed Recall  ss = 14 91 %ile Above Average   Logical Recognition    >75th   %ile High Average  The patient's performance on a visual memory test was quite strong with high average  overall encoding/immediate recall across the three learning trials.  Her relative gains from repetition was scored in the above average range.  Following a distraction fill delay she performed above average for delayed free recall and retained 100% of the information she initially encoded.  She made no errors on the delayed recognition task.  On an auditory-verbal memory test involving a word list she scored predominantly within the average range for immediate recall on the five learning trials and scored average for overall information encoded across those trials she had no difficulties managing a interference list.  Her performances for free and cued recall following short and long delays were all within the average range.  She had no difficulties on the delayed recognition task scoring high average and overall discriminability.  On the auditory-verbal memory test involving two short stories she scored high average for immediate recall, above average for delayed free recall,  and high average for delayed recognition. VISUAL-SPATIAL    Norm Score Percentile  Range  Benton JOLO  ss = 9 37 %ile Average  Rey Complex Figure Copy       >16 %ile WNL  Clock         Her basic judgment of Line orientation was scored within the average range.  She had no difficulties when asked to copy a complex geometric figure.  Her clock drawing showed no indications of deficits in construction or semantics. PERSONALITY AND BEHAVIORAL FUNCTIONING      Score/Interpretation  BDI Raw       13  BDI Severity       Minimal.  BAI Raw       17  BAI Severity       Moderate.  Self-report measures of depression and anxiety showed sub-clinical symptoms of depression (albeit at the upper limit of the normal range), and moderate severity score with anxiety related symptoms.   SUMMARY / CLINICAL IMPRESSIONS The patient was referred by Dr. Buck from [facility] on 07/21/2023 for neuropsychological evaluation for cognitive testing within the context of subjective cognitive complaints and mildly abnormal score on MoCA (20/30). Per records from the referring provider and record review, the patients medical history includes arthritis, back pain, diabetes, lower extremity swelling, lymphedema, vitamin D  deficiency, fibromyalgia, hypertension, anxiety, palpitations, spinal stenosis, sleep apnea, obesity, s/p gastric bypass surgery many years prior. Cognitive complaints were reported to have been present for at least 1 year. Intermittent tremors were also reported to be present during that duration. She has a family history of Parkinsons disease (PD) or parkinsonism. Per notes from her initial neurology consult on 07/21/23, examination did not show telltale signs of PD or parkinsonism, nor essential tremor. Neurological exam was reported to be reassuring in that regard. Cognitive symptoms may reflect multifactorial etiology, per neurology. Further workup was ordered including MRI, sleep study, and  checking B12 level with her PCP. Some sleep difficulties were noted. 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  Visit notes indicated she has had some difficulty sequencing work.  No difficulties with driving. MRI of the brain (with and without contrast) was completed on 11/09/23 and all findings were reportedly normal.   Upon interview, the patient expressed concerns regarding her cognitive and physical symptoms. The patient described some minor difficulties with short-term memory, more prominent difficulties with attention and concentration, a word finding difficulties.  Activities of daily living are intact. She works full-time.  Patient has experienced notable  losses over recent years and some.  Symptoms of depression are relatively well-managed with medication but are present at a low level.  She describes minimal indications of anxiety or worry, but expressed concerns about her family history and fears that she will develop Parkinson's and dementia.   The patient's cognitive test scores are reassuring with no evidence of broad-based cognitive decline or compelling deficit patterns concerning for neurological etiology. Scores were slightly low on only two measures, confrontation-naming and phonemic verbal fluency. The performances were not significantly below expectations, and absence of semantic and executive deficits on other measures reduces concerns further. Scores on measures of attention and concentration, executive functioning, processing speed, memory, and visual spatial abilities were at or even above expectations. She demonstrated abilities better than the norm in many instances.    The absence of compelling indications of decline on objective testing combined with the patients level of independent functioning preclude a formal neurocognitive disorder diagnosis, and combination available data sources is reassuring  with respect to neurodegenerative pathology. The patient presents with significant chronic pain and sleep difficulties. These factors can readily impact cognitive functioning in day to day life. The patient also continues to experience some mild indications of depressive symptoms, which could further exacerbate things. Improvements in these areas may benefit the patient's cognitive functioning and permit her to utilize her abilities to their fullest, which are quite strong. Results are reassuring overall, but the option for re-evaluation to assess for change over time exists. If felt beneficial, a re-evaluation can be conducted in 63-months. If there are no difficulties in 12 months, a re-evaluation is not necessary.   Diagnosis: Subjective cognitive impairment Anxiety and depression.   Recommendations:  Follow up with the referring provider as needed.  Results are reassuring overall as performances are overwhelmingly average or better. The current evaluation can serve as a baseline for future comparison via re-evaluation, if beneficial.  Continue to work with medical providers for management of chronic medical conditions is recommended. Management of cardiovascular and endocrine related conditions is important for overall physical health and poor control can impact cognitive functioning. Difficulties with chronic pain and sleep are similarly important, but primarily due to the transient impact problems in these areas can have on cognitive functioning.  Relaxation exercise handouts and tips for chronic pain management are included in the copy of the report mailed to the patient.  If desired, individual therapy can be helpful if the patient desires additional psychotherapeutic support; www.[email or web address] has a large list of community providers that can be filtered by helpful variables. Referrals within [name] system can also be submitted as needed.  Please feel free to reach out if any questions  or concerns emerge upon receipt of this report.    Evalene DOROTHA Riff, PsyD [facility] [address] [place], [place] [address] Main: [phone] Fax: [phone] [place] License # [id number]  This report was generated using voice recognition software. While this document has been carefully reviewed, transcription errors may be present. I apologize in advance for any inconvenience. Please contact me if further clarification is needed.  "

## 2024-01-28 NOTE — Progress Notes (Signed)
" ° °  NEUROPSYCHOLOGICAL EVALUATION [place]. [facility]  Physical Medicine and Rehabilitation     Patient: Angel Watkins  MRN: [id number] DOB: Sep 27, 1960   Service Provider/Clinical Neuropsychologist: Evalene DOROTHA Riff, PsyD  Date of Service: 12/12/23 Start Time: 2 PM End Time: 3 PM Location of Service:  [facility] Physical Medicine & Rehabilitation Department [address], [place], [place] [address] Billing Code/Service: [phone]  Individuals present: Patient, Provider Laurier DOROTHA Riff, PsyD)  Provider conducted the 60-minute interactive feedback appointment in-person with the patient.  The provider reviewed and discussed the results of neuropsychological evaluation. Follow-up interviewing was conducted as needed to refine interpretation of findings as needed. Review of results included overall findings, diagnosis, and treatment planning/recommendations that were derived from integration of patient data, interpretation of standardized rest results and clinical data, and clinical decision making, which are documented in the patient's electronic medical record with the full report (date listed below). A copy of the full report will also be mailed to the patient.   The patient expressed understanding of the information reviewed. The patient was provided opportunity to ask questions which were then answered by the provider. The provider worked collaboratively to tailor treatment recommendations to the patient when possible. The patient was informed they could reach out to the provider should additional questions related to the evaluation arise.    The final neuropsychological evaluation report, documented in the patient's chart DATE 12/05/23, was amended to reflect any additional information obtained during the feedback appointment including treatment planning collaboration.    Evalene DOROTHA Riff, PsyD [facility] [address] [place], [place] [address] Main: [phone] Fax: [phone] [place] License # [id number]  This report was generated using voice recognition software. While this document has been carefully reviewed, transcription errors may be present. I apologize in advance for any inconvenience. Please contact me if further clarification is needed.  "

## 2024-01-29 ENCOUNTER — Ambulatory Visit (INDEPENDENT_AMBULATORY_CARE_PROVIDER_SITE_OTHER): Admitting: Neurology

## 2024-01-29 VITALS — BP 144/82 | HR 63 | Ht 64.0 in | Wt 357.8 lb

## 2024-01-29 DIAGNOSIS — R251 Tremor, unspecified: Secondary | ICD-10-CM | POA: Diagnosis not present

## 2024-01-29 DIAGNOSIS — R202 Paresthesia of skin: Secondary | ICD-10-CM

## 2024-01-29 DIAGNOSIS — R419 Unspecified symptoms and signs involving cognitive functions and awareness: Secondary | ICD-10-CM | POA: Diagnosis not present

## 2024-01-29 DIAGNOSIS — R200 Anesthesia of skin: Secondary | ICD-10-CM

## 2024-01-29 DIAGNOSIS — G5622 Lesion of ulnar nerve, left upper limb: Secondary | ICD-10-CM

## 2024-01-29 NOTE — Progress Notes (Signed)
 Subjective:    Patient ID: Angel Watkins is a 63 y.o. female.  HPI    Interim history:   Angel Watkins is a 63 year old female with an underlying medical history of arthritis, back pain, diabetes, lower extremity swelling, lymphedema, vitamin D  deficiency, fibromyalgia, hypertension, anxiety, palpitations, spinal stenosis and degenerative cervical spine disease with status post ACDF, mild sleep apnea (not on PAP therapy), and morbid obesity with a BMI of over 50 with status post gastric bypass surgery, who presents for evaluation of a new problem of left upper extremity ulnar neuropathy, request for nerve conduction velocity testing per neurosurgery.  The patient is unaccompanied today.  She was last seen in our clinic by Greig Forbes, NP for follow-up of her cognitive complaints.  Today, 01/29/2024: She reports a several month history of intermittent numbness and tingling affecting the left upper extremity, including digits 3 through 5, extending up to the wrist and then up to the elbow area to the ulnar groove.  She has not had any sustained weakness or sustained numbness.  She continues to have intermittent tremors affecting her hands and sometimes her mouth especially first thing in the morning.  She completed evaluation with neuropsychology.  She had neuropsychological testing on 11/28/2023, evaluation on 12/05/2023 and a follow-up appointment on 12/12/2023, and I reviewed Dr. Woodson assessment and recommendations:  <<Diagnosis: Subjective cognitive impairment Anxiety and depression.    Recommendations:  Follow up with the referring provider as needed.  Results are reassuring overall as performances are overwhelmingly average or better. The current evaluation can serve as a baseline for future comparison via re-evaluation, if beneficial.  Continue to work with medical providers for management of chronic medical conditions is recommended. Management of cardiovascular and endocrine  related conditions is important for overall physical health and poor control can impact cognitive functioning. Difficulties with chronic pain and sleep are similarly important, but primarily due to the transient impact problems in these areas can have on cognitive functioning.  Relaxation exercise handouts and tips for chronic pain management are included in the copy of the report mailed to the patient.  If desired, individual therapy can be helpful if the patient desires additional psychotherapeutic support; www.[email or web address] has a large list of community providers that can be filtered by helpful variables. Referrals within [facility] system can also be submitted as needed.  Please feel free to reach out if any questions or concerns emerge upon receipt of this report.   >>  I reviewed an office visit note from Dr. Lanis from 11/22/2023.  She has a history of chronic neck and left arm pain.  She was felt to have left upper extremity ulnar neuropathy. She had a cervical spine MRI without contrast through neurosurgery on 11/09/2023 and I reviewed the results:   IMPRESSION:  1. Prior ACDF at C5-6 with solid arthrodesis  2. Mild disc bulge at C4-5 with foraminal spurs and moderate  bilateral neural foraminal stenosis  3. Moderate degenerative disc disease and mild disc bulge at C6-7  with foraminal spurs and moderate bilateral neural foraminal  stenosis  4. Disc bulge and facet arthropathy at C7-T1 with mild spinal  stenosis and moderate bilateral neural foraminal stenosis  5. No significant change compared with February 11, 2021    She had a brain MRI with and without contrast on 11/09/2023 and I reviewed the results: Impression: Normal. In addition, I personally and independently reviewed images through the PACS system.  Her sleep study from 09/19/2023 showed mild obstructive sleep apnea.  She declined AutoPap therapy.  The patient's allergies, current medications, family history,  past medical history, past social history, past surgical history and problem list were reviewed and updated as appropriate.   Previously:   07/21/2023: (She) reports an intermittent had tremor in both hands, right more than left for over 1 year. Tremor is more noticeable when she holds something such as a cup of coffee.  She reports having fallen, she has fallen trying to get down some steps.  She usually uses a cane, has not used a walker yet.  She has a single-point cane which she has been using for the past 2 years.  She also reports difficulty with her short-term memory and name recall for the past 1+ year.  She reports that she has not had any workup through your office for her cognitive concerns.   She reports a family history of Parkinson's disease or parkinsonism affecting her father and paternal grandmother.  She had blood work with Dr. Janese recently and I reviewed test results in her electronic chart.  She was diagnosed with sleep apnea many years ago and had a CPAP machine but then had another sleep study several years ago and was told she no longer a had sleep apnea.  She does have trouble sleeping at night and takes trazodone, 50 mg at bedtime.  She has nocturia about once per average night and reports occasional morning headaches.  She has a history of snoring occasionally.  She works as an environmental health practitioner and has not had any trouble performing her job duties.  She has had some difficulty sequencing work but has not had any trouble at work.  She still drives, she has not had any trouble navigating.  She reports that she will need sedation for an MRI as she has severe claustrophobia. I reviewed your office note from 03/16/2023.  She had blood work through your office at the time including CBC, CMP, lipid panel, TSH, and vitamin D . She had a remote head CT without contrast and cervical spine CT without contrast on 06/01/2014 and I reviewed the results:   IMPRESSION: 1.  No evidence for  acute intracranial abnormality. 2. Mid cervical spondylosis. No evidence for acute cervical spine abnormality. 3. Mid cervical spinal stenosis most notable at C5-6.   She follows with pain management through [facility].  She is on [place]  patch. Her Past Medical History Is Significant For: Past Medical History:  Diagnosis Date   Anxiety    Arthritis    Back pain    Bilateral swelling of feet    Bulging lumbar disc    and cervical   Diabetes mellitus without complication (HCC)    type 2   Fibromyalgia    GERD (gastroesophageal reflux disease)    Hypertension    Insomnia    trouble staying asleep not falling asleep   Joint pain    Lymphedema    Memory loss 03/16/2023   mild per pt   Palpitations    Sleep apnea    mild - does not use CPAP   Spinal stenosis    Tremor 03/16/2023   Wears glasses     Her Past Surgical History Is Significant For: Past Surgical History:  Procedure Laterality Date   ABDOMINAL HYSTERECTOMY     ANAL FISSURE REPAIR     ANTERIOR CERVICAL DECOMP/DISCECTOMY FUSION N/A 12/03/2018   Procedure: Cervical five-six  Anterior cervical decompression/discectomy/fusion;  Surgeon: Cheryle Debby LABOR, MD;  Location: [facility] OR;  Service: Neurosurgery;  Laterality: N/A;   BUNIONECTOMY Left 2009   COLONOSCOPY  09/21/2018   x 2:  09/21/18 & 03/12/15   GASTRIC BYPASS     LUMBAR LAMINECTOMY  09/17/2021   Left L3-S1 and on 10/06/21   MRI     x 2 - 12/14/17 & 10/08/19   RADIOLOGY WITH ANESTHESIA N/A 12/14/2017   Procedure: MRI WITH ANESTHESIA, LUMBAR SPINE WITHOUT CONTRAST, CERVICAL SPINE WITHOUT CONTRAST;  Surgeon: Radiologist, Medication, MD;  Location: [facility] OR;  Service: Radiology;  Laterality: N/A;   RADIOLOGY WITH ANESTHESIA N/A 10/08/2019   Procedure: MRI WITH ANESTHESIA    LUMBAR WITHOUT;  Surgeon: Radiologist, Medication, MD;  Location: [facility] OR;  Service: Radiology;  Laterality: N/A;   RADIOLOGY WITH ANESTHESIA N/A 11/09/2023   Procedure: RADIOLOGY WITH ANESTHESIA;   Surgeon: Radiologist, Medication, MD;  Location: [facility] OR;  Service: Radiology;  Laterality: N/A;  MRI OF BRAIN WITH AND WITHOUT CONTRAST   right shoulder total replacement  04/2023   [facility]   UPPER GI ENDOSCOPY  09/13/2022    Her Family History Is Significant For: Family History  Problem Relation Age of Onset   Hypertension Mother    Cancer Mother        breast and colon   Sleep apnea Mother    Obesity Mother    Diabetes Father    Hypertension Father    Stroke Father    Parkinson's disease Father    Dementia Maternal Grandmother    Parkinson's disease Paternal Grandmother    Dementia Paternal Grandmother     Her Social History Is Significant For: Social History   Socioeconomic History   Marital status: Married    Spouse name: Not on file   Number of children: Not on file   Years of education: Not on file   Highest education level: Bachelor's degree (e.g., BA, AB, BS)  Occupational History   Occupation: Environmental Health Practitioner  Tobacco Use   Smoking status: Never   Smokeless tobacco: Never  Vaping Use   Vaping status: Never Used  Substance and Sexual Activity   Alcohol use: Yes    Alcohol/week: 1.0 standard drink of alcohol    Types: 1 Glasses of wine per week    Comment: occasional wine   Drug use: No   Sexual activity: Yes    Birth control/protection: Surgical    Comment: hysterectomy  Other Topics Concern   Not on file  Social History Narrative   Pt lives with family    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    Social Drivers of Health   Tobacco Use: Low Risk (01/29/2024)   Patient History    Smoking Tobacco Use: Never    Smokeless Tobacco Use: Never    Passive Exposure: Not on file  Financial Resource Strain: Low Risk (01/29/2024)   Overall Financial Resource Strain (CARDIA)    Difficulty of Paying Living Expenses: Not hard at all  Food Insecurity: No Food Insecurity (01/29/2024)   Epic    Worried About  Radiation Protection Practitioner of Food in the Last Year: Never true    Ran Out of Food in the Last Year: Never true  Transportation Needs: No Transportation Needs (01/29/2024)   Epic    Lack of Transportation (Medical): No    Lack of Transportation (Non-Medical): No  Physical Activity: Unknown (01/29/2024)   Exercise Vital Sign    Days of Exercise per Week: Patient declined    Minutes of Exercise per Session: Not on file  Stress: Stress Concern Present (01/29/2024)   [employer] of Occupational Health - Occupational Stress Questionnaire    Feeling of Stress: To some extent  Social Connections: Unknown (01/29/2024)   Social Connection and Isolation Panel    Frequency of Communication with Friends and Family: Three times a week    Frequency of Social Gatherings with Friends and Family: Once a week    Attends Religious Services: Patient declined    Active Member of Clubs or Organizations: Patient declined    Attends Banker Meetings: Not on file    Marital Status: Married  Depression (PHQ2-9): Medium Risk (11/02/2023)   Depression (PHQ2-9)    PHQ-2 Score: 6  Alcohol Screen: Not on file  Housing: Unknown (01/29/2024)   Epic    Unable to Pay for Housing in the Last Year: No    Number of Times Moved in the Last Year: Not on file    Homeless in the Last Year: No  Utilities: Not At Risk (05/12/2023)   Received from [facility] Utilities    Threatened with loss of utilities: No  Health Literacy: Not on file    Her Allergies Are:  Allergies[1]:   Her Current Medications Are:  Outpatient Encounter Medications as of 01/29/2024  Medication Sig   acetaminophen  (TYLENOL ) 650 MG CR tablet Take 1,300 mg by mouth every 8 (eight) hours as needed for pain.   atorvastatin  (LIPITOR) 20 MG tablet Take 1 tablet (20 mg total) by mouth daily.   buprenorphine  (BUTRANS ) 10 MCG/HR PTWK Place 1 patch onto the skin once a week. (Patient taking differently: Place 1 patch onto the  skin once a week. 15 mg)   calcium  citrate (CALCITRATE - DOSED IN MG ELEMENTAL CALCIUM ) 950 (200 Ca) MG tablet Take 200 mg by mouth daily.   Cholecalciferol (VITAMIN D3) 125 MCG (5000 UT) CAPS Take 1 capsule (5,000 Units total) by mouth daily.   cyanocobalamin  (VITAMIN B12) 1000 MCG tablet Take 1,000 mcg by mouth every other day.   diclofenac  (VOLTAREN ) 75 MG EC tablet Take 75 mg by mouth 2 (two) times daily.   ferrous sulfate 325 (65 FE) MG tablet Take 325 mg by mouth daily with breakfast.   glucose blood (PRECISION QID TEST) test strip    irbesartan  (AVAPRO ) 75 MG tablet Take 1 tablet (75 mg total) by mouth daily.   Lancets MISC    omeprazole (PRILOSEC) 20 MG capsule Take 20 mg by mouth daily.   pregabalin (LYRICA) 150 MG capsule Take 150 mg by mouth 2 (two) times daily.   Semaglutide , 1 MG/DOSE, 4 MG/3ML SOPN Inject 1 mg as directed once a week.   sertraline  (ZOLOFT ) 50 MG tablet Take 1.5 tablets (75 mg total) by mouth daily.   torsemide  (DEMADEX ) 20 MG tablet Take 20 mg by mouth daily.    traZODone (DESYREL) 50 MG tablet Take 50 mg by mouth at bedtime as needed for sleep.   No facility-administered encounter medications on file as of 01/29/2024.  :  Review of Systems:  Out of a complete 14 point review of systems, all are reviewed and negative with the exception of these symptoms as listed below:  Review of Systems  Objective:  Neurological Exam  Physical Exam Physical Examination:   Vitals:   01/29/24 0735  BP: (!) 144/82  Pulse: 63    General Examination: The patient is a very pleasant 63 y.o. female in no acute distress. She appears well-developed and well groomed.  HEENT: Normocephalic, atraumatic, pupils are reactive, corrective eyeglasses in place, hearing grossly intact.  Face is symmetric with normal facial animation, no lip, neck or jaw tremor noted, no carotid bruits.     Chest: Clear to auscultation without wheezing, rhonchi or crackles noted.   Heart:  S1+S2+0, regular and normal without murmurs, rubs or gallops noted.    Abdomen: Soft, non-tender and non-distended.   Extremities: There is significant swelling in the lower extremities bilaterally distally.    Skin: Warm and dry without trophic changes noted.    Musculoskeletal: exam reveals no obvious joint deformities but has limited range of motion in the left shoulder area as well as pain in the left arm and nonpitting puffiness noted left dorsum of hand.    Neurologically:  Mental status: The patient is awake, alert and oriented in all 4 spheres. Her immediate and remote memory, attention, language skills and fund of knowledge are quite appropriate.  Speech is clear with normal prosody and enunciation. Thought process is linear. Mood is normal and affect is normal.   Cranial nerves II - XII are as described above under HEENT exam.  Motor exam: Normal bulk, limited range of motion left shoulder, strength is overall 4+ out of 5, giveaway weakness left upper extremity noted, reports discomfort.  Reflexes about 1+ in the upper extremities and diminished in the lower extremities.  Fine motor skills and coordination: Intact grossly.   Cerebellar testing: No dysmetria or intention tremor. There is no truncal or gait ataxia.  Sensory exam: intact to light touch in the upper and lower extremities.  Gait, station and balance: She stands with difficulty and pushes herself up.  She walks with a cane.     Assessment and Plan:  In summary, Angel Watkins is a 63 year old female with an underlying medical history of arthritis, back pain, diabetes, lower extremity swelling, lymphedema, vitamin D  deficiency, fibromyalgia, hypertension, anxiety, palpitations, spinal stenosis and degenerative cervical spine disease with status post ACDF, mild sleep apnea (not on PAP therapy), and morbid obesity with a BMI of over 50 with status post gastric bypass surgery, who presents for evaluation of a new problem of  left upper extremity ulnar neuropathy, request for nerve conduction velocity testing per neurosurgery.  We will proceed with EMG testing through her office and keep her posted as to the results.  As far as her cognitive complaints, workup has been quite benign and neuropsychological evaluation was reassuring recently.  She is advised to follow-up with her PCP at this juncture and if she has ulnar neuropathy with compression at the elbow, she is advised to discuss this with her referring provider and primary care provider as far as further steps, she may benefit from seeing her orthopedic doctor that she sees for her shoulder.  She is followed by [facility].  She will follow-up in this clinic as needed.  We talked about her intermittent tremor again today.  She did not have much in the way of tremor on examination today.  She was largely reassured.  I answered all her questions today and she was in agreement with our plan.  I spent 40 minutes in total face-to-face time and in reviewing records during pre-charting, more than 50% of which was spent in counseling and coordination of care, reviewing test results, reviewing medications and treatment regimen and/or in discussing or reviewing the diagnosis of ulnar neuropathy, cognitive complaints, intermittent tremor, the prognosis and treatment options. Pertinent laboratory and imaging test results that were available  during this visit with the patient were reviewed by me and considered in my medical decision making (see chart for details).      [1]  Allergies Allergen Reactions   Adhesive [Tape] Itching and Other (See Comments)    Bruising    Furosemide Other (See Comments)    Furosemide 40mg  and 80mg  cause leg cramps   Grapeseed Extract [Nutritional Supplements] Itching    muscadine grapes cause facial itching   Mobic [Meloxicam] Swelling        Morphine Other (See Comments)    Headache   Percocet [Oxycodone -Acetaminophen ] Itching    Amlodipine Palpitations        Penicillins Itching and Rash    Has patient had a PCN reaction causing immediate rash, facial/tongue/throat swelling, SOB or lightheadedness with hypotension: No Has patient had a PCN reaction causing severe rash involving mucus membranes or skin necrosis: Yes Has patient had a PCN reaction that required hospitalization: Yes Has patient had a PCN reaction occurring within the last 10 years: No If all of the above answers are NO, then may proceed with Cephalosporin use.

## 2024-01-29 NOTE — Patient Instructions (Signed)
We will do an EMG and nerve conduction velocity test, which is an electrical nerve and muscle test, which we will schedule. We will call you with the results.  

## 2024-01-31 ENCOUNTER — Ambulatory Visit (INDEPENDENT_AMBULATORY_CARE_PROVIDER_SITE_OTHER): Admitting: Nurse Practitioner

## 2024-01-31 ENCOUNTER — Ambulatory Visit: Admitting: Occupational Therapy

## 2024-01-31 VITALS — BP 124/82 | HR 64 | Temp 98.1°F | Ht 64.0 in | Wt 357.6 lb

## 2024-01-31 DIAGNOSIS — N3941 Urge incontinence: Secondary | ICD-10-CM | POA: Diagnosis not present

## 2024-01-31 NOTE — Patient Instructions (Signed)
 Nice to see you today I will be in touch with the urine results once I have them  Follow up with me as scheduled

## 2024-01-31 NOTE — Progress Notes (Signed)
 "  Established Patient Office Visit  Subjective   Patient ID: Angel Watkins, female    DOB: 10/04/1960  Age: 63 y.o. MRN: [id number]  Chief Complaint  Patient presents with   Urinary Incontinence    Pt complains of urinary incontinence for 6 months. States that she is aware she has to urinate until she almost can't make it. Pt complains of no pain, odor, or blood.     HPI  With a history of HTN, DM2, HLD, Obeisty,   Discussed the use of AI scribe software for clinical note transcription with the patient, who gave verbal consent to proceed.  History of Present Illness Angel Watkins is a 63 year old female who presents with urinary incontinence.  She has been experiencing urinary incontinence for the past six months, characterized by a sensation of urgency. She does not feel the need to urinate until she stands up, at which point she is unsure if she will make it to the bathroom in time. If she reaches the bathroom in time, she is able to urinate normally, but if not, she experiences incontinence. No pain during urination, changes in urine odor or clarity, and she has never had a urinary tract infection.  She maintains good hydration, drinking a lot of water throughout the day. Her past medical history includes a Roux-en-Y gastric bypass. She has not previously seen a urologist for her symptoms.  In the review of symptoms, she denies fever, chills, and reports that her blood sugar levels have been well controlled, with a recent A1c of 5.8. She experiences constant lower back pain, which is managed with Butrans , but notes no new changes in this pain over the past six months.    Review of Systems  Constitutional:  Negative for chills and fever.  Respiratory:  Negative for shortness of breath.   Cardiovascular:  Negative for chest pain.  Gastrointestinal:  Negative for abdominal pain and nausea.  Genitourinary:  Positive for frequency.       Incontinence        Objective:     BP 124/82   Pulse 64   Temp 98.1 F (36.7 C) (Oral)   Ht 5' 4 (1.626 m)   Wt (!) 357 lb 9.6 oz (162.2 kg)   SpO2 98%   BMI 61.38 kg/m  BP Readings from Last 3 Encounters:  01/31/24 124/82  01/29/24 (!) 144/82  12/25/23 115/74   Wt Readings from Last 3 Encounters:  01/31/24 (!) 357 lb 9.6 oz (162.2 kg)  01/29/24 (!) 357 lb 12.8 oz (162.3 kg)  12/25/23 (!) 347 lb (157.4 kg)   SpO2 Readings from Last 3 Encounters:  01/31/24 98%  12/25/23 96%  11/30/23 100%      Physical Exam Vitals and nursing note reviewed.  Constitutional:      Appearance: Normal appearance.  Cardiovascular:     Rate and Rhythm: Normal rate and regular rhythm.     Heart sounds: Normal heart sounds.  Pulmonary:     Effort: Pulmonary effort is normal.     Breath sounds: Normal breath sounds.  Abdominal:     General: Bowel sounds are normal. There is no distension.     Palpations: There is no mass.     Tenderness: There is no abdominal tenderness. There is no right CVA tenderness or left CVA tenderness.     Hernia: No hernia is present.  Neurological:     Mental Status: She is alert.      No results found  for any visits on 01/31/24.    The ASCVD Risk score (Arnett DK, et al., 2019) failed to calculate for the following reasons:   The valid total cholesterol range is 130 to 320 mg/dL    Assessment & Plan:   Problem List Items Addressed This Visit   None Visit Diagnoses       Urge incontinence of urine    -  Primary   Relevant Orders   Urinalysis w microscopic + reflex cultur      Assessment and Plan Assessment & Plan Urge incontinence Urgency upon standing with occasional incontinence. Differential includes overactive bladder and occult UTI. A1c controlled, ruling out hyperglycemia. - Ordered urinalysis and urine culture. - Consider oxybutynin twice daily if urinalysis is normal. - Discuss potential urology referral if symptoms persist.  Type 2 diabetes  mellitus Well controlled with A1c of 5.8.  General Health Maintenance  Return if symptoms worsen or fail to improve, for As scheduled .    Adina Crandall, NP  "

## 2024-02-02 ENCOUNTER — Ambulatory Visit: Payer: Self-pay | Admitting: Nurse Practitioner

## 2024-02-02 LAB — URINALYSIS W MICROSCOPIC + REFLEX CULTURE
Glucose, UA: NEGATIVE
Hgb urine dipstick: NEGATIVE
Hyaline Cast: NONE SEEN /LPF
Ketones, ur: NEGATIVE
Nitrites, Initial: NEGATIVE
RBC / HPF: NONE SEEN /HPF (ref 0–2)
Specific Gravity, Urine: 1.039 — ABNORMAL HIGH (ref 1.001–1.035)
WBC, UA: NONE SEEN /HPF (ref 0–5)
pH: 5.5 (ref 5.0–8.0)

## 2024-02-02 LAB — URINE CULTURE
MICRO NUMBER:: 17417489
SPECIMEN QUALITY:: ADEQUATE

## 2024-02-02 LAB — CULTURE INDICATED

## 2024-02-07 ENCOUNTER — Encounter (INDEPENDENT_AMBULATORY_CARE_PROVIDER_SITE_OTHER): Payer: Self-pay | Admitting: Family Medicine

## 2024-02-07 ENCOUNTER — Ambulatory Visit: Admitting: Occupational Therapy

## 2024-02-07 ENCOUNTER — Ambulatory Visit (INDEPENDENT_AMBULATORY_CARE_PROVIDER_SITE_OTHER): Admitting: Family Medicine

## 2024-02-07 VITALS — BP 127/79 | HR 83 | Temp 98.1°F | Ht 64.0 in | Wt 345.0 lb

## 2024-02-07 DIAGNOSIS — Z6841 Body Mass Index (BMI) 40.0 and over, adult: Secondary | ICD-10-CM

## 2024-02-07 DIAGNOSIS — E1165 Type 2 diabetes mellitus with hyperglycemia: Secondary | ICD-10-CM

## 2024-02-07 DIAGNOSIS — F419 Anxiety disorder, unspecified: Secondary | ICD-10-CM | POA: Diagnosis not present

## 2024-02-07 DIAGNOSIS — Z7985 Long-term (current) use of injectable non-insulin antidiabetic drugs: Secondary | ICD-10-CM | POA: Diagnosis not present

## 2024-02-07 DIAGNOSIS — F32A Depression, unspecified: Secondary | ICD-10-CM

## 2024-02-07 DIAGNOSIS — I890 Lymphedema, not elsewhere classified: Secondary | ICD-10-CM | POA: Diagnosis not present

## 2024-02-07 MED ORDER — SERTRALINE HCL 50 MG PO TABS: 75.0000 mg | ORAL_TABLET | Freq: Every day | ORAL | 0 refills | Status: AC

## 2024-02-07 MED ORDER — SEMAGLUTIDE (1 MG/DOSE) 4 MG/3ML ~~LOC~~ SOPN: 1.0000 mg | PEN_INJECTOR | SUBCUTANEOUS | 0 refills | Status: AC

## 2024-02-07 NOTE — Assessment & Plan Note (Addendum)
 Tolerating Ozempic  without significant side effects.  Needs refill today- no change in dose

## 2024-02-07 NOTE — Assessment & Plan Note (Addendum)
 Doing well on sertraline  and not experiencing any SI/HI.  Symptoms better controlled.  Needs refill of sertraline  today at current dose.  Refill sent in today.

## 2024-02-07 NOTE — Progress Notes (Signed)
 "  SUBJECTIVE:  Chief Complaint: Obesity  Interim History: Patient had the best holiday she had since her parents passed.  She and her family celebrated with a game day which was hours of playing games which was very enjoyable.  New Years she celebrated with friends and stayed up until 2:30am.  Her friend is opening a ready to eat shop in downtown [place] and she spent time there over the holidays.  Foodwise she ate everything accept what she didn't like- didn't overdo quantity but did not deprive herself of anything. January is going to be centered around her husband cooking as he just got a big order from [employer].   Angel Watkins is here to discuss her progress with her obesity treatment plan. She is on the Category 2 Plan and states she is following her eating plan approximately 75 % of the time. She states she is exercising 20 minutes 7 times per week.   OBJECTIVE: Visit Diagnoses: Problem List Items Addressed This Visit       Endocrine   Type 2 diabetes mellitus with hyperglycemia, without long-term current use of insulin  (HCC)   Relevant Medications   Semaglutide , 1 MG/DOSE, 4 MG/3ML SOPN     Other   Anxiety and depression   Relevant Medications   sertraline  (ZOLOFT ) 50 MG tablet   Lymphedema - Primary   Morbid obesity (HCC)   Relevant Medications   Semaglutide , 1 MG/DOSE, 4 MG/3ML SOPN   Other Visit Diagnoses       BMI 50.0-59.9, adult (HCC)       Relevant Medications   Semaglutide , 1 MG/DOSE, 4 MG/3ML SOPN       Vitals Temp: 98.1 F (36.7 C) BP: 127/79 Pulse Rate: 83 SpO2: 98 %   Anthropometric Measurements Height: 5' 4 (1.626 m) Weight: (!) 345 lb (156.5 kg) BMI (Calculated): 59.19 Weight at Last Visit: 347 lb Weight Lost Since Last Visit: 2 Weight Gained Since Last Visit: 0 Starting Weight: 333 lb Total Weight Loss (lbs): 0 lb (0 kg)   Body Composition  Body Fat %: 68.1 % Fat Mass (lbs): 235 lbs Muscle Mass (lbs): 104.6 lbs Visceral Fat Rating :  30   Other Clinical Data Today's Visit #: 98 Starting Date: 04/17/20 Comments: Cat 2     ASSESSMENT AND PLAN: Assessment & Plan Lymphedema Patient has wraps that she will wear as long as she can and use her machine nightly on both legs.  No further intervention or treatment scheduled.  Follow up on symptom control at next appointment as well as frequency of her intervention. Anxiety and depression Doing well on sertraline  and not experiencing any SI/HI.  Symptoms better controlled.  Needs refill of sertraline  today at current dose.  Refill sent in today. Type 2 diabetes mellitus with hyperglycemia, without long-term current use of insulin  (HCC) Tolerating Ozempic  without significant side effects.  Needs refill today- no change in dose BMI 50.0-59.9, adult (HCC)  Morbid obesity (HCC)    Diet: Cindee is currently in the action stage of change. As such, her goal is to continue with weight loss efforts and has agreed to the Category 2 Plan.   Exercise:  For substantial health benefits, adults should do at least 150 minutes (2 hours and 30 minutes) a week of moderate-intensity, or 75 minutes (1 hour and 15 minutes) a week of vigorous-intensity aerobic physical activity, or an equivalent combination of moderate- and vigorous-intensity aerobic activity. Aerobic activity should be performed in episodes of at least 10 minutes, and  preferably, it should be spread throughout the week.  Behavior Modification:  We discussed the following Behavioral Modification Strategies today: increasing lean protein intake, decreasing simple carbohydrates, increasing vegetables, no skipping meals, and meal planning and cooking strategies.   Return in about 6 weeks (around 03/20/2024).   She was informed of the importance of frequent follow up visits to maximize her success with intensive lifestyle modifications for her multiple health conditions.  Attestation Statements:   Reviewed by clinician on day of  visit: allergies, medications, problem list, medical history, surgical history, family history, social history, and previous encounter notes.   Adelita Cho, MD "

## 2024-02-07 NOTE — Assessment & Plan Note (Addendum)
 Patient has wraps that she will wear as long as she can and use her machine nightly on both legs.  No further intervention or treatment scheduled.  Follow up on symptom control at next appointment as well as frequency of her intervention.

## 2024-02-09 ENCOUNTER — Other Ambulatory Visit (INDEPENDENT_AMBULATORY_CARE_PROVIDER_SITE_OTHER): Payer: Self-pay | Admitting: Family Medicine

## 2024-02-09 DIAGNOSIS — F419 Anxiety disorder, unspecified: Secondary | ICD-10-CM

## 2024-02-14 ENCOUNTER — Ambulatory Visit: Admitting: Occupational Therapy

## 2024-02-21 ENCOUNTER — Ambulatory Visit: Admitting: Occupational Therapy

## 2024-03-06 ENCOUNTER — Encounter: Admitting: Occupational Therapy

## 2024-03-06 LAB — HM MAMMOGRAPHY

## 2024-03-07 ENCOUNTER — Encounter: Payer: Self-pay | Admitting: Nurse Practitioner

## 2024-03-20 ENCOUNTER — Ambulatory Visit (INDEPENDENT_AMBULATORY_CARE_PROVIDER_SITE_OTHER): Admitting: Family Medicine

## 2024-03-20 ENCOUNTER — Encounter: Admitting: Occupational Therapy

## 2024-03-29 ENCOUNTER — Encounter: Admitting: Neurology

## 2024-04-03 ENCOUNTER — Encounter: Admitting: Occupational Therapy

## 2024-04-17 ENCOUNTER — Encounter: Admitting: Occupational Therapy

## 2024-05-02 ENCOUNTER — Ambulatory Visit: Admitting: Nurse Practitioner
